# Patient Record
Sex: Female | Born: 1935 | Smoking: Never smoker
Health system: Southern US, Community
[De-identification: ages and names within clinical notes are randomized; demographics above are authoritative.]

## PROBLEM LIST (undated history)

## (undated) DIAGNOSIS — R519 Headache, unspecified: Secondary | ICD-10-CM

## (undated) DIAGNOSIS — R32 Unspecified urinary incontinence: Secondary | ICD-10-CM

## (undated) DIAGNOSIS — B019 Varicella without complication: Secondary | ICD-10-CM

## (undated) DIAGNOSIS — E785 Hyperlipidemia, unspecified: Secondary | ICD-10-CM

## (undated) DIAGNOSIS — I2510 Atherosclerotic heart disease of native coronary artery without angina pectoris: Secondary | ICD-10-CM

## (undated) DIAGNOSIS — E039 Hypothyroidism, unspecified: Secondary | ICD-10-CM

## (undated) DIAGNOSIS — I779 Disorder of arteries and arterioles, unspecified: Secondary | ICD-10-CM

## (undated) DIAGNOSIS — I00 Rheumatic fever without heart involvement: Secondary | ICD-10-CM

## (undated) DIAGNOSIS — J45909 Unspecified asthma, uncomplicated: Secondary | ICD-10-CM

## (undated) DIAGNOSIS — I10 Essential (primary) hypertension: Secondary | ICD-10-CM

## (undated) DIAGNOSIS — R011 Cardiac murmur, unspecified: Secondary | ICD-10-CM

## (undated) DIAGNOSIS — K219 Gastro-esophageal reflux disease without esophagitis: Secondary | ICD-10-CM

## (undated) DIAGNOSIS — E079 Disorder of thyroid, unspecified: Secondary | ICD-10-CM

## (undated) DIAGNOSIS — R51 Headache: Secondary | ICD-10-CM

## (undated) DIAGNOSIS — I5189 Other ill-defined heart diseases: Secondary | ICD-10-CM

## (undated) HISTORY — DX: Essential (primary) hypertension: I10

## (undated) HISTORY — DX: Gastro-esophageal reflux disease without esophagitis: K21.9

## (undated) HISTORY — PX: BREAST BIOPSY: SHX20

## (undated) HISTORY — DX: Unspecified urinary incontinence: R32

## (undated) HISTORY — PX: ABDOMINAL HYSTERECTOMY: SHX81

## (undated) HISTORY — DX: Varicella without complication: B01.9

## (undated) HISTORY — DX: Disorder of arteries and arterioles, unspecified: I77.9

## (undated) HISTORY — DX: Hypothyroidism, unspecified: E03.9

## (undated) HISTORY — DX: Unspecified asthma, uncomplicated: J45.909

## (undated) HISTORY — DX: Atherosclerotic heart disease of native coronary artery without angina pectoris: I25.10

## (undated) HISTORY — DX: Cardiac murmur, unspecified: R01.1

## (undated) HISTORY — DX: Headache: R51

## (undated) HISTORY — DX: Disorder of thyroid, unspecified: E07.9

## (undated) HISTORY — DX: Other ill-defined heart diseases: I51.89

## (undated) HISTORY — DX: Hyperlipidemia, unspecified: E78.5

## (undated) HISTORY — DX: Rheumatic fever without heart involvement: I00

## (undated) HISTORY — DX: Headache, unspecified: R51.9

## (undated) HISTORY — PX: APPENDECTOMY: SHX54

---

## 1979-04-14 HISTORY — PX: MASTECTOMY: SHX3

## 2016-04-03 DIAGNOSIS — R7301 Impaired fasting glucose: Secondary | ICD-10-CM | POA: Diagnosis not present

## 2016-04-03 DIAGNOSIS — Z79899 Other long term (current) drug therapy: Secondary | ICD-10-CM | POA: Diagnosis not present

## 2016-04-03 DIAGNOSIS — E039 Hypothyroidism, unspecified: Secondary | ICD-10-CM | POA: Diagnosis not present

## 2016-04-03 DIAGNOSIS — Z789 Other specified health status: Secondary | ICD-10-CM | POA: Diagnosis not present

## 2016-04-03 DIAGNOSIS — J209 Acute bronchitis, unspecified: Secondary | ICD-10-CM | POA: Diagnosis not present

## 2016-04-03 DIAGNOSIS — N289 Disorder of kidney and ureter, unspecified: Secondary | ICD-10-CM | POA: Diagnosis not present

## 2016-04-03 DIAGNOSIS — E785 Hyperlipidemia, unspecified: Secondary | ICD-10-CM | POA: Diagnosis not present

## 2016-04-03 DIAGNOSIS — Z0000 Encounter for general adult medical examination without abnormal findings: Secondary | ICD-10-CM | POA: Diagnosis not present

## 2016-05-08 DIAGNOSIS — K219 Gastro-esophageal reflux disease without esophagitis: Secondary | ICD-10-CM | POA: Diagnosis not present

## 2016-05-08 DIAGNOSIS — I6523 Occlusion and stenosis of bilateral carotid arteries: Secondary | ICD-10-CM | POA: Diagnosis not present

## 2016-05-08 DIAGNOSIS — M542 Cervicalgia: Secondary | ICD-10-CM | POA: Diagnosis not present

## 2016-05-08 DIAGNOSIS — Z23 Encounter for immunization: Secondary | ICD-10-CM | POA: Diagnosis not present

## 2016-05-08 DIAGNOSIS — E039 Hypothyroidism, unspecified: Secondary | ICD-10-CM | POA: Diagnosis not present

## 2016-05-08 DIAGNOSIS — Z0001 Encounter for general adult medical examination with abnormal findings: Secondary | ICD-10-CM | POA: Diagnosis not present

## 2016-05-08 DIAGNOSIS — E785 Hyperlipidemia, unspecified: Secondary | ICD-10-CM | POA: Diagnosis not present

## 2016-05-08 DIAGNOSIS — Z6829 Body mass index (BMI) 29.0-29.9, adult: Secondary | ICD-10-CM | POA: Diagnosis not present

## 2016-05-08 DIAGNOSIS — E663 Overweight: Secondary | ICD-10-CM | POA: Diagnosis not present

## 2016-05-08 DIAGNOSIS — Z1389 Encounter for screening for other disorder: Secondary | ICD-10-CM | POA: Diagnosis not present

## 2016-05-10 DIAGNOSIS — Z85828 Personal history of other malignant neoplasm of skin: Secondary | ICD-10-CM | POA: Diagnosis not present

## 2016-05-10 DIAGNOSIS — L720 Epidermal cyst: Secondary | ICD-10-CM | POA: Diagnosis not present

## 2016-05-10 DIAGNOSIS — L578 Other skin changes due to chronic exposure to nonionizing radiation: Secondary | ICD-10-CM | POA: Diagnosis not present

## 2016-05-10 DIAGNOSIS — D225 Melanocytic nevi of trunk: Secondary | ICD-10-CM | POA: Diagnosis not present

## 2016-05-10 DIAGNOSIS — L820 Inflamed seborrheic keratosis: Secondary | ICD-10-CM | POA: Diagnosis not present

## 2016-05-10 DIAGNOSIS — L570 Actinic keratosis: Secondary | ICD-10-CM | POA: Diagnosis not present

## 2016-07-18 DIAGNOSIS — I6521 Occlusion and stenosis of right carotid artery: Secondary | ICD-10-CM | POA: Diagnosis not present

## 2016-07-19 DIAGNOSIS — I6523 Occlusion and stenosis of bilateral carotid arteries: Secondary | ICD-10-CM | POA: Diagnosis not present

## 2016-10-03 DIAGNOSIS — R05 Cough: Secondary | ICD-10-CM | POA: Diagnosis not present

## 2016-10-03 DIAGNOSIS — F329 Major depressive disorder, single episode, unspecified: Secondary | ICD-10-CM | POA: Diagnosis not present

## 2016-10-03 DIAGNOSIS — E039 Hypothyroidism, unspecified: Secondary | ICD-10-CM | POA: Diagnosis not present

## 2016-10-03 DIAGNOSIS — E785 Hyperlipidemia, unspecified: Secondary | ICD-10-CM | POA: Diagnosis not present

## 2016-10-03 DIAGNOSIS — K219 Gastro-esophageal reflux disease without esophagitis: Secondary | ICD-10-CM | POA: Diagnosis not present

## 2016-10-13 DIAGNOSIS — J449 Chronic obstructive pulmonary disease, unspecified: Secondary | ICD-10-CM | POA: Diagnosis not present

## 2016-11-22 DIAGNOSIS — R0600 Dyspnea, unspecified: Secondary | ICD-10-CM | POA: Diagnosis not present

## 2016-11-22 DIAGNOSIS — R05 Cough: Secondary | ICD-10-CM | POA: Diagnosis not present

## 2016-12-08 DIAGNOSIS — L578 Other skin changes due to chronic exposure to nonionizing radiation: Secondary | ICD-10-CM | POA: Diagnosis not present

## 2016-12-08 DIAGNOSIS — L814 Other melanin hyperpigmentation: Secondary | ICD-10-CM | POA: Diagnosis not present

## 2016-12-08 DIAGNOSIS — D229 Melanocytic nevi, unspecified: Secondary | ICD-10-CM | POA: Diagnosis not present

## 2016-12-08 DIAGNOSIS — L821 Other seborrheic keratosis: Secondary | ICD-10-CM | POA: Diagnosis not present

## 2017-02-15 DIAGNOSIS — R05 Cough: Secondary | ICD-10-CM | POA: Diagnosis not present

## 2017-02-16 DIAGNOSIS — R05 Cough: Secondary | ICD-10-CM | POA: Diagnosis not present

## 2017-02-22 DIAGNOSIS — R112 Nausea with vomiting, unspecified: Secondary | ICD-10-CM | POA: Diagnosis not present

## 2017-02-22 DIAGNOSIS — I10 Essential (primary) hypertension: Secondary | ICD-10-CM | POA: Diagnosis not present

## 2017-02-22 DIAGNOSIS — R05 Cough: Secondary | ICD-10-CM | POA: Diagnosis not present

## 2017-02-27 DIAGNOSIS — K219 Gastro-esophageal reflux disease without esophagitis: Secondary | ICD-10-CM | POA: Diagnosis not present

## 2017-02-27 DIAGNOSIS — Z6829 Body mass index (BMI) 29.0-29.9, adult: Secondary | ICD-10-CM | POA: Diagnosis not present

## 2017-02-27 DIAGNOSIS — E663 Overweight: Secondary | ICD-10-CM | POA: Diagnosis not present

## 2017-02-27 DIAGNOSIS — I10 Essential (primary) hypertension: Secondary | ICD-10-CM | POA: Diagnosis not present

## 2017-03-30 DIAGNOSIS — I10 Essential (primary) hypertension: Secondary | ICD-10-CM | POA: Diagnosis not present

## 2017-03-30 DIAGNOSIS — E669 Obesity, unspecified: Secondary | ICD-10-CM | POA: Diagnosis not present

## 2017-03-30 DIAGNOSIS — Z6830 Body mass index (BMI) 30.0-30.9, adult: Secondary | ICD-10-CM | POA: Diagnosis not present

## 2017-04-04 DIAGNOSIS — Z0000 Encounter for general adult medical examination without abnormal findings: Secondary | ICD-10-CM | POA: Diagnosis not present

## 2017-04-04 DIAGNOSIS — E785 Hyperlipidemia, unspecified: Secondary | ICD-10-CM | POA: Diagnosis not present

## 2017-04-04 DIAGNOSIS — I10 Essential (primary) hypertension: Secondary | ICD-10-CM | POA: Diagnosis not present

## 2017-04-04 DIAGNOSIS — E039 Hypothyroidism, unspecified: Secondary | ICD-10-CM | POA: Diagnosis not present

## 2017-04-04 DIAGNOSIS — N289 Disorder of kidney and ureter, unspecified: Secondary | ICD-10-CM | POA: Diagnosis not present

## 2017-05-10 DIAGNOSIS — E785 Hyperlipidemia, unspecified: Secondary | ICD-10-CM | POA: Diagnosis not present

## 2017-05-10 DIAGNOSIS — I10 Essential (primary) hypertension: Secondary | ICD-10-CM | POA: Diagnosis not present

## 2017-05-10 DIAGNOSIS — E039 Hypothyroidism, unspecified: Secondary | ICD-10-CM | POA: Diagnosis not present

## 2017-05-10 DIAGNOSIS — Z0000 Encounter for general adult medical examination without abnormal findings: Secondary | ICD-10-CM | POA: Diagnosis not present

## 2017-12-10 ENCOUNTER — Ambulatory Visit (INDEPENDENT_AMBULATORY_CARE_PROVIDER_SITE_OTHER): Payer: Medicare Other | Admitting: Family Medicine

## 2017-12-10 ENCOUNTER — Ambulatory Visit (INDEPENDENT_AMBULATORY_CARE_PROVIDER_SITE_OTHER): Payer: Medicare Other

## 2017-12-10 ENCOUNTER — Encounter: Payer: Self-pay | Admitting: Family Medicine

## 2017-12-10 VITALS — BP 154/90 | HR 64 | Temp 98.4°F | Ht 65.5 in | Wt 178.4 lb

## 2017-12-10 DIAGNOSIS — K219 Gastro-esophageal reflux disease without esophagitis: Secondary | ICD-10-CM | POA: Diagnosis not present

## 2017-12-10 DIAGNOSIS — I10 Essential (primary) hypertension: Secondary | ICD-10-CM | POA: Diagnosis not present

## 2017-12-10 DIAGNOSIS — M25562 Pain in left knee: Secondary | ICD-10-CM

## 2017-12-10 DIAGNOSIS — E785 Hyperlipidemia, unspecified: Secondary | ICD-10-CM | POA: Diagnosis not present

## 2017-12-10 DIAGNOSIS — Z23 Encounter for immunization: Secondary | ICD-10-CM

## 2017-12-10 DIAGNOSIS — G8929 Other chronic pain: Secondary | ICD-10-CM | POA: Diagnosis not present

## 2017-12-10 DIAGNOSIS — E039 Hypothyroidism, unspecified: Secondary | ICD-10-CM

## 2017-12-10 DIAGNOSIS — M1712 Unilateral primary osteoarthritis, left knee: Secondary | ICD-10-CM | POA: Diagnosis not present

## 2017-12-10 LAB — COMPREHENSIVE METABOLIC PANEL
ALK PHOS: 61 U/L (ref 39–117)
ALT: 27 U/L (ref 0–35)
AST: 37 U/L (ref 0–37)
Albumin: 4.4 g/dL (ref 3.5–5.2)
BUN: 18 mg/dL (ref 6–23)
CO2: 30 mEq/L (ref 19–32)
CREATININE: 0.86 mg/dL (ref 0.40–1.20)
Calcium: 10.1 mg/dL (ref 8.4–10.5)
Chloride: 102 mEq/L (ref 96–112)
GFR: 67.05 mL/min (ref >60.00)
GLUCOSE: 99 mg/dL (ref 70–99)
POTASSIUM: 4.4 meq/L (ref 3.5–5.1)
SODIUM: 140 meq/L (ref 135–145)
TOTAL PROTEIN: 7.3 g/dL (ref 6.0–8.3)
Total Bilirubin: 0.7 mg/dL (ref 0.2–1.2)

## 2017-12-10 LAB — CBC WITH DIFFERENTIAL/PLATELET
BASOS ABS: 0 10*3/uL (ref 0.0–0.1)
Basophils Relative: 0.9 % (ref 0.0–3.0)
Eosinophils Absolute: 0.4 10*3/uL (ref 0.0–0.7)
Eosinophils Relative: 6.8 % — ABNORMAL HIGH (ref 0.0–5.0)
HCT: 46.8 % — ABNORMAL HIGH (ref 36.0–46.0)
HEMOGLOBIN: 15.8 g/dL — AB (ref 12.0–15.0)
LYMPHS PCT: 30.9 % (ref 12.0–46.0)
Lymphs Abs: 1.7 10*3/uL (ref 0.7–4.0)
MCHC: 33.8 g/dL (ref 30.0–36.0)
MCV: 88 fl (ref 78.0–100.0)
MONO ABS: 0.4 10*3/uL (ref 0.1–1.0)
Monocytes Relative: 6.9 % (ref 3.0–12.0)
Neutro Abs: 2.9 10*3/uL (ref 1.4–7.7)
Neutrophils Relative %: 54.5 % (ref 43.0–77.0)
Platelets: 201 10*3/uL (ref 150.0–400.0)
RBC: 5.31 Mil/uL — AB (ref 3.87–5.11)
RDW: 13.3 % (ref 11.5–15.5)
WBC: 5.4 10*3/uL (ref 4.0–10.5)

## 2017-12-10 LAB — LIPID PANEL
CHOL/HDL RATIO: 3
Cholesterol: 189 mg/dL (ref 0–200)
HDL: 58.2 mg/dL (ref >39.00)
NonHDL: 130.59
Triglycerides: 204 mg/dL — ABNORMAL HIGH (ref 0.0–149.0)
VLDL: 40.8 mg/dL — AB (ref 0.0–40.0)

## 2017-12-10 LAB — TSH: TSH: 1.95 u[IU]/mL (ref 0.35–4.50)

## 2017-12-10 LAB — LDL CHOLESTEROL, DIRECT: Direct LDL: 103 mg/dL

## 2017-12-10 MED ORDER — DICLOFENAC SODIUM 1 % TD GEL: 2.0000 g | Freq: Four times a day (QID) | TRANSDERMAL | 2 refills | Status: DC

## 2017-12-10 MED ORDER — RABEPRAZOLE SODIUM 20 MG PO TBEC: 20.0000 mg | DELAYED_RELEASE_TABLET | Freq: Every day | ORAL | 3 refills | Status: DC

## 2017-12-10 MED ORDER — LOSARTAN POTASSIUM 50 MG PO TABS: 50.0000 mg | ORAL_TABLET | Freq: Every day | ORAL | 3 refills | Status: DC

## 2017-12-10 MED ORDER — TRAMADOL HCL 50 MG PO TABS: 50.0000 mg | ORAL_TABLET | Freq: Four times a day (QID) | ORAL | 0 refills | Status: DC | PRN

## 2017-12-10 MED ORDER — ASPIRIN EC 325 MG PO TBEC: 325.0000 mg | DELAYED_RELEASE_TABLET | Freq: Every day | ORAL | 3 refills | Status: DC

## 2017-12-10 MED ORDER — FENOFIBRATE 160 MG PO TABS: 160.0000 mg | ORAL_TABLET | Freq: Every day | ORAL | 3 refills | Status: DC

## 2017-12-10 MED ORDER — LEVOTHYROXINE SODIUM 75 MCG PO TABS: 75.0000 ug | ORAL_TABLET | Freq: Every day | ORAL | 3 refills | Status: DC

## 2017-12-10 NOTE — Progress Notes (Signed)
Subjective:    Patient ID: Christine [name], female    DOB: September 03, 1933, 84 y.o.   MRN: [id number]  HPI   Patient presents to clinic to establish primary care with new PCP.  She recently moved to area from [place] to be closer to her family.  She has a past medical history of hypothyroidism, hyperlipidemia, GERD, hypertension.  Needs all medications refilled.  Patient's medical history, social history, surgical history, family history reviewed and updated accordingly in our chart.  Patient's main concern today is aching left knee pain, states at times it is so severe she cannot get good sleep.  Also would like her flu vaccine today.  Patient Active Problem List   Diagnosis Date Noted  . Chronic pain of left knee 12/10/2017  . Essential hypertension 12/10/2017  . Acquired hypothyroidism 12/10/2017  . GERD without esophagitis 12/10/2017  . Hyperlipidemia 12/10/2017   Social History   Tobacco Use  . Smoking status: Never Smoker  . Smokeless tobacco: Never Used  Substance Use Topics  . Alcohol use: Not on file   Family History  Problem Relation Age of Onset  . Early death Mother   . Kidney disease Mother   . Cancer Father   . Heart disease Brother   . Cancer Daughter    Past Surgical History:  Procedure Laterality Date  . ABDOMINAL HYSTERECTOMY    . APPENDECTOMY    . BREAST BIOPSY      Review of Systems  Constitutional: Negative for chills, fatigue and fever.  HENT: Negative for congestion, ear pain, sinus pain and sore throat.   Eyes: Negative.   Respiratory: Negative for cough, shortness of breath and wheezing.   Cardiovascular: Negative for chest pain, palpitations and leg swelling.  Gastrointestinal: Negative for abdominal pain, diarrhea, nausea and vomiting.  Genitourinary: Negative for dysuria, frequency and urgency.  Musculoskeletal: left knee pain Skin: Negative for color change, pallor and rash.  Neurological: Negative for syncope,  light-headedness and headaches.  Psychiatric/Behavioral: The patient is not nervous/anxious.       Objective:   Physical Exam  Constitutional: She is oriented to person, place, and time. She appears well-nourished. No distress.  HENT:  Head: Normocephalic and atraumatic.  Eyes: Pupils are equal, round, and reactive to light. Conjunctivae and EOM are normal. No scleral icterus.  Neck: Normal range of motion. Neck supple. No JVD present. No tracheal deviation present.  Cardiovascular: Normal rate and regular rhythm. Exam reveals no gallop and no friction rub.  No murmur heard. Pulmonary/Chest: Effort normal and breath sounds normal.  Musculoskeletal: Normal range of motion. She exhibits tenderness. She exhibits no edema or deformity.  Left knee pain. ROM knee intact - can extend and bend without issues. Negative anterior/posterior drawer test. No visible swelling of knee.   Neurological: She is alert and oriented to person, place, and time.  Skin: Skin is warm and dry. No erythema. No pallor.  Psychiatric: She has a normal mood and affect. Her behavior is normal. Thought content normal.  Nursing note and vitals reviewed.   Vitals:   12/10/17 1016 12/10/17 1050  BP: (!) 170/102 (!) 154/90  Pulse: 64   Temp: 98.4 F (36.9 C)   SpO2: 96%       Assessment & Plan:   Left knee pain-overall get x-ray to further evaluate.  I suspect the pain is related to arthritis.  Patient will continue to use Tylenol as needed for pain.  Patient also given Voltaren gel to rub on  knee to see if this helps soothe pain.  Tramadol 50 mg given to use when needed for moderate to severe pain.  Essential hypertension- patient will continue losartan 50 mg.  We will get CBC and CMP labs drawn today.  Hypothyroidism-she will continue levothyroxine 75 mcg.  We will get new TSH level drawn today.  GERD- patient will continue AcipHex 20 mg.  Hyperlipidemia- patient will continue fenofibrate 160 mg once daily.  New  lipid panel drawn in clinic today.  Flu vaccine given in clinic.  Follow up in 3 months for management of chronic conditions. Once we have knee xray results, we can consider orthopedic referral if arthritis is severe.

## 2017-12-31 ENCOUNTER — Other Ambulatory Visit: Payer: Self-pay | Admitting: Family Medicine

## 2017-12-31 ENCOUNTER — Ambulatory Visit (INDEPENDENT_AMBULATORY_CARE_PROVIDER_SITE_OTHER): Payer: Medicare Other | Admitting: Family Medicine

## 2017-12-31 ENCOUNTER — Encounter: Payer: Self-pay | Admitting: Family Medicine

## 2017-12-31 VITALS — BP 138/66 | HR 73 | Temp 97.8°F | Ht 65.0 in | Wt 169.8 lb

## 2017-12-31 DIAGNOSIS — R519 Headache, unspecified: Secondary | ICD-10-CM

## 2017-12-31 DIAGNOSIS — R0982 Postnasal drip: Secondary | ICD-10-CM

## 2017-12-31 DIAGNOSIS — K219 Gastro-esophageal reflux disease without esophagitis: Secondary | ICD-10-CM | POA: Diagnosis not present

## 2017-12-31 DIAGNOSIS — R142 Eructation: Secondary | ICD-10-CM | POA: Diagnosis not present

## 2017-12-31 DIAGNOSIS — R5383 Other fatigue: Secondary | ICD-10-CM

## 2017-12-31 DIAGNOSIS — R51 Headache: Secondary | ICD-10-CM

## 2017-12-31 MED ORDER — LORATADINE 10 MG PO TABS: 10.0000 mg | ORAL_TABLET | Freq: Every day | ORAL | 1 refills | Status: DC

## 2017-12-31 MED ORDER — OMEPRAZOLE 40 MG PO CPDR: 40.0000 mg | DELAYED_RELEASE_CAPSULE | Freq: Every day | ORAL | 1 refills | Status: DC

## 2017-12-31 MED ORDER — FAMOTIDINE 20 MG PO TABS: 20.0000 mg | ORAL_TABLET | Freq: Every day | ORAL | 1 refills | Status: DC

## 2017-12-31 NOTE — Progress Notes (Signed)
Subjective:    Patient ID: Christine [name], female    DOB: 1933-07-22, 84 y.o.   MRN: [id number]  HPI   Patient presents to clinic c/o burping, headache (fullness, pressure in front of head) for past 2 weeks.   She has taken aciphex for years due to hiatal hernia and GERD. States her diet has not changed. Denies any feelings of burning in chest, just increased burping.   States she will use a tylenol and ice - pack for her headache, which does help some. She has also been sniffling more often.   Most recent lab work done 12/10/17 was unremarkable for abnormality -- labs done included cbc, cmp, lipid panel, TSH.  Patient Active Problem List   Diagnosis Date Noted  . Chronic pain of left knee 12/10/2017  . Essential hypertension 12/10/2017  . Acquired hypothyroidism 12/10/2017  . GERD without esophagitis 12/10/2017  . Hyperlipidemia 12/10/2017   Social History   Tobacco Use  . Smoking status: Never Smoker  . Smokeless tobacco: Never Used  Substance Use Topics  . Alcohol use: Yes    Comment: seldom   Review of Systems   Constitutional: +fatigue. Negative for chills, and fever.  HENT: Negative for congestion, ear pain, sinus pain and sore throat.   Eyes: Negative.   Respiratory: Negative for cough, shortness of breath and wheezing.   Cardiovascular: Negative for chest pain, palpitations and leg swelling.  Gastrointestinal: Negative for abdominal pain, diarrhea, nausea and vomiting. +increased burping.  Genitourinary: Negative for dysuria, frequency and urgency.  Musculoskeletal: Negative for arthralgias and myalgias.  Skin: Negative for color change, pallor and rash.  Neurological: Negative for syncope, light-headedness. +headache Psychiatric/Behavioral: The patient is not nervous/anxious.       Objective:   Physical Exam  Constitutional: She is oriented to person, place, and time. She appears well-nourished. No distress.  HENT:  Head: Normocephalic and atraumatic.    Nose: Mucosal edema and rhinorrhea present.  +fullness bilateral TMs. +post nasal drip. +clear nasal drainage.   Eyes: EOM are normal. No scleral icterus.  Neck: Neck supple. No JVD present. No tracheal deviation present.  Cardiovascular: Normal rate, regular rhythm and normal heart sounds.  Pulmonary/Chest: Effort normal and breath sounds normal. No respiratory distress. She has no wheezes. She has no rales.  Abdominal: Soft. Bowel sounds are normal. She exhibits no distension. There is no tenderness.  Musculoskeletal: She exhibits no edema.  Gait normal.   Lymphadenopathy:    She has no cervical adenopathy.  Neurological: She is alert and oriented to person, place, and time.  Skin: Skin is warm and dry. No pallor.  Psychiatric: She has a normal mood and affect. Her behavior is normal.  Nursing note and vitals reviewed.     Vitals:   12/31/17 1059  BP: 138/66  Pulse: 73  Temp: 97.8 F (36.6 C)  SpO2: 96%   Assessment & Plan:   Belching/GERD -- We will stop aciphex. We will change to omeprazole 40mg  in AM AND Pepcid 20mg  in the PM. If this does not help, we will proceed with GI referral.   Post nasal drip/sinus headache -- due to postnasal drip seen on exam and ear fullness bilaterally I suspect headache is related to a sinus headache.  Patient advised she can continue use Tylenol ice pack as needed.  Also she will start loratadine 10 mg once daily to help with congestion.  If headache continues to persist even with addition of loratadine, we discussed options of getting imaging  of brain.  Fatigue - most recent lab work done at the end of September was unremarkable.  CBC, CMP, thyroid panel were all good.  Fatigue could be related to postnasal drip/sinus headache.  If fatigue persists even after treatment of these things, we will do more lab work including vitamin levels to further evaluate.  Patient will follow-up here in 2 to 4 weeks for recheck after starting these new  medications.  Patient advised she can call clinic at any time if issues arise.

## 2017-12-31 NOTE — Patient Instructions (Signed)

## 2018-01-18 ENCOUNTER — Ambulatory Visit (INDEPENDENT_AMBULATORY_CARE_PROVIDER_SITE_OTHER): Payer: Medicare Other | Admitting: Family Medicine

## 2018-01-18 ENCOUNTER — Encounter: Payer: Self-pay | Admitting: Family Medicine

## 2018-01-18 ENCOUNTER — Other Ambulatory Visit: Payer: Self-pay | Admitting: Family Medicine

## 2018-01-18 VITALS — BP 158/82 | HR 67 | Temp 97.9°F | Ht 65.0 in | Wt 174.4 lb

## 2018-01-18 DIAGNOSIS — I10 Essential (primary) hypertension: Secondary | ICD-10-CM

## 2018-01-18 DIAGNOSIS — R0982 Postnasal drip: Secondary | ICD-10-CM | POA: Diagnosis not present

## 2018-01-18 DIAGNOSIS — K219 Gastro-esophageal reflux disease without esophagitis: Secondary | ICD-10-CM | POA: Diagnosis not present

## 2018-01-18 DIAGNOSIS — R110 Nausea: Secondary | ICD-10-CM | POA: Diagnosis not present

## 2018-01-18 DIAGNOSIS — R51 Headache: Secondary | ICD-10-CM

## 2018-01-18 DIAGNOSIS — R142 Eructation: Secondary | ICD-10-CM | POA: Diagnosis not present

## 2018-01-18 DIAGNOSIS — R519 Headache, unspecified: Secondary | ICD-10-CM

## 2018-01-18 MED ORDER — ONDANSETRON 4 MG PO TBDP: 4.0000 mg | ORAL_TABLET | Freq: Three times a day (TID) | ORAL | 1 refills | Status: DC | PRN

## 2018-01-18 MED ORDER — LOSARTAN POTASSIUM 100 MG PO TABS: 100.0000 mg | ORAL_TABLET | Freq: Every day | ORAL | 1 refills | Status: DC

## 2018-01-18 NOTE — Progress Notes (Signed)
   Subjective:    Patient ID: Christine [name], female    DOB: Oct 30, 1933, 84 y.o.   MRN: [id number]  HPI  Presents to clinic for 2 week follow up after changing medications for GERD to pepcid and omeprazole due to increased belching and adding loratadine 10 to treat runny nose/sinus headache.  Patient states the headache and runny nose has improved with addition of Claritin, states she is feeling much better in that regard.  Belching improved, However she continues to have episodes of nausea even with medication changes for her GERD treatment.   Patient also has been monitoring BP at home, and has been running on the higher end in the 150s over 90s.  Currently she takes losartan 50 mg once daily for blood pressure control.  Patient Active Problem List   Diagnosis Date Noted  . Chronic pain of left knee 12/10/2017  . Essential hypertension 12/10/2017  . Acquired hypothyroidism 12/10/2017  . GERD without esophagitis 12/10/2017  . Hyperlipidemia 12/10/2017   Social History   Tobacco Use  . Smoking status: Never Smoker  . Smokeless tobacco: Never Used  Substance Use Topics  . Alcohol use: Yes    Comment: seldom   Review of Systems  Constitutional: Negative for chills, fatigue and fever.  HENT: Negative for congestion, ear pain, sinus pain and sore throat.   Eyes: Negative.   Respiratory: Negative for cough, shortness of breath and wheezing.   Cardiovascular: Negative for chest pain, palpitations and leg swelling.  Gastrointestinal: +nausea. Negative for abdominal pain, diarrhea, and vomiting.  Genitourinary: Negative for dysuria, frequency and urgency.  Musculoskeletal: Negative for arthralgias and myalgias.  Skin: Negative for color change, pallor and rash.  Neurological: Negative for syncope, light-headedness and headaches.  Psychiatric/Behavioral: The patient is not nervous/anxious.       Objective:   Physical Exam  Constitutional: She appears well-developed and  well-nourished. No distress.  Head: Normocephalic and atraumatic.  Eyes: EOM are normal. No scleral icterus.  Neck: Normal range of motion. Neck supple. No tracheal deviation present.  Cardiovascular: Normal rate, regular rhythm and normal heart sounds. No LE swelling. Pulmonary/Chest: Effort normal and breath sounds normal. No respiratory distress. She has no wheezes. She has no rales.  Abdominal: Soft. Bowel sounds are normal. Mild epigastric tenderness.  Neurological: She is alert and oriented to person, place, and time.  Gait normal  Skin: Skin is warm and dry. No pallor.  Psychiatric: She has a normal mood and affect. Her behavior is normal. Thought content normal.   Nursing note and vitals reviewed.    Vitals:   01/18/18 1055  BP: (!) 158/82  Pulse: 67  Temp: 97.9 F (36.6 C)  SpO2: 94%   Assessment & Plan:   GERD/nausea/belching - patient will continue omeprazole 40 mg daily and Pepcid 20 mg daily.  We will also do referral to GI due to continued issues with GERD and nausea symptoms.  Patient given Zofran ODT to use as needed if she has nausea, patient aware this medication dissolves under the tongue.  Sinus headache/postnasal drip- patient will continue Claritin, this has been helpful to try postnasal drip symptoms.  Hypertension - blood pressures have been trending high, we will increase losartan 100 mg/day.  Patient will return to clinic in 2 weeks for nurse visit on blood pressure to see if increase in losartan was effective.  If increased dose is effective, we will send prescription to her mail away pharmacy.

## 2018-01-25 ENCOUNTER — Encounter: Payer: Self-pay | Admitting: Gastroenterology

## 2018-01-31 ENCOUNTER — Ambulatory Visit (INDEPENDENT_AMBULATORY_CARE_PROVIDER_SITE_OTHER): Payer: Medicare Other | Admitting: *Deleted

## 2018-01-31 VITALS — BP 146/84 | HR 66 | Resp 18

## 2018-01-31 DIAGNOSIS — I10 Essential (primary) hypertension: Secondary | ICD-10-CM | POA: Diagnosis not present

## 2018-01-31 MED ORDER — AMLODIPINE BESYLATE 5 MG PO TABS: 5.0000 mg | ORAL_TABLET | Freq: Every day | ORAL | 0 refills | Status: DC

## 2018-01-31 NOTE — Progress Notes (Addendum)
Reviewed. BP very minimally improved.   We will add amlodipine 5 mg to medication regime and please schedule her for follow up in 7-10 days as an office visit with me.  Thanks  Philis Nettle FNP

## 2018-01-31 NOTE — Progress Notes (Signed)
Patient here for nurse visit BP check per order from 01/18/18   Patient reports compliance with prescribed BP medications: yes losartan 100 mg   Last dose of BP medication: 0745  BP Readings from Last 3 Encounters:  01/31/18 (!) 150/74  01/18/18 (!) 158/82  12/31/17 138/66   Pulse Readings from Last 3 Encounters:  01/31/18 64  01/18/18 67  12/31/17 73    .   Kerin Salen, LPN

## 2018-01-31 NOTE — Addendum Note (Signed)
Addended by: Philis Nettle on: 01/31/2018 04:58 PM   Modules accepted: Orders

## 2018-02-05 ENCOUNTER — Telehealth: Payer: Self-pay

## 2018-02-05 NOTE — Progress Notes (Signed)
See phone note dated 02/05/18

## 2018-02-05 NOTE — Telephone Encounter (Signed)
Patient is leaving for [facility] Saturday and will not be back in town until 02/24/18, scheduled Follow up for 02/25/18 and advised patient to just periodically monitor pressure and advise office any problems.

## 2018-02-05 NOTE — Telephone Encounter (Signed)
I did not call her, maybe Juliann Pulse did? In regards to her new BP medication from BP visit check last week

## 2018-02-05 NOTE — Telephone Encounter (Signed)
Copied from [place] [phone]. Topic: General - Call Back - No Documentation >> Feb 05, 2018  2:28 PM Reyne Dumas L wrote: Reason for CRM:  Pt states that she missed a call from Philis Nettle, that just stated to call back.

## 2018-02-05 NOTE — Progress Notes (Signed)
Called patient left message for patient to please call office.

## 2018-02-05 NOTE — Telephone Encounter (Signed)
Did she pick up her new BP med amlodipine?

## 2018-02-06 NOTE — Telephone Encounter (Signed)
Yes she is going to .

## 2018-02-25 ENCOUNTER — Other Ambulatory Visit: Payer: Self-pay | Admitting: Family Medicine

## 2018-02-25 ENCOUNTER — Encounter: Payer: Self-pay | Admitting: Family Medicine

## 2018-02-25 ENCOUNTER — Ambulatory Visit (INDEPENDENT_AMBULATORY_CARE_PROVIDER_SITE_OTHER): Payer: Medicare Other | Admitting: Family Medicine

## 2018-02-25 VITALS — BP 156/84 | HR 64 | Temp 97.5°F | Ht 65.0 in | Wt 177.6 lb

## 2018-02-25 DIAGNOSIS — M1712 Unilateral primary osteoarthritis, left knee: Secondary | ICD-10-CM | POA: Diagnosis not present

## 2018-02-25 DIAGNOSIS — G8929 Other chronic pain: Secondary | ICD-10-CM

## 2018-02-25 DIAGNOSIS — I10 Essential (primary) hypertension: Secondary | ICD-10-CM | POA: Diagnosis not present

## 2018-02-25 DIAGNOSIS — M25562 Pain in left knee: Secondary | ICD-10-CM

## 2018-02-25 LAB — BASIC METABOLIC PANEL
BUN: 26 mg/dL — ABNORMAL HIGH (ref 6–23)
CHLORIDE: 104 meq/L (ref 96–112)
CO2: 26 mEq/L (ref 19–32)
CREATININE: 0.89 mg/dL (ref 0.40–1.20)
Calcium: 9.8 mg/dL (ref 8.4–10.5)
GFR: 64.42 mL/min (ref >60.00)
Glucose, Bld: 97 mg/dL (ref 70–99)
Potassium: 4.5 mEq/L (ref 3.5–5.1)
Sodium: 138 mEq/L (ref 135–145)

## 2018-02-25 MED ORDER — TRAMADOL HCL 50 MG PO TABS: 50.0000 mg | ORAL_TABLET | Freq: Four times a day (QID) | ORAL | 0 refills | Status: DC | PRN

## 2018-02-25 MED ORDER — AMLODIPINE BESYLATE 10 MG PO TABS: 10.0000 mg | ORAL_TABLET | Freq: Every day | ORAL | 1 refills | Status: DC

## 2018-02-25 NOTE — Progress Notes (Signed)
Subjective:    Patient ID: Christine [name], female    DOB: 1933-04-10, 84 y.o.   MRN: [id number]  HPI  Presents to clinic for follow up on BP after adding amlodipine.   Patient does have daily members who are nurses, and they have been monitoring her blood pressure for her.  Since adding the amlodipine onto the losartan, her pressures have been running in the 140s over 60s to 150s over 80s fairly consistently.  Patient denies any issues with amlodipine, denies chest pain/shortness of breath/lower extremity swelling.  Patient does report some flareup of her left knee pain.  She recently flew on an airplane to and from [place], and states after getting off a flight her knee was aching terribly.  We have done a knee x-ray in the past, and it did show moderate compartmental changes consistent with arthritis.  Patient Active Problem List   Diagnosis Date Noted  . Chronic pain of left knee 12/10/2017  . Essential hypertension 12/10/2017  . Acquired hypothyroidism 12/10/2017  . GERD without esophagitis 12/10/2017  . Hyperlipidemia 12/10/2017   Social History   Tobacco Use  . Smoking status: Never Smoker  . Smokeless tobacco: Never Used  Substance Use Topics  . Alcohol use: Yes    Comment: seldom    Review of Systems  Constitutional: Negative for chills, fatigue and fever.  HENT: Negative for congestion, ear pain, sinus pain and sore throat.   Eyes: Negative.   Respiratory: Negative for cough, shortness of breath and wheezing.   Cardiovascular: Negative for chest pain, palpitations and leg swelling.  Gastrointestinal: Negative for abdominal pain, diarrhea, nausea and vomiting.  Genitourinary: Negative for dysuria, frequency and urgency.  Musculoskeletal: +left knee pain Skin: Negative for color change, pallor and rash.  Neurological: Negative for syncope, light-headedness and headaches.  Psychiatric/Behavioral: The patient is not nervous/anxious.       Objective:   Physical  Exam  Constitutional:  She appears well-developed and well-nourished. No distress.  HENT:  Head: Normocephalic and atraumatic.  Eyes: Pupils are equal, round, and reactive to light. EOM are normal. No scleral icterus.  Neck: Normal range of motion. Neck supple. No tracheal deviation present.  Cardiovascular: Normal rate, regular rhythm and normal heart sounds. No LE edema. No carotid bruit. Pulmonary/Chest: Effort normal and breath sounds normal. No respiratory distress. She has no wheezes. She has no rales.  Neurological: She is alert and oriented to person, place, and time.  Musculoskeletal: Gait normal. ROM both knees intact, pain in left knee at extreme limits of range.  Skin: Skin is warm and dry. No pallor.  Psychiatric: She has a normal mood and affect. Her behavior is normal. Thought content normal.   Nursing note and vitals reviewed.   BP Readings from Last 3 Encounters:  02/25/18 (!) 162/84  01/31/18 (!) 146/84  01/18/18 (!) 158/82   Vitals:   02/25/18 1058 02/25/18 1115  BP: (!) 162/84 (!) 156/84  Pulse: 64   Temp: (!) 97.5 F (36.4 C)   SpO2: 96%       Assessment & Plan:   Essential hypertension - blood pressure remains elevated even with addition of amlodipine 5 mg, will try increasing amlodipine in addition to 10 mg/day.  We will also get new metabolic panel in clinic today.  Left knee pain/osteoarthritis - patient advised to use Tylenol as needed for knee pain also given tramadol 50 mg to use if Tylenol not effective.  Patient is agreeable to seeing orthopedics due to pain  and moderate arthritis seen on past x-ray.  Patient will follow-up here in 2 weeks for recheck of blood pressure with nurse and we will determine next step in plan of care after that nurse visit BP check.

## 2018-02-25 NOTE — Patient Instructions (Addendum)
Increase amlodipine to 10mg  per day  Come back in 2 weeks for BP check  You can take up to a maximum of 3000 mg of tylenol in 24 hour period. Try tylenol dose first for knee pain, if not helping can take a dose of tramadol

## 2018-02-26 ENCOUNTER — Ambulatory Visit (INDEPENDENT_AMBULATORY_CARE_PROVIDER_SITE_OTHER): Payer: Medicare Other | Admitting: Gastroenterology

## 2018-02-26 ENCOUNTER — Encounter: Payer: Self-pay | Admitting: Gastroenterology

## 2018-02-26 VITALS — BP 150/82 | HR 72 | Ht 65.0 in | Wt 176.2 lb

## 2018-02-26 DIAGNOSIS — R142 Eructation: Secondary | ICD-10-CM | POA: Diagnosis not present

## 2018-02-26 DIAGNOSIS — K219 Gastro-esophageal reflux disease without esophagitis: Secondary | ICD-10-CM | POA: Diagnosis not present

## 2018-02-26 NOTE — Progress Notes (Signed)
HPI :  84 year old female with a history of hypertension, hyperlipidemia, GERD, referred here by Jodelle Green, FNP for evaluation of reflux and belching.  She states she moved here from [place] over the summer after her husband passed away. She has been on chronic PPI use for years for symptoms of reflux. She is historically taken AcipHex for several years. When she moved to [facility] she reports her reflux symptoms bothering her significantly. She had spontaneous belching, worsening pyrosis, epigastric discomfort and nausea for several weeks. She denied any vomiting or regurgitation. She was switched from AcipHex to omeprazole 40 mg once a day and Pepcid 29 g once a day. Since being on this regimen she has had significant improvement of symptoms and she is not having much of any breakthrough. She denies any dysphagia. She is not having any vomiting. She endorsed an episode of hematemesis in 2015 in the setting of another illness, she is not had anything like that since that time. She denies any blood in her stools.   She denies any family history of colon cancer, esophageal cancer, or gastric cancer.  While living in [place], she states she had an EGD in 2009 which showed a small hiatal hernia and no evidence of Barrett's esophagus. She also had a colonoscopy at that time, done for rectal bleeding, which for which she was told it was due to hemorrhoids, denies any polyps that were removed. She endorsed years previously of having small-volume bleeding related to hemorrhoids. She has not had any problems from her hemorrhoids or any bleeding for the past year. Her hemoglobin is normal.   Otherwise she endorses gaining 15 pounds or so when she moved to [facility] which correlated with the time of her worsening reflux symptoms.   Past Medical History:  Diagnosis Date  . Asthma   . Chicken pox   . Frequent headaches   . GERD (gastroesophageal reflux disease)   . Heart murmur   . Hyperlipidemia    . Hypertension   . Rheumatic fever   . Thyroid disorder   . Urine incontinence      Past Surgical History:  Procedure Laterality Date  . ABDOMINAL HYSTERECTOMY    . APPENDECTOMY    . BREAST BIOPSY     Family History  Problem Relation Age of Onset  . Early death Mother   . Kidney disease Mother   . Cancer Father   . Heart disease Brother   . Cancer Daughter    Social History   Tobacco Use  . Smoking status: Never Smoker  . Smokeless tobacco: Never Used  Substance Use Topics  . Alcohol use: Yes    Comment: seldom  . Drug use: Never   Current Outpatient Medications  Medication Sig Dispense Refill  . amLODipine (NORVASC) 10 MG tablet TAKE 1 TABLET(10 MG) BY MOUTH DAILY 90 tablet 1  . aspirin EC 325 MG tablet Take 1 tablet (325 mg total) by mouth daily. 90 tablet 3  . famotidine (PEPCID) 20 MG tablet TAKE 1 TABLET(20 MG) BY MOUTH AT BEDTIME 90 tablet 1  . fenofibrate 160 MG tablet Take 1 tablet (160 mg total) by mouth daily. 90 tablet 3  . Fluticasone Propionate, Inhal, (FLOVENT DISKUS) 250 MCG/BLIST AEPB Inhale into the lungs.    Marland Kitchen levothyroxine (SYNTHROID, LEVOTHROID) 75 MCG tablet Take 1 tablet (75 mcg total) by mouth daily. 90 tablet 3  . loratadine (CLARITIN) 10 MG tablet Take 1 tablet (10 mg total) by mouth daily. 90 tablet  1  . losartan (COZAAR) 100 MG tablet TAKE 1 TABLET(100 MG) BY MOUTH DAILY 90 tablet 1  . omeprazole (PRILOSEC) 40 MG capsule TAKE 1 CAPSULE(40 MG) BY MOUTH DAILY 90 capsule 1  . traMADol (ULTRAM) 50 MG tablet Take 1 tablet (50 mg total) by mouth every 6 (six) hours as needed. 30 tablet 0   No current facility-administered medications for this visit.    Allergies  Allergen Reactions  . Pollen Extract      Review of Systems: All systems reviewed and negative except where noted in HPI.   Lab Results  Component Value Date   WBC 5.4 12/10/2017   HGB 15.8 (H) 12/10/2017   HCT 46.8 (H) 12/10/2017   MCV 88.0 12/10/2017   PLT 201.0  12/10/2017    Lab Results  Component Value Date   CREATININE 0.89 02/25/2018   BUN 26 (H) 02/25/2018   NA 138 02/25/2018   K 4.5 02/25/2018   CL 104 02/25/2018   CO2 26 02/25/2018    Lab Results  Component Value Date   ALT 27 12/10/2017   AST 37 12/10/2017   ALKPHOS 61 12/10/2017   BILITOT 0.7 12/10/2017     Physical Exam: BP (!) 150/82   Pulse 72   Ht 5\' 5"  (1.651 m)   Wt 176 lb 3.2 oz (79.9 kg)   SpO2 98%   BMI 29.32 kg/m  Constitutional: Pleasant,well-developed,female in no acute distress. HEENT: Normocephalic and atraumatic. Conjunctivae are normal. No scleral icterus. Neck supple.  Cardiovascular: Normal rate, regular rhythm.  Pulmonary/chest: Effort normal and breath sounds normal. No wheezing, rales or rhonchi. Abdominal: Soft, nondistended, nontender. . There are no masses palpable. No hepatomegaly. Extremities: no edema Lymphadenopathy: No cervical adenopathy noted. Neurological: Alert and oriented to person place and time. Skin: Skin is warm and dry. No rashes noted. Psychiatric: Normal mood and affect. Behavior is normal.   ASSESSMENT AND PLAN: 84 year old female here for new patient assessment the following issues:  GERD / belching - symptoms for years requiring PPI therapy, appears to have had worsening symptoms when she moved to [facility] in the setting of weight gain. Now her symptoms are well-controlled on omeprazole 40 mg daily and Pepcid 20 g a day. I discussed options with her. Weight gain may have led to worsening symptoms, and she will work on that. Otherwise she endorses having an endoscopy 10 years ago without any Barrett's esophagus. Given her symptoms are well controlled at this time, I don't feel strongly we need to pursue endoscopy unless she is having significant breakthrough on this regimen. We discussed long-term risks and benefits of chronic PPI use at length. Recommend lowest-dose needed to control symptoms. Now that she is doing well on  this regimen, recommend she stop the Pepcid as it is probably not providing too much benefit on top of the omeprazole. If she does well with that, over time she can try decreasing result from 40 mg to 20 mg once a day. If she finds that by weaning down his medications her symptoms worsen, she can increase back the dose. Otherwise we'll reach out and get records of her prior upper endoscopy and colonoscopy for her records. She agreed with the plan she can follow-up as needed for this issue.  Herron Cellar, MD [facility]  CC: Jodelle Green, FNP

## 2018-02-26 NOTE — Patient Instructions (Signed)
If you are age 82 or older, your body mass index should be between 23-30. Your Body mass index is 29.32 kg/m. If this is out of the aforementioned range listed, please consider follow up with your Primary Care Provider.  If you are age 59 or younger, your body mass index should be between 19-25. Your Body mass index is 29.32 kg/m. If this is out of the aformentioned range listed, please consider follow up with your Primary Care Provider.   Discontinue Pepcid.  Continue omeprazole 40 mg every day for 1 month.  Then decrease to 20 mg each day.  Follow up as needed.  We will request your records for previous endoscopies from your doctor in [place].   Thank you for entrusting me with your care and for choosing [facility], Dr. Raymond Cellar

## 2018-02-27 ENCOUNTER — Telehealth: Payer: Self-pay | Admitting: Gastroenterology

## 2018-02-27 NOTE — Telephone Encounter (Signed)
Records arrived:   Colonoscopy 11/13/2007 - normal other than internal hemorrhoids, good prep. Done in [facility] [place], Dr. Remo Lipps [place]   No EGD report on file from the office that the patient was seen

## 2018-03-18 DIAGNOSIS — M1712 Unilateral primary osteoarthritis, left knee: Secondary | ICD-10-CM | POA: Diagnosis not present

## 2018-03-18 DIAGNOSIS — G8929 Other chronic pain: Secondary | ICD-10-CM | POA: Diagnosis not present

## 2018-03-18 DIAGNOSIS — M25562 Pain in left knee: Secondary | ICD-10-CM | POA: Diagnosis not present

## 2018-03-20 ENCOUNTER — Ambulatory Visit (INDEPENDENT_AMBULATORY_CARE_PROVIDER_SITE_OTHER): Payer: Medicare Other | Admitting: *Deleted

## 2018-03-20 VITALS — BP 142/68 | HR 63 | Resp 16

## 2018-03-20 DIAGNOSIS — I10 Essential (primary) hypertension: Secondary | ICD-10-CM | POA: Diagnosis not present

## 2018-03-20 NOTE — Progress Notes (Signed)
Patient here for nurse visit BP check per order from  02/25/18  Patient reports compliance with prescribed BP medications: yes  Last dose of BP medication: 0730  BP Readings from Last 3 Encounters:  03/20/18 (!) 148/68  02/26/18 (!) 150/82  02/25/18 (!) 156/84   Pulse Readings from Last 3 Encounters:  03/20/18 63  02/26/18 72  02/25/18 64     Patient verbalized understanding of instructions.   Kerin Salen, LPN

## 2018-03-21 NOTE — Progress Notes (Signed)
BP is remaining stable. Stay on current medications  Follow up in office with NP in 2 months - please call to make appt

## 2018-04-02 ENCOUNTER — Other Ambulatory Visit: Payer: Self-pay | Admitting: Lab

## 2018-04-02 DIAGNOSIS — K219 Gastro-esophageal reflux disease without esophagitis: Secondary | ICD-10-CM

## 2018-04-02 NOTE — Telephone Encounter (Signed)
She is currently on omeprazole and aciphex is the same drug class  Please call to confirm which she is taking

## 2018-04-02 NOTE — Telephone Encounter (Signed)
[facility] New refill request for Rabeorazole sod dr tabs 20mg . Sent to Philis Nettle FNP because its a New prescription.

## 2018-04-05 NOTE — Telephone Encounter (Signed)
Called Pt to find out what medication she was taking. Pt stated that the [facility] doctor took her off the Pepcid and the Omeprazole. He wants  her to take Aciphex

## 2018-04-10 ENCOUNTER — Telehealth: Payer: Self-pay | Admitting: Lab

## 2018-04-10 NOTE — Telephone Encounter (Signed)
Decline. She is on different GERD med.

## 2018-04-10 NOTE — Telephone Encounter (Signed)
[facility] Rx of Rabeprazole SOD DR Tabs 20 mg,  90 days supply

## 2018-04-12 MED ORDER — RABEPRAZOLE SODIUM 20 MG PO TBEC: 20.0000 mg | DELAYED_RELEASE_TABLET | Freq: Every day | ORAL | 3 refills | Status: DC

## 2018-04-12 NOTE — Addendum Note (Signed)
Addended by: Philis Nettle on: 04/12/2018 11:18 AM   Modules accepted: Orders

## 2018-05-14 ENCOUNTER — Ambulatory Visit (INDEPENDENT_AMBULATORY_CARE_PROVIDER_SITE_OTHER): Payer: Medicare Other | Admitting: Family Medicine

## 2018-05-14 ENCOUNTER — Encounter: Payer: Self-pay | Admitting: Family Medicine

## 2018-05-14 VITALS — BP 158/84 | HR 76 | Temp 98.0°F | Resp 20 | Ht 65.0 in | Wt 173.2 lb

## 2018-05-14 DIAGNOSIS — J019 Acute sinusitis, unspecified: Secondary | ICD-10-CM | POA: Diagnosis not present

## 2018-05-14 DIAGNOSIS — R059 Cough, unspecified: Secondary | ICD-10-CM

## 2018-05-14 DIAGNOSIS — R05 Cough: Secondary | ICD-10-CM

## 2018-05-14 MED ORDER — METHYLPREDNISOLONE 4 MG PO TBPK: ORAL_TABLET | ORAL | 0 refills | Status: DC

## 2018-05-14 MED ORDER — DOXYCYCLINE HYCLATE 100 MG PO TABS: 100.0000 mg | ORAL_TABLET | Freq: Two times a day (BID) | ORAL | 0 refills | Status: DC

## 2018-05-14 MED ORDER — BENZONATATE 100 MG PO CAPS: 100.0000 mg | ORAL_CAPSULE | Freq: Three times a day (TID) | ORAL | 1 refills | Status: DC | PRN

## 2018-05-14 NOTE — Progress Notes (Signed)
Subjective:    Patient ID: Christine [name], female    DOB: 1933/03/22, 85 y.o.   MRN: [id number]  HPI   Patient presents to clinic due to sinus congestion, sinus pain or pressure, thick yellow nasal discharge that has been present for almost 3 weeks.  Patient also has a dry hacking cough, seems to be worse at night when trying to lay down.  Patient has taken DayQuil and NyQuil without much effect and fully resolving symptoms.  Reports she did have a little bit of fever at the beginning of the 3 weeks, but no more fevers since.  Denies chills.  Denies body aches.  Denies chest pain, shortness of breath or wheezing.  Denies vomiting or diarrhea.  Patient Active Problem List   Diagnosis Date Noted  . Chronic pain of left knee 12/10/2017  . Essential hypertension 12/10/2017  . Acquired hypothyroidism 12/10/2017  . GERD without esophagitis 12/10/2017  . Hyperlipidemia 12/10/2017   Social History   Tobacco Use  . Smoking status: Never Smoker  . Smokeless tobacco: Never Used  Substance Use Topics  . Alcohol use: Yes    Comment: seldom   Review of Systems   Constitutional: Negative for chills, fatigue and fever.  HENT: +congestion, ear pain, sinus pain. Eyes: Negative.   Respiratory: +dry cough. Negative for shortness of breath and wheezing.   Cardiovascular: Negative for chest pain, palpitations and leg swelling.  Gastrointestinal: Negative for abdominal pain, diarrhea, nausea and vomiting.  Genitourinary: Negative for dysuria, frequency and urgency.  Musculoskeletal: Negative for arthralgias and myalgias.  Skin: Negative for color change, pallor and rash.  Neurological: Negative for syncope, light-headedness and headaches.  Psychiatric/Behavioral: The patient is not nervous/anxious.       Objective:   Physical Exam Vitals signs and nursing note reviewed.  Constitutional:      General: She is not in acute distress.    Appearance: She is not toxic-appearing or diaphoretic.    HENT:     Head: Normocephalic and atraumatic.     Right Ear: There is no impacted cerumen.     Left Ear: There is no impacted cerumen.     Ears:     Comments: +fullness bilat TMs    Nose: Nasal tenderness, congestion and rhinorrhea present.     Right Sinus: Maxillary sinus tenderness and frontal sinus tenderness present.     Left Sinus: Maxillary sinus tenderness and frontal sinus tenderness present.     Comments: +post nasal drip    Mouth/Throat:     Mouth: Mucous membranes are moist.     Pharynx: No oropharyngeal exudate or posterior oropharyngeal erythema.  Eyes:     General: No scleral icterus.    Extraocular Movements: Extraocular movements intact.     Conjunctiva/sclera: Conjunctivae normal.     Pupils: Pupils are equal, round, and reactive to light.  Neck:     Musculoskeletal: Neck supple. No neck rigidity.  Cardiovascular:     Rate and Rhythm: Normal rate and regular rhythm.  Pulmonary:     Effort: Pulmonary effort is normal. No respiratory distress.     Breath sounds: No wheezing, rhonchi or rales.     Comments: +dry cough Lymphadenopathy:     Cervical: No cervical adenopathy.  Skin:    General: Skin is warm and dry.     Coloration: Skin is not pale.  Neurological:     Mental Status: She is alert and oriented to person, place, and time.  Psychiatric:  Mood and Affect: Mood normal.        Behavior: Behavior normal.    Vitals:   05/14/18 1058  BP: (!) 158/84  Pulse: 76  Resp: 20  Temp: 98 F (36.7 C)  SpO2: 97%      Assessment & Plan:    Acute sinusitis/cough-patient's symptoms and physical exam are suspicious for sinus infection.  She will take doxycycline twice daily for 10 days, do saline nasal rinses, get plenty of rest and do good handwashing.  She will also take Medrol Dosepak to reduce inflammation in sinuses.  She will use Tessalon Perles as needed to reduce cough symptoms, suspect cough is related to postnasal drainage due to lungs being  clear on exam.  Patient will follow-up here in 3 months for recheck on chronic medical conditions.  She is aware she can return to clinic sooner if any issues arise.

## 2018-06-03 ENCOUNTER — Ambulatory Visit: Payer: Medicare Other | Admitting: Family Medicine

## 2018-06-03 ENCOUNTER — Telehealth (INDEPENDENT_AMBULATORY_CARE_PROVIDER_SITE_OTHER): Payer: Medicare Other | Admitting: Family Medicine

## 2018-06-03 ENCOUNTER — Other Ambulatory Visit: Payer: Self-pay

## 2018-06-03 DIAGNOSIS — R05 Cough: Secondary | ICD-10-CM

## 2018-06-03 DIAGNOSIS — R059 Cough, unspecified: Secondary | ICD-10-CM

## 2018-06-03 DIAGNOSIS — K219 Gastro-esophageal reflux disease without esophagitis: Secondary | ICD-10-CM

## 2018-06-03 MED ORDER — HYDROCOD POLST-CPM POLST ER 10-8 MG/5ML PO SUER: 5.0000 mL | Freq: Two times a day (BID) | ORAL | 0 refills | Status: DC | PRN

## 2018-06-03 NOTE — Telephone Encounter (Signed)
Yes, documentation is good, however, since this pt. Has Medicare bill [phone]-  I will be there tomorrow at 12 Thanks,  Dawn

## 2018-06-03 NOTE — Telephone Encounter (Signed)
Virtual Visit via Telephone Note  I connected with Christine [name] on 06/03/2018 at 1:45 PM telephone and verified that I am speaking with the correct person using two identifiers.   I discussed the limitations, risks, security and privacy concerns of performing an evaluation and management service by telephone and the availability of in person appointments. I also discussed with the patient that there may be a patient responsible charge related to this service. The patient expressed understanding and agreed to proceed.   History of Present Illness:   Patient originally requested appointment to be seen in clinic today due to a dry hacking cough that will not go away.  Patient has been trying over-the-counter Tessalon Perles, rest and increase fluids without much help in getting cough to fully resolve.  Patient was seen on 05/14/2018 acute sinusitis and was treated with doxycycline twice daily for 10 days, Medrol Dosepak and also advised to take Claritin and do saline nasal rinses.  Patient did finish her doxycycline course and Dosepak, but states she has not been taking her Claritin or doing any saline nasal rinses.  Patient does have a documented pollen allergy in epic.    Observations/Objective: Patient appears to be in no acute distress while talking on phone.  Is able to talk in full sentences and is not sounding breathless when speaking.  Patient is able to clearly explain her symptoms and answer all my questions.  She denies any fevers or feeling feverish.  She denies any travel outside of the [place] area.   Assessment and Plan: I suspect patient could either have a dry cough related to seasonal pollen allergies, and/or exacerbation of GERD.  Advised to begin taking Claritin as she had been instructed previously, do saline nasal rinses and continue to take GERD medication every day.  Patient advised to continue using Tessalon Perles as needed for cough reduction during the  day, and stronger cough syrup Tussionex sent into pharmacy to reduce cough symptoms and allow patient to get some sleep.   Follow Up Instructions:    I discussed the assessment and treatment plan with the patient. The patient was provided an opportunity to ask questions and all were answered. The patient agreed with the plan and demonstrated an understanding of the instructions.   The patient was advised to call back or seek an in-person evaluation if the symptoms worsen or if the condition fails to improve as anticipated.  I provided 8 minutes of non-face-to-face time during this encounter.   Jodelle Green, FNP

## 2018-06-03 NOTE — Progress Notes (Signed)
See telephone encounter 06/03/2018 -- telephone call visit done by [facility] FNP  Please don't double bill  Telephone call done one time  [facility] FNP

## 2018-06-11 ENCOUNTER — Other Ambulatory Visit: Payer: Self-pay | Admitting: Family Medicine

## 2018-06-11 DIAGNOSIS — R0982 Postnasal drip: Secondary | ICD-10-CM

## 2018-08-14 ENCOUNTER — Ambulatory Visit: Payer: Medicare Other | Admitting: Family Medicine

## 2018-10-02 ENCOUNTER — Other Ambulatory Visit: Payer: Self-pay

## 2018-10-03 ENCOUNTER — Ambulatory Visit (INDEPENDENT_AMBULATORY_CARE_PROVIDER_SITE_OTHER): Payer: Medicare Other

## 2018-10-03 ENCOUNTER — Encounter: Payer: Self-pay | Admitting: Family Medicine

## 2018-10-03 ENCOUNTER — Other Ambulatory Visit: Payer: Self-pay

## 2018-10-03 ENCOUNTER — Ambulatory Visit (INDEPENDENT_AMBULATORY_CARE_PROVIDER_SITE_OTHER): Payer: Medicare Other | Admitting: Family Medicine

## 2018-10-03 VITALS — BP 148/82 | HR 64 | Temp 98.2°F | Resp 18 | Ht 65.0 in | Wt 177.8 lb

## 2018-10-03 DIAGNOSIS — K219 Gastro-esophageal reflux disease without esophagitis: Secondary | ICD-10-CM

## 2018-10-03 DIAGNOSIS — E039 Hypothyroidism, unspecified: Secondary | ICD-10-CM | POA: Diagnosis not present

## 2018-10-03 DIAGNOSIS — G8929 Other chronic pain: Secondary | ICD-10-CM | POA: Diagnosis not present

## 2018-10-03 DIAGNOSIS — I10 Essential (primary) hypertension: Secondary | ICD-10-CM

## 2018-10-03 DIAGNOSIS — M25561 Pain in right knee: Secondary | ICD-10-CM

## 2018-10-03 DIAGNOSIS — M549 Dorsalgia, unspecified: Secondary | ICD-10-CM | POA: Diagnosis not present

## 2018-10-03 DIAGNOSIS — E785 Hyperlipidemia, unspecified: Secondary | ICD-10-CM

## 2018-10-03 LAB — COMPREHENSIVE METABOLIC PANEL
ALT: 23 U/L (ref 0–35)
AST: 29 U/L (ref 0–37)
Albumin: 4.4 g/dL (ref 3.5–5.2)
Alkaline Phosphatase: 60 U/L (ref 39–117)
BUN: 18 mg/dL (ref 6–23)
CO2: 27 mEq/L (ref 19–32)
Calcium: 10.3 mg/dL (ref 8.4–10.5)
Chloride: 103 mEq/L (ref 96–112)
Creatinine, Ser: 0.9 mg/dL (ref 0.40–1.20)
GFR: 59.74 mL/min — ABNORMAL LOW (ref >60.00)
Glucose, Bld: 95 mg/dL (ref 70–99)
Potassium: 4.3 mEq/L (ref 3.5–5.1)
Sodium: 138 mEq/L (ref 135–145)
Total Bilirubin: 0.7 mg/dL (ref 0.2–1.2)
Total Protein: 6.7 g/dL (ref 6.0–8.3)

## 2018-10-03 LAB — POCT URINALYSIS DIPSTICK
Bilirubin, UA: NEGATIVE
Blood, UA: NEGATIVE
Glucose, UA: NEGATIVE
Ketones, UA: NEGATIVE
Leukocytes, UA: NEGATIVE
Nitrite, UA: NEGATIVE
Protein, UA: NEGATIVE
Spec Grav, UA: <=1.005 — AB (ref 1.010–1.025)
Urobilinogen, UA: 0.2 E.U./dL
pH, UA: 6 (ref 5.0–8.0)

## 2018-10-03 LAB — CBC WITH DIFFERENTIAL/PLATELET
Basophils Absolute: 0 10*3/uL (ref 0.0–0.1)
Basophils Relative: 1 % (ref 0.0–3.0)
Eosinophils Absolute: 0.3 10*3/uL (ref 0.0–0.7)
Eosinophils Relative: 5.3 % — ABNORMAL HIGH (ref 0.0–5.0)
HCT: 45.2 % (ref 36.0–46.0)
Hemoglobin: 15.1 g/dL — ABNORMAL HIGH (ref 12.0–15.0)
Lymphocytes Relative: 33.9 % (ref 12.0–46.0)
Lymphs Abs: 1.7 10*3/uL (ref 0.7–4.0)
MCHC: 33.4 g/dL (ref 30.0–36.0)
MCV: 89.9 fl (ref 78.0–100.0)
Monocytes Absolute: 0.5 10*3/uL (ref 0.1–1.0)
Monocytes Relative: 9.1 % (ref 3.0–12.0)
Neutro Abs: 2.5 10*3/uL (ref 1.4–7.7)
Neutrophils Relative %: 50.7 % (ref 43.0–77.0)
Platelets: 232 10*3/uL (ref 150.0–400.0)
RBC: 5.03 Mil/uL (ref 3.87–5.11)
RDW: 13.2 % (ref 11.5–15.5)
WBC: 5 10*3/uL (ref 4.0–10.5)

## 2018-10-03 LAB — TSH: TSH: 1.53 u[IU]/mL (ref 0.35–4.50)

## 2018-10-03 LAB — LIPID PANEL
Cholesterol: 181 mg/dL (ref 0–200)
HDL: 52.7 mg/dL (ref >39.00)
LDL Cholesterol: 91 mg/dL (ref 0–99)
NonHDL: 128.26
Total CHOL/HDL Ratio: 3
Triglycerides: 186 mg/dL — ABNORMAL HIGH (ref 0.0–149.0)
VLDL: 37.2 mg/dL (ref 0.0–40.0)

## 2018-10-03 LAB — MAGNESIUM: Magnesium: 2 mg/dL (ref 1.5–2.5)

## 2018-10-03 NOTE — Progress Notes (Signed)
Subjective:    Patient ID: Christine [name], female    DOB: Apr 03, 1933, 85 y.o.   MRN: [id number]  HPI   Patient presents to clinic due to right knee pain and also low back ache & chronic illness follow up.  Knee pain is chronic -- right or left knee seems to flare on occasion. Right knee has been aching for past week. No injury or fall.   Low back has been aching for past few days. States her family is concerned she has a kidney infection. No dysuria, increased urinary urgency or frequency.   Bps have been in 140s-150s/80s. Tolerating BP meds with out side effects. No CP or LE swelling.  Energy level feels normal. Taking levothyroxine daily.  GERD seems under good control. Has days where she feels bloated, burping more but overall feels well.   Tolerating fenobibrate for lipids without issues.   Patient Active Problem List   Diagnosis Date Noted  . Chronic pain of left knee 12/10/2017  . Essential hypertension 12/10/2017  . Acquired hypothyroidism 12/10/2017  . GERD without esophagitis 12/10/2017  . Hyperlipidemia 12/10/2017   Social History   Tobacco Use  . Smoking status: Never Smoker  . Smokeless tobacco: Never Used  Substance Use Topics  . Alcohol use: Yes    Comment: seldom    Review of Systems  Constitutional: Negative for chills, fatigue and fever.  HENT: Negative for congestion, ear pain, sinus pain and sore throat.   Eyes: Negative.   Respiratory: Negative for cough, shortness of breath and wheezing.   Cardiovascular: Negative for chest pain, palpitations and leg swelling.  Gastrointestinal: Negative for abdominal pain, diarrhea, nausea and vomiting.  Genitourinary: Negative for dysuria, frequency and urgency.  Musculoskeletal:+right knee pain, low back aches Skin: Negative for color change, pallor and rash.  Neurological: Negative for syncope, light-headedness and headaches.  Psychiatric/Behavioral: The patient is not nervous/anxious.        Objective:   Physical Exam Vitals signs and nursing note reviewed.  Constitutional:      General: She is not in acute distress.    Appearance: She is not ill-appearing, toxic-appearing or diaphoretic.  HENT:     Head: Normocephalic and atraumatic.  Eyes:     General: No scleral icterus.    Extraocular Movements: Extraocular movements intact.     Pupils: Pupils are equal, round, and reactive to light.  Neck:     Musculoskeletal: Normal range of motion and neck supple.     Vascular: No carotid bruit.  Cardiovascular:     Rate and Rhythm: Normal rate and regular rhythm.     Heart sounds: Normal heart sounds.  Pulmonary:     Effort: Pulmonary effort is normal. No respiratory distress.     Breath sounds: Normal breath sounds.  Abdominal:     General: Bowel sounds are normal. There is no distension.     Palpations: Abdomen is soft. There is no mass.     Tenderness: There is no abdominal tenderness. There is no right CVA tenderness, left CVA tenderness, guarding or rebound.  Musculoskeletal:     Right lower leg: No edema.     Left lower leg: No edema.     Comments: +pain right and left knee.  Knee pain is chronic. Right knee seems to be flaring up now. Pain at extreme limits of range. No crepitus. No obvious deformity or swelling.   Skin:    General: Skin is warm and dry.     Coloration: Skin  is not jaundiced or pale.  Neurological:     General: No focal deficit present.     Mental Status: She is alert and oriented to person, place, and time.     Gait: Gait (no limping with walking) normal.  Psychiatric:        Mood and Affect: Mood normal.        Behavior: Behavior normal.        Thought Content: Thought content normal.        Judgment: Judgment normal.      Vitals:   10/03/18 1145  BP: (!) 148/82  Pulse: 64  Resp: 18  Temp: 98.2 F (36.8 C)  SpO2: 97%       Assessment & Plan:    1. Low back pain, chronic UA is clear. Will send for culture. Suspect pain in  back musculoskeletal in nature. Discussed ROM exercises, tylenol PRN, topical rub like bengay or biofreeze  - Urine Culture - POCT urinalysis dipstick  2. GERD without esophagitis Continue PPI. Check labs  - CBC w/Diff - Comp Met (CMET) - Magnesium  3. Essential hypertension Continue Cozaar & amlodipine. Check labs.  - CBC w/Diff - Comp Met (CMET) - TSH  4. Acquired hypothyroidism Continue levothyroxine. Check labs.  - TSH - Lipid panel  5. Hyperlipidemia, unspecified hyperlipidemia type Continue fenofibrate, check labs  6. Chronic pain of right knee Suspect knee pain is osteoarthritis. Will get knee xray. Discussed ROM exercises, tylenol PRN, topical rub like bengay or Biofreeze. May need to see orthopedics if arthritis severe.  - DG Knee Complete 4 Views Right; Future   Follow up in 3 months for recheck. Aware she can return to clinic sooner if issues arise.

## 2018-10-04 LAB — URINE CULTURE
MICRO NUMBER:: 697126
Result:: NO GROWTH
SPECIMEN QUALITY:: ADEQUATE

## 2018-11-19 ENCOUNTER — Telehealth: Payer: Self-pay

## 2018-11-19 ENCOUNTER — Ambulatory Visit: Payer: Medicare Other

## 2018-11-19 NOTE — Telephone Encounter (Signed)
Called patient at scheduled time for annual wellness telephone appointment. No answer. Attempted to leave a message on voice mail, unsure if message was accepted, there was no beep to end. Please reschedule as appropriate.

## 2018-12-06 ENCOUNTER — Other Ambulatory Visit: Payer: Self-pay | Admitting: Family Medicine

## 2018-12-06 DIAGNOSIS — E785 Hyperlipidemia, unspecified: Secondary | ICD-10-CM

## 2018-12-12 ENCOUNTER — Encounter: Payer: Self-pay | Admitting: Family Medicine

## 2018-12-12 ENCOUNTER — Ambulatory Visit (INDEPENDENT_AMBULATORY_CARE_PROVIDER_SITE_OTHER): Payer: Medicare Other | Admitting: Family Medicine

## 2018-12-12 ENCOUNTER — Other Ambulatory Visit: Payer: Self-pay

## 2018-12-12 VITALS — BP 145/70 | HR 69 | Temp 97.4°F | Ht 65.5 in | Wt 174.8 lb

## 2018-12-12 DIAGNOSIS — G2581 Restless legs syndrome: Secondary | ICD-10-CM

## 2018-12-12 DIAGNOSIS — Z23 Encounter for immunization: Secondary | ICD-10-CM

## 2018-12-12 LAB — CBC
HCT: 47.6 % — ABNORMAL HIGH (ref 36.0–46.0)
Hemoglobin: 15.9 g/dL — ABNORMAL HIGH (ref 12.0–15.0)
MCHC: 33.3 g/dL (ref 30.0–36.0)
MCV: 91 fl (ref 78.0–100.0)
Platelets: 220 10*3/uL (ref 150.0–400.0)
RBC: 5.23 Mil/uL — ABNORMAL HIGH (ref 3.87–5.11)
RDW: 13.8 % (ref 11.5–15.5)
WBC: 6.5 10*3/uL (ref 4.0–10.5)

## 2018-12-12 LAB — BASIC METABOLIC PANEL
BUN: 16 mg/dL (ref 6–23)
CO2: 26 mEq/L (ref 19–32)
Calcium: 10.3 mg/dL (ref 8.4–10.5)
Chloride: 102 mEq/L (ref 96–112)
Creatinine, Ser: 0.93 mg/dL (ref 0.40–1.20)
GFR: 57.5 mL/min — ABNORMAL LOW (ref >60.00)
Glucose, Bld: 102 mg/dL — ABNORMAL HIGH (ref 70–99)
Potassium: 4.8 mEq/L (ref 3.5–5.1)
Sodium: 138 mEq/L (ref 135–145)

## 2018-12-12 LAB — PHOSPHORUS: Phosphorus: 3 mg/dL (ref 2.3–4.6)

## 2018-12-12 LAB — MAGNESIUM: Magnesium: 2 mg/dL (ref 1.5–2.5)

## 2018-12-12 MED ORDER — CLONAZEPAM 0.5 MG PO TABS: 0.2500 mg | ORAL_TABLET | Freq: Every day | ORAL | 1 refills | Status: DC

## 2018-12-12 NOTE — Patient Instructions (Signed)

## 2018-12-12 NOTE — Progress Notes (Signed)
Subjective:    Patient ID: Christine [name], female    DOB: [birth year], 85 y.o.   MRN: [id number]  HPI  Patient presents to clinic due to some jerking in legs at night and some cramping in her legs.  Patient states it happens on occasion, some nights worse than others.  States a family member gave her clonazepam and this worked well to calm down the leg jerking and she was able to get good rest.  States the jerking and loss of sense of legs does not happen every night, so would prefer something that she can use only as needed rather than take every day.  Does have chronic knee pain related to osteoarthritis, this is unchanged.  Patient Active Problem List   Diagnosis Date Noted  . Chronic pain of left knee 12/10/2017  . Essential hypertension 12/10/2017  . Acquired hypothyroidism 12/10/2017  . GERD without esophagitis 12/10/2017  . Hyperlipidemia 12/10/2017   Social History   Tobacco Use  . Smoking status: Never Smoker  . Smokeless tobacco: Never Used  Substance Use Topics  . Alcohol use: Yes    Comment: seldom   Review of Systems  Constitutional: Negative for chills, fatigue and fever.  HENT: Negative for congestion, ear pain, sinus pain and sore throat.   Eyes: Negative.   Respiratory: Negative for cough, shortness of breath and wheezing.   Cardiovascular: Negative for chest pain, palpitations and leg swelling.  Gastrointestinal: Negative for abdominal pain, diarrhea, nausea and vomiting.  Genitourinary: Negative for dysuria, frequency and urgency.  Musculoskeletal: +restless legs Skin: Negative for color change, pallor and rash.  Neurological: Negative for syncope, light-headedness and headaches.  Psychiatric/Behavioral: The patient is not nervous/anxious.       Objective:   Physical Exam Vitals signs and nursing note reviewed.  Constitutional:      General: She is not in acute distress.    Appearance: She is not ill-appearing, toxic-appearing or diaphoretic.   HENT:     Head: Normocephalic and atraumatic.  Eyes:     General: No scleral icterus.    Extraocular Movements: Extraocular movements intact.     Conjunctiva/sclera: Conjunctivae normal.     Pupils: Pupils are equal, round, and reactive to light.  Neck:     Musculoskeletal: Neck supple. No neck rigidity.  Cardiovascular:     Rate and Rhythm: Normal rate and regular rhythm.     Heart sounds: Normal heart sounds.  Pulmonary:     Effort: Pulmonary effort is normal. No respiratory distress.     Breath sounds: Normal breath sounds.  Musculoskeletal: Normal range of motion.     Right lower leg: No edema.     Left lower leg: No edema.     Comments: Quadricep strength equal and strong.  Dorsi plantarflexion equal and strong.  Light touch sensation intact in lower extremities and feet.  Skin:    General: Skin is warm and dry.  Neurological:     General: No focal deficit present.     Mental Status: She is alert and oriented to person, place, and time.     Gait: Gait normal.  Psychiatric:        Mood and Affect: Mood normal.        Behavior: Behavior normal.    Today's Vitals   12/12/18 1120  BP: (!) 145/70  Pulse: 69  Temp: (!) 97.4 F (36.3 C)  TempSrc: Temporal  SpO2: 98%  Weight: 174 lb 12.8 oz (79.3 kg)  Height: 5' 5.5" (  1.664 m)   Body mass index is 28.65 kg/m.    Assessment & Plan:   Restless leg - patient symptoms do so.  Also foot syndrome.  We will get blood work to rule out any anemia or electrolyte abnormalities.  Discussed different medication options, does not want to take something every day, will try very low-dose clonazepam as needed.  Patient aware to only take this at bedtime if this medication does not cause drowsiness.  Patient no longer drives, family members drive her where she is to go and she does but the family so she has good support.  Discussed eating healthy and balanced diet and keeping up good water intake as well.  Patient keep all regular  follow-ups as planned and return to clinic sooner if any issues arise.  Flu vaccine given today.

## 2018-12-21 ENCOUNTER — Other Ambulatory Visit: Payer: Self-pay | Admitting: Family Medicine

## 2019-01-27 ENCOUNTER — Telehealth: Payer: Self-pay | Admitting: Family Medicine

## 2019-01-27 DIAGNOSIS — K219 Gastro-esophageal reflux disease without esophagitis: Secondary | ICD-10-CM

## 2019-01-27 DIAGNOSIS — E039 Hypothyroidism, unspecified: Secondary | ICD-10-CM

## 2019-01-27 MED ORDER — RABEPRAZOLE SODIUM 20 MG PO TBEC: 20.0000 mg | DELAYED_RELEASE_TABLET | Freq: Every day | ORAL | 3 refills | Status: DC

## 2019-01-27 MED ORDER — LEVOTHYROXINE SODIUM 75 MCG PO TABS: 75.0000 ug | ORAL_TABLET | Freq: Every day | ORAL | 3 refills | Status: DC

## 2019-01-27 NOTE — Addendum Note (Signed)
Addended by: Philis Nettle on: 01/27/2019 04:17 PM   Modules accepted: Orders

## 2019-01-27 NOTE — Telephone Encounter (Signed)
Medication Refill - Medication: RABEprazole (ACIPHEX) 20 MG tablet  And  levothyroxine (SYNTHROID, LEVOTHROID) 75 MCG tablet    Has the patient contacted their pharmacy? Yes.   (Agent: If no, request that the patient contact the pharmacy for the refill.) (Agent: If yes, when and what did the pharmacy advise?)  Preferred Pharmacy (with phone number or street name):  [facility] [address] [phone] (Phone) [phone] (Fax    Agent: Please be advised that RX refills may take up to 3 business days. We ask that you follow-up with your pharmacy.

## 2019-02-28 ENCOUNTER — Other Ambulatory Visit: Payer: Self-pay | Admitting: *Deleted

## 2019-02-28 DIAGNOSIS — G2581 Restless legs syndrome: Secondary | ICD-10-CM

## 2019-02-28 NOTE — Telephone Encounter (Signed)
Okay to refill? Former [facility] patient

## 2019-03-02 NOTE — Telephone Encounter (Signed)
Please contact the patient and see if she has gotten benefit from this medication for her restless legs. This medication is not ideal for treatment of restless legs given potential for side effects and dependence. It would be preferable for her to complete a visit to discuss alternative treatment options. Thanks.

## 2019-04-09 NOTE — Telephone Encounter (Signed)
I called to speak with the patient about her medication and to see if she is still a patient here and if the medication she is on is helping her restless leg syndrome.  Danyale Ridinger,cma

## 2019-04-10 ENCOUNTER — Other Ambulatory Visit: Payer: Self-pay | Admitting: Internal Medicine

## 2019-04-10 DIAGNOSIS — G2581 Restless legs syndrome: Secondary | ICD-10-CM

## 2019-04-10 MED ORDER — CLONAZEPAM 0.5 MG PO TABS: 0.2500 mg | ORAL_TABLET | Freq: Every day | ORAL | 0 refills | Status: DC

## 2019-04-10 NOTE — Telephone Encounter (Signed)
I called and left a voicemail informing the patient that the refill for clonazepam was sent to the pharmacy it was enough until her appointment at [facility].  Laray Rivkin,cma

## 2019-04-10 NOTE — Telephone Encounter (Signed)
Sent enough refills until 04/23/19 appt

## 2019-04-10 NOTE — Telephone Encounter (Signed)
This patient has been calling and she needs a refill on Clonazepam, she takes this medication for restless leg syndrome Dr. Caryl Bis informed to ask if this medication was helping her and she called back today and stated it helps a lot and she has been out since November, she states she can't sleep at night and the  medication helps her a lot, she just moved back here from [facility] with her daughter and she lives a block away from [place], so since Philis Nettle has left she is scheduled as a new patient at [facility] her appt is on February 10, can you fill and send to her pharmacy until her appt date.  Thanks, Jasani Lengel,cma

## 2019-04-23 ENCOUNTER — Ambulatory Visit: Payer: Medicare Other | Admitting: Family Medicine

## 2019-06-03 ENCOUNTER — Ambulatory Visit (INDEPENDENT_AMBULATORY_CARE_PROVIDER_SITE_OTHER): Payer: Medicare Other | Admitting: Family Medicine

## 2019-06-03 ENCOUNTER — Other Ambulatory Visit: Payer: Self-pay

## 2019-06-03 ENCOUNTER — Encounter: Payer: Self-pay | Admitting: Family Medicine

## 2019-06-03 VITALS — BP 142/78 | HR 64 | Temp 97.9°F | Ht 65.75 in | Wt 182.2 lb

## 2019-06-03 DIAGNOSIS — I10 Essential (primary) hypertension: Secondary | ICD-10-CM | POA: Diagnosis not present

## 2019-06-03 DIAGNOSIS — E039 Hypothyroidism, unspecified: Secondary | ICD-10-CM | POA: Diagnosis not present

## 2019-06-03 DIAGNOSIS — M170 Bilateral primary osteoarthritis of knee: Secondary | ICD-10-CM | POA: Insufficient documentation

## 2019-06-03 NOTE — Progress Notes (Signed)
Subjective:     Christine [name] is a 86 y.o. female presenting for Transfer of Care (from Philis Nettle, NP) and Knee Pain (left, at least 6 months. has seen ortho before but for the right knee.)     HPI   #Knee pain - left side - symptoms x 1-2 years - prior right knee pain - saw ortho - left knee pain - XR in 2019 w/ arthritic changes - doing the exercises that she was given w/o improvement - limits walking  - has a gym   #Carotid artery abnormalities - not sure what she is diagnosed with - is due for repeat imaging - notified by previous   #abnormal BP and HA - is getting some occasional lightheadedness - 2 weeks ago got nausea, HA and then felt dizzy > checked BP and was 200/104 - she took 2 losartan and went to lay down and her BP 180/94 - stayed in bed the next day - highest since then 160/98 - continues to have HA but also has allergies symptoms  Review of Systems   Social History   Tobacco Use  Smoking Status Never Smoker  Smokeless Tobacco Never Used        Objective:    BP Readings from Last 3 Encounters:  06/03/19 (!) 142/78  12/12/18 (!) 145/70  10/03/18 (!) 148/82   Wt Readings from Last 3 Encounters:  06/03/19 182 lb 4 oz (82.7 kg)  12/12/18 174 lb 12.8 oz (79.3 kg)  10/03/18 177 lb 12.8 oz (80.6 kg)    BP (!) 142/78   Pulse 64   Temp 97.9 F (36.6 C)   Ht 5' 5.75" (1.67 m)   Wt 182 lb 4 oz (82.7 kg)   SpO2 96%   BMI 29.64 kg/m    Physical Exam Constitutional:      General: She is not in acute distress.    Appearance: She is well-developed. She is not diaphoretic.  HENT:     Right Ear: External ear normal.     Left Ear: External ear normal.     Nose: Nose normal.  Eyes:     Conjunctiva/sclera: Conjunctivae normal.  Cardiovascular:     Rate and Rhythm: Normal rate and regular rhythm.     Heart sounds: No murmur.  Pulmonary:     Effort: Pulmonary effort is normal. No respiratory distress.     Breath sounds: Normal breath  sounds. No wheezing.  Musculoskeletal:     Cervical back: Neck supple.     Comments: Left knee:  Inspection: no swelling or erythema Palpation: Medial joint line TTP ROM: normal w/ crepitus Strength: normal Ligaments: normal  Skin:    General: Skin is warm and dry.     Capillary Refill: Capillary refill takes less than 2 seconds.  Neurological:     Mental Status: She is alert. Mental status is at baseline.  Psychiatric:        Mood and Affect: Mood normal.        Behavior: Behavior normal.     Reviewed XR 11/2017 Left knee - mild to moderate tricompartmental degenerative changes      Assessment & Plan:   Problem List Items Addressed This Visit      Cardiovascular and Mediastinum   Essential hypertension - Primary    BP mildly elevated today and she notes an episode of significant elevation at home. Cont losartan. Will continue to monitor and if remaining elevated may consider dose increase or additional agent  Endocrine   Acquired hypothyroidism    Last TSH in July and normal. Cont levothyroxine.         Musculoskeletal and Integument   Osteoarthritis of knees, bilateral    Pt with arthritis bilaterally. Discussed options of PT, injection, or surgery if significant. Not interested in surgery or injection at this time. Encouraged regular exercise including stationary bike. She will do trial of Tumeric and if no improvement will reach out for physical therapy referral. She will continue her home exercises        She also mentioned a need for repeat vascular imaging. She was not sure what the diagnosis was - though suspect carotid plaques though no chart documentation.   Will obtain vascular records and get imaging as appropriate and referral if needed  Return in about 3 months (around 09/03/2019).  [name], MD

## 2019-06-03 NOTE — Assessment & Plan Note (Signed)
BP mildly elevated today and she notes an episode of significant elevation at home. Cont losartan. Will continue to monitor and if remaining elevated may consider dose increase or additional agent

## 2019-06-03 NOTE — Assessment & Plan Note (Signed)
Pt with arthritis bilaterally. Discussed options of PT, injection, or surgery if significant. Not interested in surgery or injection at this time. Encouraged regular exercise including stationary bike. She will do trial of Tumeric and if no improvement will reach out for physical therapy referral. She will continue her home exercises

## 2019-06-03 NOTE — Assessment & Plan Note (Signed)
Last TSH in July and normal. Cont levothyroxine.

## 2019-06-03 NOTE — Patient Instructions (Addendum)
Curcumin or Tumeric  Research has shown that Curcumin (Tumeric) supplements are just as good as high dose anti-inflammatory medications (like diclofenac and ibuprofen) at treating pain from osteoarthritis without the side effects.   Take Curcumin 500 mg Three times daily   -- You can find a bottle at [employer] for $8 which should last a month -- Lemay is around $9 and is available at [facility]  Return for wellness visit  We will get records for the vascular history to determine the right test  Call back if you want to see physical therapy

## 2019-06-12 ENCOUNTER — Other Ambulatory Visit: Payer: Self-pay | Admitting: Family Medicine

## 2019-06-12 DIAGNOSIS — I6521 Occlusion and stenosis of right carotid artery: Secondary | ICD-10-CM

## 2019-06-12 DIAGNOSIS — I6529 Occlusion and stenosis of unspecified carotid artery: Secondary | ICD-10-CM | POA: Insufficient documentation

## 2019-06-12 NOTE — Progress Notes (Signed)
Received outside records of stenosis and need for annual [place]. Ordering [place] as patient is overdue

## 2019-06-23 ENCOUNTER — Other Ambulatory Visit: Payer: Self-pay

## 2019-06-23 ENCOUNTER — Ambulatory Visit
Admission: RE | Admit: 2019-06-23 | Discharge: 2019-06-23 | Disposition: A | Discharge status: Home / Self Care | Payer: Medicare Other | Source: Ambulatory Visit | Attending: Family Medicine | Admitting: Family Medicine

## 2019-06-23 DIAGNOSIS — I6521 Occlusion and stenosis of right carotid artery: Secondary | ICD-10-CM | POA: Diagnosis not present

## 2019-06-23 DIAGNOSIS — I6523 Occlusion and stenosis of bilateral carotid arteries: Secondary | ICD-10-CM | POA: Diagnosis not present

## 2019-07-30 ENCOUNTER — Other Ambulatory Visit: Payer: Self-pay

## 2019-07-30 DIAGNOSIS — K219 Gastro-esophageal reflux disease without esophagitis: Secondary | ICD-10-CM

## 2019-07-30 DIAGNOSIS — E785 Hyperlipidemia, unspecified: Secondary | ICD-10-CM

## 2019-07-30 DIAGNOSIS — E039 Hypothyroidism, unspecified: Secondary | ICD-10-CM

## 2019-07-30 MED ORDER — FENOFIBRATE 160 MG PO TABS: 160.0000 mg | ORAL_TABLET | Freq: Every day | ORAL | 3 refills | Status: DC

## 2019-07-30 MED ORDER — RABEPRAZOLE SODIUM 20 MG PO TBEC: 20.0000 mg | DELAYED_RELEASE_TABLET | Freq: Every day | ORAL | 3 refills | Status: DC

## 2019-07-30 MED ORDER — LEVOTHYROXINE SODIUM 75 MCG PO TABS: 75.0000 ug | ORAL_TABLET | Freq: Every day | ORAL | 3 refills | Status: DC

## 2019-07-30 MED ORDER — LOSARTAN POTASSIUM 50 MG PO TABS: 50.0000 mg | ORAL_TABLET | Freq: Every day | ORAL | 3 refills | Status: DC

## 2019-07-30 NOTE — Telephone Encounter (Signed)
Refill request from [facility]. Dr Einar Pheasant ok to refill medications for 1 year?  LOV was on 06/03/19 for [facility]  Next appointment on 08/13/2019

## 2019-08-13 ENCOUNTER — Ambulatory Visit (INDEPENDENT_AMBULATORY_CARE_PROVIDER_SITE_OTHER): Payer: Medicare Other | Admitting: Family Medicine

## 2019-08-13 ENCOUNTER — Encounter: Payer: Self-pay | Admitting: Family Medicine

## 2019-08-13 ENCOUNTER — Other Ambulatory Visit: Payer: Self-pay

## 2019-08-13 VITALS — BP 118/62 | HR 72 | Temp 97.5°F | Ht 65.5 in | Wt 182.0 lb

## 2019-08-13 DIAGNOSIS — I10 Essential (primary) hypertension: Secondary | ICD-10-CM

## 2019-08-13 DIAGNOSIS — E785 Hyperlipidemia, unspecified: Secondary | ICD-10-CM | POA: Diagnosis not present

## 2019-08-13 DIAGNOSIS — M170 Bilateral primary osteoarthritis of knee: Secondary | ICD-10-CM | POA: Diagnosis not present

## 2019-08-13 DIAGNOSIS — E039 Hypothyroidism, unspecified: Secondary | ICD-10-CM

## 2019-08-13 LAB — COMPREHENSIVE METABOLIC PANEL
ALT: 29 U/L (ref 0–35)
AST: 33 U/L (ref 0–37)
Albumin: 4.5 g/dL (ref 3.5–5.2)
Alkaline Phosphatase: 66 U/L (ref 39–117)
BUN: 23 mg/dL (ref 6–23)
CO2: 28 mEq/L (ref 19–32)
Calcium: 10.1 mg/dL (ref 8.4–10.5)
Chloride: 101 mEq/L (ref 96–112)
Creatinine, Ser: 1.14 mg/dL (ref 0.40–1.20)
GFR: 45.38 mL/min — ABNORMAL LOW (ref >60.00)
Glucose, Bld: 104 mg/dL — ABNORMAL HIGH (ref 70–99)
Potassium: 4.9 mEq/L (ref 3.5–5.1)
Sodium: 137 mEq/L (ref 135–145)
Total Bilirubin: 0.6 mg/dL (ref 0.2–1.2)
Total Protein: 6.9 g/dL (ref 6.0–8.3)

## 2019-08-13 LAB — LIPID PANEL
Cholesterol: 206 mg/dL — ABNORMAL HIGH (ref 0–200)
HDL: 54.3 mg/dL (ref >39.00)
LDL Cholesterol: 124 mg/dL — ABNORMAL HIGH (ref 0–99)
NonHDL: 151.97
Total CHOL/HDL Ratio: 4
Triglycerides: 140 mg/dL (ref 0.0–149.0)
VLDL: 28 mg/dL (ref 0.0–40.0)

## 2019-08-13 LAB — TSH: TSH: 0.78 u[IU]/mL (ref 0.35–4.50)

## 2019-08-13 NOTE — Assessment & Plan Note (Signed)
Briefly discussed switching to statin, but given that she has been stable on fenofibrate and is 86 will hold off for now. Repeat lipids today

## 2019-08-13 NOTE — Assessment & Plan Note (Signed)
Doing well, will check blood work. Cont levo

## 2019-08-13 NOTE — Assessment & Plan Note (Signed)
BP with good control. Does have intermittent orthostatic symptoms - discussed monitoring and that we could consider decreasing losartan if persisting

## 2019-08-13 NOTE — Assessment & Plan Note (Signed)
Improvement with Tumeric. Cont exercise. Cont tylenol prn and can try voltaren gel otc. Also discussed if worsening would recommend steroid injection.

## 2019-08-13 NOTE — Patient Instructions (Addendum)
#  Arthritis - continue using Tumeric - Can also try Voltaren Gel (topical anti-inflammatory)   #Hypertension - if the dizziness with position gets worse then we should decrease the Losartan - continue checking at home - Goal Blood Pressure -- 120-130/70-80

## 2019-08-13 NOTE — Progress Notes (Signed)
Subjective:     Christine [name] is a 86 y.o. female presenting for Hypertension     HPI   #HTN - thinks her machine needs to be calibrated - typically runs in normal range at home but occasionally high - staying hydrated - uses a 32 oz drinking  - does get dizzy with position changes - endorses some SOB  - walks every day - 20 minute walk and using the fitness center bicycle  #Knee arthritis - started tumeric and thinks this has helped - also using tylenol - tried a different brand   Review of Systems  06/03/2019: Clinic - HTN - cont medication and monitoring. hypothyroid - stable  Social History   Tobacco Use  Smoking Status Never Smoker  Smokeless Tobacco Never Used        Objective:    BP Readings from Last 3 Encounters:  08/13/19 118/62  06/03/19 (!) 142/78  12/12/18 (!) 145/70   Wt Readings from Last 3 Encounters:  08/13/19 182 lb (82.6 kg)  06/03/19 182 lb 4 oz (82.7 kg)  12/12/18 174 lb 12.8 oz (79.3 kg)    BP 118/62   Pulse 72   Temp (!) 97.5 F (36.4 C) (Temporal)   Ht 5' 5.5" (1.664 m)   Wt 182 lb (82.6 kg)   SpO2 95%   BMI 29.83 kg/m    Physical Exam Constitutional:      General: She is not in acute distress.    Appearance: She is well-developed. She is not diaphoretic.  HENT:     Right Ear: External ear normal.     Left Ear: External ear normal.  Eyes:     Conjunctiva/sclera: Conjunctivae normal.  Cardiovascular:     Rate and Rhythm: Normal rate and regular rhythm.     Heart sounds: No murmur.  Pulmonary:     Effort: Pulmonary effort is normal. No respiratory distress.     Breath sounds: Normal breath sounds. No wheezing.  Musculoskeletal:     Cervical back: Neck supple.  Skin:    General: Skin is warm and dry.     Capillary Refill: Capillary refill takes less than 2 seconds.  Neurological:     Mental Status: She is alert. Mental status is at baseline.  Psychiatric:        Mood and Affect: Mood normal.      Behavior: Behavior normal.           Assessment & Plan:   Problem List Items Addressed This Visit      Cardiovascular and Mediastinum   Essential hypertension    BP with good control. Does have intermittent orthostatic symptoms - discussed monitoring and that we could consider decreasing losartan if persisting      Relevant Orders   Comprehensive metabolic panel     Endocrine   Acquired hypothyroidism - Primary    Doing well, will check blood work. Cont levo      Relevant Orders   TSH     Musculoskeletal and Integument   Osteoarthritis of knees, bilateral    Improvement with Tumeric. Cont exercise. Cont tylenol prn and can try voltaren gel otc. Also discussed if worsening would recommend steroid injection.         Other   Hyperlipidemia    Briefly discussed switching to statin, but given that she has been stable on fenofibrate and is 86 will hold off for now. Repeat lipids today      Relevant Orders   Lipid panel  Return in about 6 months (around 02/12/2020).  [name], MD

## 2019-08-14 ENCOUNTER — Telehealth: Payer: Self-pay

## 2019-08-14 NOTE — Telephone Encounter (Signed)
Patient aware of results and recommendations. °

## 2019-08-14 NOTE — Telephone Encounter (Signed)
-----   Message from [name], MD sent at 08/14/2019  2:00 PM EDT ----- Please let patient know that her cholesterol is a little worse than last year - focus on healthy eating. Her thyroid is normal, continue medication.   Her kidney function is also a little worse than last year. I would like to repeat this in 6 months when I see her and if it is still low, I may have her see the kidney specialist. Continue active lifestyle and hydration.

## 2019-09-03 ENCOUNTER — Ambulatory Visit: Payer: Medicare Other | Admitting: Family Medicine

## 2019-11-21 DIAGNOSIS — M1711 Unilateral primary osteoarthritis, right knee: Secondary | ICD-10-CM | POA: Diagnosis not present

## 2019-11-21 DIAGNOSIS — G8929 Other chronic pain: Secondary | ICD-10-CM | POA: Diagnosis not present

## 2019-11-21 DIAGNOSIS — M25562 Pain in left knee: Secondary | ICD-10-CM | POA: Diagnosis not present

## 2019-11-21 DIAGNOSIS — M65332 Trigger finger, left middle finger: Secondary | ICD-10-CM | POA: Diagnosis not present

## 2019-11-21 DIAGNOSIS — M25561 Pain in right knee: Secondary | ICD-10-CM | POA: Diagnosis not present

## 2019-11-21 DIAGNOSIS — M1712 Unilateral primary osteoarthritis, left knee: Secondary | ICD-10-CM | POA: Diagnosis not present

## 2019-12-15 DIAGNOSIS — M1712 Unilateral primary osteoarthritis, left knee: Secondary | ICD-10-CM | POA: Diagnosis not present

## 2019-12-15 DIAGNOSIS — M25561 Pain in right knee: Secondary | ICD-10-CM | POA: Diagnosis not present

## 2019-12-15 DIAGNOSIS — G8929 Other chronic pain: Secondary | ICD-10-CM | POA: Diagnosis not present

## 2019-12-15 DIAGNOSIS — M25562 Pain in left knee: Secondary | ICD-10-CM | POA: Diagnosis not present

## 2019-12-15 DIAGNOSIS — M1711 Unilateral primary osteoarthritis, right knee: Secondary | ICD-10-CM | POA: Diagnosis not present

## 2019-12-22 DIAGNOSIS — M25561 Pain in right knee: Secondary | ICD-10-CM | POA: Diagnosis not present

## 2019-12-22 DIAGNOSIS — M1711 Unilateral primary osteoarthritis, right knee: Secondary | ICD-10-CM | POA: Diagnosis not present

## 2019-12-22 DIAGNOSIS — G8929 Other chronic pain: Secondary | ICD-10-CM | POA: Diagnosis not present

## 2019-12-22 DIAGNOSIS — M1712 Unilateral primary osteoarthritis, left knee: Secondary | ICD-10-CM | POA: Diagnosis not present

## 2019-12-22 DIAGNOSIS — M25562 Pain in left knee: Secondary | ICD-10-CM | POA: Diagnosis not present

## 2019-12-29 DIAGNOSIS — G8929 Other chronic pain: Secondary | ICD-10-CM | POA: Diagnosis not present

## 2019-12-29 DIAGNOSIS — M1711 Unilateral primary osteoarthritis, right knee: Secondary | ICD-10-CM | POA: Diagnosis not present

## 2019-12-29 DIAGNOSIS — M25562 Pain in left knee: Secondary | ICD-10-CM | POA: Diagnosis not present

## 2019-12-29 DIAGNOSIS — M25561 Pain in right knee: Secondary | ICD-10-CM | POA: Diagnosis not present

## 2019-12-29 DIAGNOSIS — M1712 Unilateral primary osteoarthritis, left knee: Secondary | ICD-10-CM | POA: Diagnosis not present

## 2020-02-12 ENCOUNTER — Other Ambulatory Visit: Payer: Self-pay

## 2020-02-12 ENCOUNTER — Encounter: Payer: Self-pay | Admitting: Family Medicine

## 2020-02-12 ENCOUNTER — Ambulatory Visit (INDEPENDENT_AMBULATORY_CARE_PROVIDER_SITE_OTHER)
Admission: RE | Admit: 2020-02-12 | Discharge: 2020-02-12 | Disposition: A | Discharge status: Home / Self Care | Payer: Medicare Other | Source: Ambulatory Visit | Attending: Family Medicine | Admitting: Family Medicine

## 2020-02-12 ENCOUNTER — Ambulatory Visit (INDEPENDENT_AMBULATORY_CARE_PROVIDER_SITE_OTHER): Payer: Medicare Other | Admitting: Family Medicine

## 2020-02-12 VITALS — BP 110/60 | HR 66 | Temp 97.2°F | Ht 66.0 in | Wt 181.8 lb

## 2020-02-12 DIAGNOSIS — R0600 Dyspnea, unspecified: Secondary | ICD-10-CM

## 2020-02-12 DIAGNOSIS — I6521 Occlusion and stenosis of right carotid artery: Secondary | ICD-10-CM

## 2020-02-12 DIAGNOSIS — J4520 Mild intermittent asthma, uncomplicated: Secondary | ICD-10-CM

## 2020-02-12 DIAGNOSIS — I10 Essential (primary) hypertension: Secondary | ICD-10-CM

## 2020-02-12 DIAGNOSIS — R0609 Other forms of dyspnea: Secondary | ICD-10-CM | POA: Insufficient documentation

## 2020-02-12 DIAGNOSIS — E785 Hyperlipidemia, unspecified: Secondary | ICD-10-CM

## 2020-02-12 DIAGNOSIS — I951 Orthostatic hypotension: Secondary | ICD-10-CM | POA: Diagnosis not present

## 2020-02-12 DIAGNOSIS — R0602 Shortness of breath: Secondary | ICD-10-CM | POA: Insufficient documentation

## 2020-02-12 LAB — COMPREHENSIVE METABOLIC PANEL
ALT: 27 U/L (ref 0–35)
AST: 32 U/L (ref 0–37)
Albumin: 4.6 g/dL (ref 3.5–5.2)
Alkaline Phosphatase: 52 U/L (ref 39–117)
BUN: 23 mg/dL (ref 6–23)
CO2: 29 mEq/L (ref 19–32)
Calcium: 10.3 mg/dL (ref 8.4–10.5)
Chloride: 100 mEq/L (ref 96–112)
Creatinine, Ser: 1.23 mg/dL — ABNORMAL HIGH (ref 0.40–1.20)
GFR: 40.29 mL/min — ABNORMAL LOW (ref >60.00)
Glucose, Bld: 106 mg/dL — ABNORMAL HIGH (ref 70–99)
Potassium: 4.8 mEq/L (ref 3.5–5.1)
Sodium: 138 mEq/L (ref 135–145)
Total Bilirubin: 0.7 mg/dL (ref 0.2–1.2)
Total Protein: 7.2 g/dL (ref 6.0–8.3)

## 2020-02-12 LAB — CBC WITH DIFFERENTIAL/PLATELET
Basophils Absolute: 0.1 10*3/uL (ref 0.0–0.1)
Basophils Relative: 1.2 % (ref 0.0–3.0)
Eosinophils Absolute: 0.3 10*3/uL (ref 0.0–0.7)
Eosinophils Relative: 5.2 % — ABNORMAL HIGH (ref 0.0–5.0)
HCT: 46.2 % — ABNORMAL HIGH (ref 36.0–46.0)
Hemoglobin: 15.5 g/dL — ABNORMAL HIGH (ref 12.0–15.0)
Lymphocytes Relative: 34.4 % (ref 12.0–46.0)
Lymphs Abs: 2 10*3/uL (ref 0.7–4.0)
MCHC: 33.5 g/dL (ref 30.0–36.0)
MCV: 88 fl (ref 78.0–100.0)
Monocytes Absolute: 0.5 10*3/uL (ref 0.1–1.0)
Monocytes Relative: 8.1 % (ref 3.0–12.0)
Neutro Abs: 3 10*3/uL (ref 1.4–7.7)
Neutrophils Relative %: 51.1 % (ref 43.0–77.0)
Platelets: 227 10*3/uL (ref 150.0–400.0)
RBC: 5.25 Mil/uL — ABNORMAL HIGH (ref 3.87–5.11)
RDW: 13.4 % (ref 11.5–15.5)
WBC: 5.9 10*3/uL (ref 4.0–10.5)

## 2020-02-12 LAB — LDL CHOLESTEROL, DIRECT: Direct LDL: 116 mg/dL

## 2020-02-12 LAB — BRAIN NATRIURETIC PEPTIDE: Pro B Natriuretic peptide (BNP): 43 pg/mL (ref 0.0–100.0)

## 2020-02-12 LAB — TSH: TSH: 1.17 u[IU]/mL (ref 0.35–4.50)

## 2020-02-12 LAB — LIPID PANEL
Cholesterol: 208 mg/dL — ABNORMAL HIGH (ref 0–200)
HDL: 54.6 mg/dL (ref >39.00)
NonHDL: 153.46
Total CHOL/HDL Ratio: 4
Triglycerides: 215 mg/dL — ABNORMAL HIGH (ref 0.0–149.0)
VLDL: 43 mg/dL — ABNORMAL HIGH (ref 0.0–40.0)

## 2020-02-12 MED ORDER — FLUTICASONE PROPIONATE (INHAL) 250 MCG/BLIST IN AEPB: 1.0000 | INHALATION_SPRAY | Freq: Two times a day (BID) | RESPIRATORY_TRACT | 3 refills | Status: DC

## 2020-02-12 MED ORDER — ALBUTEROL SULFATE HFA 108 (90 BASE) MCG/ACT IN AERS: 2.0000 | INHALATION_SPRAY | Freq: Four times a day (QID) | RESPIRATORY_TRACT | 0 refills | Status: DC | PRN

## 2020-02-12 NOTE — Assessment & Plan Note (Signed)
Symptoms concerning for possible CHF with difficulty laying flat - will get ECHO, BNP. CXR with some possible edema. Will follow-up final read. Discussed trial of lasix if edema or elevated BNP. Cardiology referral as well to consider additional work. EKG with ectopic beat but otherwise normal.

## 2020-02-12 NOTE — Assessment & Plan Note (Signed)
Had stable [place] in 06/2019, however, new dizziness is worrisome. Referral to cardiology to consider additional work-up.

## 2020-02-12 NOTE — Patient Instructions (Addendum)
Dizziness - this may be related to your blood pressure - Start taking 1/2 pill of losartan - monitor blood pressure - make sure you are staying hydrated and do slow transitions  Shortness of breath - labs today - echocardiogram - try albuterol inhaler

## 2020-02-12 NOTE — Assessment & Plan Note (Signed)
Orthostatic VS are positive and initial BP is low. Decrease losartan to 25 mg daily. Home monitoring. Slow transitions.

## 2020-02-12 NOTE — Progress Notes (Signed)
Subjective:     Christine [name] is a 86 y.o. female presenting for Follow-up (6 month ), Shortness of Breath (with exertion ), and Dizziness (on and off with movement )     HPI   #Shortness of breath - w/ exertion - has an inhaler that she has been using for 5-6 years - has been using flovent prn and has needed this twice daily over the last few weeks - worse over the last 2 weeks  - worse if she does any walking - going to the grocery store having worsening symptoms  Endorses leg swelling since getting knee injections - ankles and legs  - has never had albuterol  Endorses needing extra pillow at night and sitting up SOB   #dizziness - symptoms x 1-2 months - this morning was in the shower and washing her hair - felt fine and walked into the kitchen and got dizzy - presyncopal symptoms - thought she was going to faint - denies room spinning - endorses occasional symptoms with getting out of chair or bed - will rest her head down and it get - denies palpitations or triggers - not active  Some memory issues with remembering details of a book or if she is paying attention   #depression - reads books and will forget the plot or storyline - kids are always      Review of Systems  Constitutional: Negative for chills and fever.  HENT: Positive for sinus pressure. Negative for congestion, postnasal drip, rhinorrhea and sinus pain.   Respiratory: Positive for cough and shortness of breath. Negative for wheezing.   Cardiovascular: Positive for leg swelling. Negative for chest pain and palpitations.     Social History   Tobacco Use  Smoking Status Never Smoker  Smokeless Tobacco Never Used        Objective:    BP Readings from Last 3 Encounters:  02/12/20 110/60  08/13/19 118/62  06/03/19 (!) 142/78   Wt Readings from Last 3 Encounters:  02/12/20 181 lb 12 oz (82.4 kg)  08/13/19 182 lb (82.6 kg)  06/03/19 182 lb 4 oz (82.7 kg)    BP 110/60   Pulse  66   Temp (!) 97.2 F (36.2 C) (Temporal)   Ht 5\' 6"  (1.676 m)   Wt 181 lb 12 oz (82.4 kg)   SpO2 97%   BMI 29.34 kg/m    Physical Exam Constitutional:      General: She is not in acute distress.    Appearance: She is well-developed. She is not diaphoretic.  HENT:     Head: Normocephalic and atraumatic.     Right Ear: External ear normal.     Left Ear: External ear normal.     Nose: Nose normal.  Eyes:     Conjunctiva/sclera: Conjunctivae normal.  Cardiovascular:     Rate and Rhythm: Normal rate and regular rhythm.     Heart sounds: No murmur heard.   Pulmonary:     Effort: Pulmonary effort is normal.     Breath sounds: Examination of the right-lower field reveals rales. Examination of the left-lower field reveals rales. Rales present.  Musculoskeletal:     Cervical back: Neck supple.  Skin:    General: Skin is warm and dry.     Capillary Refill: Capillary refill takes less than 2 seconds.  Neurological:     Mental Status: She is alert. Mental status is at baseline.  Psychiatric:        Mood  and Affect: Mood normal.        Behavior: Behavior normal.      EKG: NSR, ectopic beat. No ST changes, no Twave abnormalities     Assessment & Plan:   Problem List Items Addressed This Visit      Cardiovascular and Mediastinum   Essential hypertension    Orthostatic VS are positive and initial BP is low. Decrease losartan to 25 mg daily. Home monitoring. Slow transitions.       Relevant Orders   ECHOCARDIOGRAM COMPLETE   Ambulatory referral to Cardiology   Internal carotid artery stenosis, right    Had stable [place] in 06/2019, however, new dizziness is worrisome. Referral to cardiology to consider additional work-up.       Relevant Orders   Ambulatory referral to Cardiology     Respiratory   Mild intermittent asthma without complication    Cont flovent BID scheduled. Albuterol prn trial to see if that helps with DOE.       Relevant Medications   Fluticasone  Propionate, Inhal, 250 MCG/BLIST AEPB   albuterol (VENTOLIN HFA) 108 (90 Base) MCG/ACT inhaler   Other Relevant Orders   DG Chest 2 View     Other   Hyperlipidemia   Relevant Orders   Lipid panel   Dyspnea on exertion - Primary    Symptoms concerning for possible CHF with difficulty laying flat - will get ECHO, BNP. CXR with some possible edema. Will follow-up final read. Discussed trial of lasix if edema or elevated BNP. Cardiology referral as well to consider additional work. EKG with ectopic beat but otherwise normal.       Relevant Medications   albuterol (VENTOLIN HFA) 108 (90 Base) MCG/ACT inhaler   Other Relevant Orders   EKG 12-Lead (Completed)   Comprehensive metabolic panel   Brain natriuretic peptide   TSH   CBC with Differential   ECHOCARDIOGRAM COMPLETE   DG Chest 2 View   Ambulatory referral to Cardiology    Other Visit Diagnoses    Orthostatic hypotension       Relevant Orders   Comprehensive metabolic panel   Brain natriuretic peptide   TSH   CBC with Differential   ECHOCARDIOGRAM COMPLETE   Ambulatory referral to Cardiology       Return in about 2 weeks (around 02/26/2020) for for depression and check in.  [name], MD  This visit occurred during the SARS-CoV-2 public health emergency.  Safety protocols were in place, including screening questions prior to the visit, additional usage of staff PPE, and extensive cleaning of exam room while observing appropriate contact time as indicated for disinfecting solutions.

## 2020-02-12 NOTE — Assessment & Plan Note (Signed)
Cont flovent BID scheduled. Albuterol prn trial to see if that helps with DOE.

## 2020-02-13 ENCOUNTER — Encounter: Payer: Self-pay | Admitting: Internal Medicine

## 2020-02-13 ENCOUNTER — Ambulatory Visit (INDEPENDENT_AMBULATORY_CARE_PROVIDER_SITE_OTHER): Payer: Medicare Other | Admitting: Internal Medicine

## 2020-02-13 VITALS — BP 156/90 | HR 75 | Ht 66.0 in | Wt 184.0 lb

## 2020-02-13 DIAGNOSIS — R5383 Other fatigue: Secondary | ICD-10-CM | POA: Diagnosis not present

## 2020-02-13 DIAGNOSIS — R0602 Shortness of breath: Secondary | ICD-10-CM

## 2020-02-13 DIAGNOSIS — I10 Essential (primary) hypertension: Secondary | ICD-10-CM

## 2020-02-13 DIAGNOSIS — I6529 Occlusion and stenosis of unspecified carotid artery: Secondary | ICD-10-CM

## 2020-02-13 NOTE — Patient Instructions (Signed)
Medication Instructions:  Your physician recommends that you continue on your current medications as directed. Please refer to the Current Medication list given to you today.  *If you need a refill on your cardiac medications before your next appointment, please call your pharmacy*  Lab Work: none If you have labs (blood work) drawn today and your tests are completely normal, you will receive your results only by: Marland Kitchen MyChart Message (if you have MyChart) OR . A paper copy in the mail If you have any lab test that is abnormal or we need to change your treatment, we will call you to review the results.  Testing/Procedures: Your physician has requested that you have an echocardiogram. Echocardiography is a painless test that uses sound waves to create images of your heart. It provides your doctor with information about the size and shape of your heart and how well your heart's chambers and valves are working. This procedure takes approximately one hour. There are no restrictions for this procedure. There is a possibility that an IV may need to be started during your test to inject an image enhancing agent. This is done to obtain more optimal pictures of your heart. Therefore we ask that you do at least drink some water prior to coming in to hydrate your veins.   Follow-Up: At [facility], you and your health needs are our priority.  As part of our continuing mission to provide you with exceptional heart care, we have created designated Provider Care Teams.  These Care Teams include your primary Cardiologist (physician) and Advanced Practice Providers (APPs -  Physician Assistants and Nurse Practitioners) who all work together to provide you with the care you need, when you need it.  We recommend signing up for the patient portal called "MyChart".  Sign up information is provided on this After Visit Summary.  MyChart is used to connect with patients for Virtual Visits (Telemedicine).  Patients are  able to view lab/test results, encounter notes, upcoming appointments, etc.  Non-urgent messages can be sent to your provider as well.   To learn more about what you can do with MyChart, go to [email or web address].    Your next appointment:   1 month(s)  The format for your next appointment:   In Person  Provider:   You may see DR Harrell Gave END or one of the following Advanced Practice Providers on your designated Care Team:    Murray Hodgkins, NP  Christell Faith, PA-C  Marrianne Mood, PA-C  Cadence Toledo, [place]  Laurann Montana, NP    Echocardiogram An echocardiogram is a procedure that uses painless sound waves (ultrasound) to produce an image of the heart. Images from an echocardiogram can provide important information about:  Signs of coronary artery disease (CAD).  Aneurysm detection. An aneurysm is a weak or damaged part of an artery wall that bulges out from the normal force of blood pumping through the body.  Heart size and shape. Changes in the size or shape of the heart can be associated with certain conditions, including heart failure, aneurysm, and CAD.  Heart muscle function.  Heart valve function.  Signs of a past heart attack.  Fluid buildup around the heart.  Thickening of the heart muscle.  A tumor or infectious growth around the heart valves. Tell a health care provider about:  Any allergies you have.  All medicines you are taking, including vitamins, herbs, eye drops, creams, and over-the-counter medicines.  Any blood disorders you have.  Any surgeries you have  had.  Any medical conditions you have.  Whether you are pregnant or may be pregnant. What are the risks? Generally, this is a safe procedure. However, problems may occur, including:  Allergic reaction to dye (contrast) that may be used during the procedure. What happens before the procedure? No specific preparation is needed. You may eat and drink normally. What happens during  the procedure?   An IV tube may be inserted into one of your veins.  You may receive contrast through this tube. A contrast is an injection that improves the quality of the pictures from your heart.  A gel will be applied to your chest.  A wand-like tool (transducer) will be moved over your chest. The gel will help to transmit the sound waves from the transducer.  The sound waves will harmlessly bounce off of your heart to allow the heart images to be captured in real-time motion. The images will be recorded on a computer. The procedure may vary among health care providers and hospitals. What happens after the procedure?  You may return to your normal, everyday life, including diet, activities, and medicines, unless your health care provider tells you not to do that. Summary  An echocardiogram is a procedure that uses painless sound waves (ultrasound) to produce an image of the heart.  Images from an echocardiogram can provide important information about the size and shape of your heart, heart muscle function, heart valve function, and fluid buildup around your heart.  You do not need to do anything to prepare before this procedure. You may eat and drink normally.  After the echocardiogram is completed, you may return to your normal, everyday life, unless your health care provider tells you not to do that. This information is not intended to replace advice given to you by your health care provider. Make sure you discuss any questions you have with your health care provider. Document Revised: 06/20/2018 Document Reviewed: 04/01/2016 Elsevier Patient Education  [employer].

## 2020-02-13 NOTE — Progress Notes (Signed)
New Outpatient Visit Date: 02/13/2020  Referring Provider: [name], [place],  [place] [address]  Chief Complaint: Shortness of breath and dizziness  HPI:  Ms. Guisinger is a 86 y.o. female who is being seen today for the evaluation of shortness of breath and dizziness at the request of Dr. Einar Pheasant. She has a history of hypertension, hyperlipidemia, rheumatic fever, asthma, GERD, and thyroid dysfunction.  She was seen by Dr. Einar Pheasant yesterday and complained of orthopnea, leg swelling, and dizziness.  She was noted to have orthostatic hypotension, prompting de-escalation of losartan.  Ms. Roane reports that she began to feel very tired and short of breath in September.  There was no clear precipitant for this.  She has been using an inhaler nightly without much improvement.  Shortness of breath is present with minimal exertion.  She denies chest pain other than occasional cramps in the chest wall when turning certain ways.  Ms. Wassmer notes frequent orthostatic lightheadedness.  She has also experienced intermittent brief palpitations that are not associated with her lightheadedness.  She fell once a few months ago for unknown reasons, tough she does not believe it was caused by lightheadedness or syncope.  Ms. Sedgwick reports having undergone a heart catheterization in [place] over 20 years ago.  She believes some plaque was identified but no intervention was undertaken.  She also had rheumatic fever as a child.  She does not know if any valvular heart disease was ever identified.  She is now taking losartan 25 mg daily, as instructed by Dr. Einar Pheasant at her visit yesterday.  --------------------------------------------------------------------------------------------------  Cardiovascular History & Procedures: Cardiovascular Problems:  Shortness of breath  Orthostatic hypotension  Risk Factors:  Hypertension, hyperlipidemia, and age greater than  50  Cath/PCI:  Nove available.  Patient reports LHC at outside hospital >20 years ago with possible non-obstructive CAD.  CV Surgery:  None  EP Procedures and Devices:  None  Non-Invasive Evaluation(s):  Carotid Doppler (06/23/2019): Moderate right ICA plaque with 50-69% stenosis.  Left carotid atherosclerosis without significant stenosis.  Recent CV Pertinent Labs: Lab Results  Component Value Date   CHOL 208 (H) 02/12/2020   HDL 54.60 02/12/2020   LDLCALC 124 (H) 08/13/2019   LDLDIRECT 116.0 02/12/2020   TRIG 215.0 (H) 02/12/2020   CHOLHDL 4 02/12/2020   K 4.8 02/12/2020   MG 2.0 12/12/2018   BUN 23 02/12/2020   CREATININE 1.23 (H) 02/12/2020    --------------------------------------------------------------------------------------------------  Past Medical History:  Diagnosis Date  . Asthma   . Chicken pox   . Frequent headaches   . GERD (gastroesophageal reflux disease)   . Heart murmur   . Hyperlipidemia   . Hypertension   . Rheumatic fever   . Thyroid disorder   . Urine incontinence     Past Surgical History:  Procedure Laterality Date  . ABDOMINAL HYSTERECTOMY    . APPENDECTOMY    . BREAST BIOPSY      Current Meds  Medication Sig  . albuterol (VENTOLIN HFA) 108 (90 Base) MCG/ACT inhaler Inhale 2 puffs into the lungs every 6 (six) hours as needed for wheezing or shortness of breath.  Marland Kitchen aspirin EC 325 MG tablet Take 1 tablet (325 mg total) by mouth daily.  . fenofibrate 160 MG tablet Take 1 tablet (160 mg total) by mouth daily.  . Fluticasone Propionate, Inhal, 250 MCG/BLIST AEPB Inhale 1 puff into the lungs in the morning and at bedtime.  Marland Kitchen levothyroxine (SYNTHROID) 75  MCG tablet Take 1 tablet (75 mcg total) by mouth daily.  Marland Kitchen loratadine (CLARITIN) 10 MG tablet TAKE 1 TABLET DAILY  . losartan (COZAAR) 50 MG tablet Take 25 mg by mouth daily.  . RABEprazole (ACIPHEX) 20 MG tablet Take 1 tablet (20 mg total) by mouth daily.  . [DISCONTINUED] losartan  (COZAAR) 50 MG tablet Take 1 tablet (50 mg total) by mouth daily.    Allergies: Pollen extract  Social History   Tobacco Use  . Smoking status: Never Smoker  . Smokeless tobacco: Never Used  Vaping Use  . Vaping Use: Never used  Substance Use Topics  . Alcohol use: Yes    Comment: maybe once a year  . Drug use: Never    Family History  Problem Relation Age of Onset  . Early death Mother   . Kidney disease Mother        bright disease  . Other Father        tumor on the side of his leg  . Heart disease Brother   . Heart attack Brother 66  . Leukemia Daughter   . Heart disease Brother 67  . Valvular heart disease Daughter        s/p mitral valve replacement    Review of Systems: A 12-system review of systems was performed and was negative except as noted in the HPI.  --------------------------------------------------------------------------------------------------  Physical Exam: BP (!) 156/90   Pulse 75   Ht 5\' 6"  (1.676 m)   Wt 184 lb (83.5 kg)   BMI 29.70 kg/m   General:  NAD. HEENT: No conjunctival pallor or scleral icterus. Facemask in place. Neck: Supple without lymphadenopathy, thyromegaly, JVD, or HJR. Left carotid bruit present. Lungs: Normal work of breathing. Clear to auscultation bilaterally without wheezes or crackles. Heart: Regular rate and rhythm without murmurs, rubs, or gallops. Non-displaced PMI. Abd: Bowel sounds present. Soft, NT/ND without hepatosplenomegaly Ext: No lower extremity edema. Radial, PT, and DP pulses are 2+ bilaterally Skin: Warm and dry without rash. Neuro: CNIII-XII intact. Strength and fine-touch sensation intact in upper and lower extremities bilaterally. Psych: Normal mood and affect.  EKG:  Normal sinus rhythm without abnormality.  Lab Results  Component Value Date   WBC 5.9 02/12/2020   HGB 15.5 (H) 02/12/2020   HCT 46.2 (H) 02/12/2020   MCV 88.0 02/12/2020   PLT 227.0 02/12/2020    Lab Results  Component  Value Date   NA 138 02/12/2020   K 4.8 02/12/2020   CL 100 02/12/2020   CO2 29 02/12/2020   BUN 23 02/12/2020   CREATININE 1.23 (H) 02/12/2020   GLUCOSE 106 (H) 02/12/2020   ALT 27 02/12/2020    Lab Results  Component Value Date   CHOL 208 (H) 02/12/2020   HDL 54.60 02/12/2020   LDLCALC 124 (H) 08/13/2019   LDLDIRECT 116.0 02/12/2020   TRIG 215.0 (H) 02/12/2020   CHOLHDL 4 02/12/2020     --------------------------------------------------------------------------------------------------  ASSESSMENT AND PLAN: Shortness of breath and fatigue: Symptoms are non-specific but began fairly abruptly 2-3 months ago.  Examination today is unrevealing.  Concerns include heart failure (potentially due to underlying valvular heart disease with history of rheumatic fever as a child) and coronary artery disease.  Underlying pulmonary process is also a concern.  Labs yesterday did not show any significant abnormalities to explain her symptoms.  I agree with obtaining a transthoracic echocardiogram as soon as possible.  If there is no evidence of valvular heart disease, I would recommend  moving forward with non-invasive ischemia testing.  If cardiomyopathy or severe valvular heart disease in uncovered, it would be prudent to consider [facility] for further evaluation.  In the meantime, Ms. Diep should continue her current medication.  Carotid artery stenosis and hyperlipidemia: Moderate disease noted on recent Doppler exam.  Left carotid bruit also noted on exam today.  Aspirin 81 mg daily should be continued.  Addition of statin therapy for target LDL < 70 is also recommended; I will defer this to Dr. Einar Pheasant.  Fenofibrate can be continued for now in the setting of hypertriglyceridemia.  Hypertension: Blood pressure mildly elevated today.  Orthostatic hypotension noted by Dr. Einar Pheasant yesterday, prompting deescalation of losartan.  We will defer medication changes today.  I have encouraged Ms. Bluett to  remain well-hydrated and to minimize her sodium intake.  Follow-up: Return to clinic in 1 month.  Nelva Bush, MD 02/13/2020 10:19 AM

## 2020-02-15 ENCOUNTER — Encounter: Payer: Self-pay | Admitting: Internal Medicine

## 2020-02-15 DIAGNOSIS — R5383 Other fatigue: Secondary | ICD-10-CM | POA: Insufficient documentation

## 2020-02-26 ENCOUNTER — Telehealth: Payer: Self-pay | Admitting: Family Medicine

## 2020-02-26 NOTE — Telephone Encounter (Signed)
Patient states that she received a call from [place] no notes in system EM

## 2020-03-02 ENCOUNTER — Other Ambulatory Visit: Payer: Self-pay

## 2020-03-02 ENCOUNTER — Ambulatory Visit (INDEPENDENT_AMBULATORY_CARE_PROVIDER_SITE_OTHER): Payer: Medicare Other | Admitting: Family Medicine

## 2020-03-02 VITALS — BP 130/62 | HR 58 | Temp 96.5°F | Ht 66.0 in | Wt 178.0 lb

## 2020-03-02 DIAGNOSIS — F321 Major depressive disorder, single episode, moderate: Secondary | ICD-10-CM | POA: Diagnosis not present

## 2020-03-02 DIAGNOSIS — R0602 Shortness of breath: Secondary | ICD-10-CM

## 2020-03-02 MED ORDER — ESCITALOPRAM OXALATE 5 MG PO TABS: 5.0000 mg | ORAL_TABLET | Freq: Every day | ORAL | 1 refills | Status: DC

## 2020-03-02 NOTE — Assessment & Plan Note (Signed)
Persistent symptoms. Saw cardiology and reviewed need for work-up. Has echo in 1 week. Some improvement with albuterol but not resolving symptoms

## 2020-03-02 NOTE — Progress Notes (Signed)
Subjective:     Christine [name] is a 86 y.o. female presenting for Follow-up     HPI   #DOE - using inhaler a little more often - a few months ago could walk her apartment complex trail several times - now she is cannot even walk to the office - inhaler seems to help  #depression - just doesn't feel herself - tearful - has noticed some memory issues - doing good with numbers still  - forgot the last name of a longtime friend - will suddenly start crying for no reason - 3-6 months - before it would be every once in a while and now it is happening more often - now last 2-3 months crying a lot -      Review of Systems  02/12/2020: Clinic - HTN/DOE - orthostatic hypotension - decrease losartan. Asthma - flovent BID and prn albuterol. ECHO and cardiology referral. BNP normal 02/13/2020: Cardiology - carotid artery stenosis - consider statin therapy LDL<70. Echo and if normal consider ischemia testing  Social History   Tobacco Use  Smoking Status Never Smoker  Smokeless Tobacco Never Used        Objective:    BP Readings from Last 3 Encounters:  03/02/20 130/62  02/13/20 (!) 156/90  02/12/20 110/60   Wt Readings from Last 3 Encounters:  03/02/20 178 lb (80.7 kg)  02/13/20 184 lb (83.5 kg)  02/12/20 181 lb 12 oz (82.4 kg)    BP 130/62   Pulse (!) 58   Temp (!) 96.5 F (35.8 C) (Temporal)   Ht 5\' 6"  (1.676 m)   Wt 178 lb (80.7 kg)   SpO2 94%   BMI 28.73 kg/m    Physical Exam Constitutional:      General: She is not in acute distress.    Appearance: She is well-developed. She is not diaphoretic.  HENT:     Right Ear: External ear normal.     Left Ear: External ear normal.  Eyes:     Conjunctiva/sclera: Conjunctivae normal.  Cardiovascular:     Rate and Rhythm: Normal rate.  Pulmonary:     Effort: Pulmonary effort is normal.  Musculoskeletal:     Cervical back: Neck supple.  Skin:    General: Skin is warm and dry.     Capillary Refill:  Capillary refill takes less than 2 seconds.  Neurological:     Mental Status: She is alert. Mental status is at baseline.  Psychiatric:        Attention and Perception: Attention normal.        Mood and Affect: Mood normal. Affect is tearful.        Behavior: Behavior normal.    MOCA 28/30  Depression screen [facility] 2/9 02/12/2020 12/12/2018  Decreased Interest 3 0  Down, Depressed, Hopeless 3 0  PHQ - 2 Score 6 0  Altered sleeping 3 -  Tired, decreased energy 2 -  Change in appetite 3 -  Feeling bad or failure about yourself  1 -  Trouble concentrating 2 -  Moving slowly or fidgety/restless 3 -  Suicidal thoughts 0 -  PHQ-9 Score 20 -  Difficult doing work/chores Somewhat difficult -        Assessment & Plan:   Problem List Items Addressed This Visit      Other   Shortness of breath    Persistent symptoms. Saw cardiology and reviewed need for work-up. Has echo in 1 week. Some improvement with albuterol but not resolving  symptoms      Current moderate episode of major depressive disorder without prior episode (Christine [name]) - Primary    New onset depression. MOCA 28/30 is reassuring though she is noticing some difficulty. Long discussion regarding therapy and medication. Will start lexapro 5 mg and increase as needed. Return in 6 weeks if limited or no improvement will readdress therapy.       Relevant Medications   escitalopram (LEXAPRO) 5 MG tablet       Return in about 6 weeks (around 04/13/2020).  [name], MD  This visit occurred during the SARS-CoV-2 public health emergency.  Safety protocols were in place, including screening questions prior to the visit, additional usage of staff PPE, and extensive cleaning of exam room while observing appropriate contact time as indicated for disinfecting solutions.

## 2020-03-02 NOTE — Patient Instructions (Addendum)
Call in 3 weeks if doing ok with medication and wanting to consider a dose increase   You are going to start a new antidepressant medication.   One of the risks of this medication is increase in suicidal thoughts.   Your suicide Action plan is as follows:  1) Call daughters 2) Call the Suicide Hotline [phone] which is available 24 hours 3) Call the Clinic    The most common side effect is stomach upset. If this happens it means the medication is working. It should get better in 1-3 weeks.   Medication for depression and anxiety often takes 6-8 weeks to have a noticeable difference so stick with it. Also the best way for recovery is taking medication and seeing a therapist -- this is so important.    How to help anxiety and depression  1) Regular Exercise - walking, jogging, cycling, dancing, strength training - aiming for 150 minutes of exercise a week --> Yoga has been shown in research to reduce depression and anxiety -- with even just one hour long session per week  2)  Begin a Mindfulness/Meditation practice -- this can take a little as 3 minutes and is helpful for all kinds of mood issues -- You can find resources in books -- Or you can download apps like  ---- Headspace App  ---- Calm  ---- Insignt Timer ---- Stop, Breathe & Think  # With each of these Apps - you should decline the "start free trial" offer and as you search through the App should be able to access some of their free content. You can also chose to pay for the content if you find one that works well for you.   # Many of them also offer sleep specific content which may help with insomnia  3) Healthy Diet -- Avoid or decrease Caffeine -- Avoid or decrease Alcohol -- Drink plenty of water, have a balanced diet -- Avoid cigarettes and marijuana (as well as other recreational drugs)

## 2020-03-02 NOTE — Assessment & Plan Note (Signed)
New onset depression. MOCA 28/30 is reassuring though she is noticing some difficulty. Long discussion regarding therapy and medication. Will start lexapro 5 mg and increase as needed. Return in 6 weeks if limited or no improvement will readdress therapy.

## 2020-03-04 ENCOUNTER — Other Ambulatory Visit: Payer: Self-pay | Admitting: Family Medicine

## 2020-03-04 DIAGNOSIS — R0609 Other forms of dyspnea: Secondary | ICD-10-CM

## 2020-03-09 ENCOUNTER — Ambulatory Visit (INDEPENDENT_AMBULATORY_CARE_PROVIDER_SITE_OTHER): Payer: Medicare Other

## 2020-03-09 ENCOUNTER — Other Ambulatory Visit: Payer: Self-pay

## 2020-03-09 DIAGNOSIS — R0600 Dyspnea, unspecified: Secondary | ICD-10-CM | POA: Diagnosis not present

## 2020-03-09 DIAGNOSIS — R0609 Other forms of dyspnea: Secondary | ICD-10-CM

## 2020-03-09 LAB — ECHOCARDIOGRAM COMPLETE
AR max vel: 2.17 cm2
AV Area VTI: 2.33 cm2
AV Area mean vel: 2.34 cm2
AV Mean grad: 4 mmHg
AV Peak grad: 8.4 mmHg
Ao pk vel: 1.45 m/s
Area-P 1/2: 3.17 cm2
Calc EF: 57.3 %
S' Lateral: 2.8 cm
Single Plane A2C EF: 53 %
Single Plane A4C EF: 60.9 %

## 2020-03-16 ENCOUNTER — Telehealth: Payer: Self-pay | Admitting: Internal Medicine

## 2020-03-16 NOTE — Telephone Encounter (Signed)
Heart is contracting well.  There is mild thickening of her heart valves but no significant abnormality to explain her sx.  She should f/u as planned later this month to reassess her sx and discuss need for additional testing.  Yvonne Kendall, MD [facility]

## 2020-03-16 NOTE — Telephone Encounter (Signed)
Patient calling  Would like to hear results from ECHO  Please call to discuss

## 2020-03-17 NOTE — Telephone Encounter (Signed)
The patient has been notified of the result and verbalized understanding.  All questions (if any) were answered. Sherlynn Stalls Chelbie Jarnagin, RN 03/17/2020 8:55 AM

## 2020-03-31 NOTE — Progress Notes (Signed)
Follow-up Outpatient Visit Date: 04/01/2020  Primary Care Provider: [name], [place] [id number]  Chief Complaint: Follow-up shortness of breath and dizziness  HPI:  Christine [name] is a 87 y.o. female with history of HTN, HLD, rheumatic fever, asthma, GERD, and thyroid dysfunction, who presents for follow-up of shortness of breath and dizziness.  I met her last month, which time she reported worsening fatigue and dyspnea beginning in 11/2019.  She also complained of frequent orthostatic lightheadedness and intermittent palpitations.  Subsequent echo showed preserved LVEF with grade 1 diastolic dysfunction degenerative mitral valve disease with trivial regurgitation and no stenosis, and aortic sclerosis without stenosis.  Today, Christine [name] reports that she feels about the same as at our last visit with persistent exertional dyspnea with mild activity like walking across a parking lot.  She also notes left-sided chest pain when she moves/turns in certain ways.  She describes it like a taught elastic band.  She also developed abdominal and upper back pain (between shoulder blades) ~6 days ago.  It was a nagging pain that improved with a heating pad.  Pain has been absent for the last 2 days.  Occasional orthostatic lightheadedness remains.  Home BP is usually ~110/70.  --------------------------------------------------------------------------------------------------  Cardiovascular History & Procedures: Cardiovascular Problems:  Shortness of breath  Orthostatic hypotension  Risk Factors:  Hypertension, hyperlipidemia, and age greater than 36  Cath/PCI:  Nove available.  Patient reports LHC at outside hospital >20 years ago with possible non-obstructive CAD.  CV Surgery:  None  EP Procedures and Devices:  None  Non-Invasive Evaluation(s):  TTE (03/09/2020): Normal LV size with borderline LVH.  LVEF 60-65% with grade 1 diastolic dysfunction.   Normal RV size and function.  Normal biatrial size.  Degenerative mitral valve disease with trivial regurgitation and no stenosis.  Aortic sclerosis without stenosis.  Carotid Doppler (06/23/2019): Moderate right ICA plaque with 50-69% stenosis.  Left carotid atherosclerosis without significant stenosis.  Recent CV Pertinent Labs: Lab Results  Component Value Date   CHOL 208 (H) 02/12/2020   HDL 54.60 02/12/2020   LDLCALC 124 (H) 08/13/2019   LDLDIRECT 116.0 02/12/2020   TRIG 215.0 (H) 02/12/2020   CHOLHDL 4 02/12/2020   K 4.3 04/01/2020   MG 2.0 12/12/2018   BUN 15 04/01/2020   CREATININE 1.07 (H) 04/01/2020    Past medical and surgical history were reviewed and updated in EPIC.  Current Meds  Medication Sig   aspirin EC 325 MG tablet Take 1 tablet (325 mg total) by mouth daily.   escitalopram (LEXAPRO) 5 MG tablet Take 1 tablet (5 mg total) by mouth daily.   fenofibrate 160 MG tablet Take 1 tablet (160 mg total) by mouth daily.   Fluticasone Propionate, Inhal, 250 MCG/BLIST AEPB Inhale 1 puff into the lungs in the morning and at bedtime.   levothyroxine (SYNTHROID) 75 MCG tablet Take 1 tablet (75 mcg total) by mouth daily.   loratadine (CLARITIN) 10 MG tablet TAKE 1 TABLET DAILY (Patient taking differently: Take 10 mg by mouth daily as needed.)   losartan (COZAAR) 50 MG tablet Take 25 mg by mouth daily.   RABEprazole (ACIPHEX) 20 MG tablet Take 1 tablet (20 mg total) by mouth daily.   [DISCONTINUED] metoprolol tartrate (LOPRESSOR) 25 MG tablet Take 1 tablet (25 mg total) by mouth once for 1 dose. Take 2 hours prior to Cardiac CTA.    Allergies: Pollen extract  Social History   Tobacco Use  Smoking status: Never Smoker   Smokeless tobacco: Never Used  Vaping Use   Vaping Use: Never used  Substance Use Topics   Alcohol use: Yes    Comment: maybe once a year   Drug use: Never    Family History  Problem Relation Age of Onset   Early death Mother     Kidney disease Mother        bright disease   Other Father        tumor on the side of his leg   Heart disease Brother    Heart attack Brother 100   Leukemia Daughter    Heart disease Brother 41   Valvular heart disease Daughter        s/p mitral valve replacement    Review of Systems: A 12-system review of systems was performed and was negative except as noted in the HPI.  --------------------------------------------------------------------------------------------------  Physical Exam: BP (!) 148/76 (BP Location: Left Arm, Patient Position: Sitting, Cuff Size: Normal)    Pulse 62    Ht '5\' 6"'  (1.676 m)    Wt 174 lb (78.9 kg)    SpO2 98%    BMI 28.08 kg/m   General:  NAD. Neck: No JVD or HJR. Lungs: Clear to auscultation bilaterally without wheezes or crackles. Heart: Regular rate and rhythm with 1/6 systolic murmur.  No rubs or gallops. Abdomen: Soft, nontender, nondistended. Extremities: No lower extremity edema.  Lab Results  Component Value Date   WBC 5.9 02/12/2020   HGB 15.5 (H) 02/12/2020   HCT 46.2 (H) 02/12/2020   MCV 88.0 02/12/2020   PLT 227.0 02/12/2020    Lab Results  Component Value Date   NA 142 04/01/2020   K 4.3 04/01/2020   CL 103 04/01/2020   CO2 21 04/01/2020   BUN 15 04/01/2020   CREATININE 1.07 (H) 04/01/2020   GLUCOSE 105 (H) 04/01/2020   ALT 27 02/12/2020    Lab Results  Component Value Date   CHOL 208 (H) 02/12/2020   HDL 54.60 02/12/2020   LDLCALC 124 (H) 08/13/2019   LDLDIRECT 116.0 02/12/2020   TRIG 215.0 (H) 02/12/2020   CHOLHDL 4 02/12/2020    --------------------------------------------------------------------------------------------------  ASSESSMENT AND PLAN: Dyspnea on exertion and atypical chest pain: Dyspnea on exertion has been present for the last 4-5 months and seems stable.  Recent echo did not reveal any obvious cause for her dyspnea.  Christine [name] also reports some atypical chest and upper back pain that  sounds most consistent with musculoskeletal and/or GI pathology.  However, in light of her risk factors and relatively new exertional dyspnea, I have recommended ischemia evaluation to exclude atypical angina.  We have discussed noninvasive and invasive testing strategies and have agreed to proceed with coronary CTA.  Hypertension: Blood pressure is mildly elevated today but typically better at home.  We will continue losartan 25 mg daily.  Hyperlipidemia: Christine [name] is currently on fenofibrate, though in the setting of carotid artery stenosis and LDL > 100, statin therapy should be considered.  We will readdress this after completion of her cardiac CTA.  Follow-up: Return to clinic after completion of coronary CTA.  Nelva Bush, MD 04/02/2020 11:40 AM

## 2020-04-01 ENCOUNTER — Other Ambulatory Visit: Payer: Self-pay

## 2020-04-01 ENCOUNTER — Encounter: Payer: Self-pay | Admitting: Internal Medicine

## 2020-04-01 ENCOUNTER — Ambulatory Visit (INDEPENDENT_AMBULATORY_CARE_PROVIDER_SITE_OTHER): Payer: Medicare Other | Admitting: Internal Medicine

## 2020-04-01 VITALS — BP 148/76 | HR 62 | Ht 66.0 in | Wt 174.0 lb

## 2020-04-01 DIAGNOSIS — R931 Abnormal findings on diagnostic imaging of heart and coronary circulation: Secondary | ICD-10-CM

## 2020-04-01 DIAGNOSIS — R0600 Dyspnea, unspecified: Secondary | ICD-10-CM | POA: Diagnosis not present

## 2020-04-01 DIAGNOSIS — R079 Chest pain, unspecified: Secondary | ICD-10-CM | POA: Diagnosis not present

## 2020-04-01 DIAGNOSIS — R0609 Other forms of dyspnea: Secondary | ICD-10-CM

## 2020-04-01 DIAGNOSIS — I10 Essential (primary) hypertension: Secondary | ICD-10-CM

## 2020-04-01 DIAGNOSIS — E782 Mixed hyperlipidemia: Secondary | ICD-10-CM | POA: Diagnosis not present

## 2020-04-01 DIAGNOSIS — R0602 Shortness of breath: Secondary | ICD-10-CM | POA: Diagnosis not present

## 2020-04-01 DIAGNOSIS — I208 Other forms of angina pectoris: Secondary | ICD-10-CM

## 2020-04-01 MED ORDER — METOPROLOL TARTRATE 25 MG PO TABS: 25.0000 mg | ORAL_TABLET | Freq: Once | ORAL | 0 refills | Status: DC

## 2020-04-01 MED ORDER — METOPROLOL TARTRATE 25 MG PO TABS: 25.0000 mg | ORAL_TABLET | Freq: Once | ORAL | 3 refills | Status: DC

## 2020-04-01 NOTE — Patient Instructions (Signed)
Medication Instructions:  Your physician recommends that you continue on your current medications as directed. Please refer to the Current Medication list given to you today.  *If you need a refill on your cardiac medications before your next appointment, please call your pharmacy*   Lab Work: Your physician recommends that you return for lab work in: [place]. Needed for Cardiac CTA.   If you have labs (blood work) drawn today and your tests are completely normal, you will receive your results only by: Marland Kitchen MyChart Message (if you have MyChart) OR . A paper copy in the mail If you have any lab test that is abnormal or we need to change your treatment, we will call you to review the results.   Testing/Procedures:  Your cardiac CT will be scheduled at one of the below locations:   [facility] [address] [place], [place] [address] [phone]  [facility] [address] [place], [place] [address] [phone]  If scheduled at [facility], please arrive at the [facility] main entrance of [facility] 30 minutes prior to test start time. Proceed to the [facility] Radiology Department (first floor) to check-in and test prep.  If scheduled at [facility], please arrive 15 mins early for check-in and test prep.  Please follow these instructions carefully (unless otherwise directed):   On the Night Before the Test: . Be sure to Drink plenty of water. . Do not consume any caffeinated/decaffeinated beverages or chocolate 12 hours prior to your test. . Do not take any antihistamines 12 hours prior to your test.  On the Day of the Test: . Drink plenty of water. Do not drink any water within one hour of the test. . Do not eat any food 4 hours prior to the test. . You may take your regular medications prior to the test.  . Take metoprolol (Lopressor) two hours prior to  test. - Lopressor 25 mg once 2 hours prior to CT.  Marland Kitchen FEMALES- please wear underwire-free bra if available       After the Test: . Drink plenty of water. . After receiving IV contrast, you may experience a mild flushed feeling. This is normal. . On occasion, you may experience a mild rash up to 24 hours after the test. This is not dangerous. If this occurs, you can take Benadryl 25 mg and increase your fluid intake. . If you experience trouble breathing, this can be serious. If it is severe call 911 IMMEDIATELY. If it is mild, please call our office. . If you take any of these medications: Glipizide/Metformin, Avandament, Glucavance, please do not take 48 hours after completing test unless otherwise instructed.   Once we have confirmed authorization from your insurance company, we will call you to set up a date and time for your test. Based on how quickly your insurance processes prior authorizations requests, please allow up to 4 weeks to be contacted for scheduling your Cardiac CT appointment. Be advised that routine Cardiac CT appointments could be scheduled as many as 8 weeks after your provider has ordered it.  For non-scheduling related questions, please contact the cardiac imaging nurse navigator should you have any questions/concerns: Marchia Bond, Cardiac Imaging Nurse Navigator Burley Saver, Interim Cardiac Imaging Nurse [facility] Direct Office Dial: [phone]   For scheduling needs, including cancellations and rescheduling, please call [place], [phone].   Follow-Up: At  [facility], you and your health needs are our priority.  As part of our continuing mission to provide you with exceptional heart care, we have created designated Provider Care Teams.  These Care Teams include your primary Cardiologist (physician) and Advanced Practice Providers (APPs -  Physician Assistants and Nurse Practitioners) who all work together to provide you  with the care you need, when you need it.  We recommend signing up for the patient portal called "MyChart".  Sign up information is provided on this After Visit Summary.  MyChart is used to connect with patients for Virtual Visits (Telemedicine).  Patients are able to view lab/test results, encounter notes, upcoming appointments, etc.  Non-urgent messages can be sent to your provider as well.   To learn more about what you can do with MyChart, go to [email or web address].    Your next appointment:   4-6 week(s) after Cardiac CTA is completed.  The format for your next appointment:   In Person  Provider:   You may see DR Harrell Gave END or one of the following Advanced Practice Providers on your designated Care Team:    Murray Hodgkins, NP  Christell Faith, PA-C  Marrianne Mood, PA-C  Cadence [place], [place]  Laurann Montana, NP

## 2020-04-02 ENCOUNTER — Encounter: Payer: Self-pay | Admitting: Internal Medicine

## 2020-04-02 DIAGNOSIS — R0789 Other chest pain: Secondary | ICD-10-CM | POA: Insufficient documentation

## 2020-04-02 DIAGNOSIS — R079 Chest pain, unspecified: Secondary | ICD-10-CM | POA: Insufficient documentation

## 2020-04-02 LAB — BASIC METABOLIC PANEL
BUN/Creatinine Ratio: 14 (ref 12–28)
BUN: 15 mg/dL (ref 8–27)
CO2: 21 mmol/L (ref 20–29)
Calcium: 10.6 mg/dL — ABNORMAL HIGH (ref 8.7–10.3)
Chloride: 103 mmol/L (ref 96–106)
Creatinine, Ser: 1.07 mg/dL — ABNORMAL HIGH (ref 0.57–1.00)
GFR calc Af Amer: 55 mL/min/{1.73_m2} — ABNORMAL LOW (ref >59)
GFR calc non Af Amer: 48 mL/min/{1.73_m2} — ABNORMAL LOW (ref >59)
Glucose: 105 mg/dL — ABNORMAL HIGH (ref 65–99)
Potassium: 4.3 mmol/L (ref 3.5–5.2)
Sodium: 142 mmol/L (ref 134–144)

## 2020-04-07 ENCOUNTER — Telehealth (HOSPITAL_COMMUNITY): Payer: Self-pay | Admitting: Emergency Medicine

## 2020-04-07 NOTE — Telephone Encounter (Signed)
Reaching out to patient to offer assistance regarding upcoming cardiac imaging study; pt verbalizes understanding of appt date/time, parking situation and where to check in, pre-test NPO status and medications ordered, and verified current allergies; name and call back number provided for further questions should they arise Slayton Lubitz RN Navigator Cardiac Imaging [facility] [phone] office [phone] cell 

## 2020-04-08 ENCOUNTER — Other Ambulatory Visit: Payer: Self-pay

## 2020-04-08 ENCOUNTER — Ambulatory Visit
Admission: RE | Admit: 2020-04-08 | Discharge: 2020-04-08 | Disposition: A | Discharge status: Home / Self Care | Payer: Medicare Other | Source: Ambulatory Visit | Attending: Internal Medicine | Admitting: Internal Medicine

## 2020-04-08 DIAGNOSIS — I2510 Atherosclerotic heart disease of native coronary artery without angina pectoris: Secondary | ICD-10-CM | POA: Insufficient documentation

## 2020-04-08 DIAGNOSIS — R0600 Dyspnea, unspecified: Secondary | ICD-10-CM | POA: Diagnosis not present

## 2020-04-08 DIAGNOSIS — R079 Chest pain, unspecified: Secondary | ICD-10-CM | POA: Diagnosis not present

## 2020-04-08 DIAGNOSIS — I700 Atherosclerosis of aorta: Secondary | ICD-10-CM | POA: Insufficient documentation

## 2020-04-08 DIAGNOSIS — R0609 Other forms of dyspnea: Secondary | ICD-10-CM

## 2020-04-08 DIAGNOSIS — R931 Abnormal findings on diagnostic imaging of heart and coronary circulation: Secondary | ICD-10-CM | POA: Diagnosis not present

## 2020-04-08 MED ORDER — IOHEXOL 350 MG/ML SOLN
75.0000 mL | Freq: Once | INTRAVENOUS | Status: AC | PRN
  Administered 2020-04-08: 75 mL via INTRAVENOUS

## 2020-04-08 MED ORDER — NITROGLYCERIN 0.4 MG SL SUBL
0.8000 mg | SUBLINGUAL_TABLET | Freq: Once | SUBLINGUAL | Status: AC
  Administered 2020-04-08: 0.8 mg via SUBLINGUAL

## 2020-04-08 NOTE — Progress Notes (Signed)
Patient tolerated procedure well. Ambulate w/o difficulty. Sitting in chair drinking water provided. Encouraged to drink extra water today and reasoning explained. Verbalized understanding. All questions answered. ABC intact. No further needs. Patient aware to monitor BP at home. States she sees her Dr. On the 2nd and will keep a log. Discharge from procedure area w/o issues.

## 2020-04-08 NOTE — Progress Notes (Signed)
Patient tolerated procedure well. Ambulate w/o difficulty. Sitting in chair drinking water provided. Encouraged to drink extra water today and reasoning explained. Verbalized understanding. All questions answered. ABC intact. No further needs. Discharge from procedure area w/o issues.  

## 2020-04-09 DIAGNOSIS — I2510 Atherosclerotic heart disease of native coronary artery without angina pectoris: Secondary | ICD-10-CM | POA: Diagnosis not present

## 2020-04-13 ENCOUNTER — Encounter: Payer: Self-pay | Admitting: Family Medicine

## 2020-04-13 ENCOUNTER — Other Ambulatory Visit: Payer: Self-pay

## 2020-04-13 ENCOUNTER — Ambulatory Visit (INDEPENDENT_AMBULATORY_CARE_PROVIDER_SITE_OTHER): Payer: Medicare Other | Admitting: Family Medicine

## 2020-04-13 VITALS — BP 170/80 | HR 60 | Temp 96.9°F | Ht 66.0 in | Wt 174.5 lb

## 2020-04-13 DIAGNOSIS — E782 Mixed hyperlipidemia: Secondary | ICD-10-CM

## 2020-04-13 DIAGNOSIS — F321 Major depressive disorder, single episode, moderate: Secondary | ICD-10-CM | POA: Diagnosis not present

## 2020-04-13 DIAGNOSIS — I10 Essential (primary) hypertension: Secondary | ICD-10-CM | POA: Diagnosis not present

## 2020-04-13 MED ORDER — ESCITALOPRAM OXALATE 10 MG PO TABS: 10.0000 mg | ORAL_TABLET | Freq: Every day | ORAL | 0 refills | Status: DC

## 2020-04-13 NOTE — Patient Instructions (Addendum)
#  Blood pressure - bring your cuff to the next visit - Increase losartan back to 50 mg daily - continue home monitoring - if consistently great than 150/90 or still having some headaches -- make a nurse visit with home cuff in 2 weeks  #Depression   -increase the dose of Lexapro to 10 mg daily - return in 2 months for check - in

## 2020-04-13 NOTE — Progress Notes (Signed)
Subjective:     Christine [name] is a 87 y.o. female presenting for Follow-up (6 week )     HPI   #HTN - has been getting high bp at home - no cp - has DOE - endorses some upper back pain - no vision changes, dizziness - endorses HA - no change with HA - home BP 180-200/90-100 for AM prior to medication - sitting for 5 minutes   #Depression - feeling OK - still having some crying spells  Review of Systems   03/02/2020: Clinic - Depression, normal MOCA - start lexapro 5 mg. SOB - following with cardiology  04/01/2020: Cards - DOE - coronary CTA, Goal LDL<100 Coronary CTA - Calcium score 1844  Social History   Tobacco Use  Smoking Status Never Smoker  Smokeless Tobacco Never Used        Objective:    BP Readings from Last 3 Encounters:  04/13/20 (!) 170/80  04/08/20 105/76  04/01/20 (!) 148/76   Wt Readings from Last 3 Encounters:  04/13/20 174 lb 8 oz (79.2 kg)  04/01/20 174 lb (78.9 kg)  03/02/20 178 lb (80.7 kg)    BP (!) 170/80   Pulse 60   Temp (!) 96.9 F (36.1 C) (Temporal)   Ht 5\' 6"  (1.676 m)   Wt 174 lb 8 oz (79.2 kg)   SpO2 96%   BMI 28.17 kg/m    Physical Exam Constitutional:      General: She is not in acute distress.    Appearance: She is well-developed. She is not diaphoretic.  HENT:     Right Ear: External ear normal.     Left Ear: External ear normal.  Eyes:     Conjunctiva/sclera: Conjunctivae normal.  Cardiovascular:     Rate and Rhythm: Normal rate and regular rhythm.     Heart sounds: No murmur heard.   Pulmonary:     Effort: Pulmonary effort is normal. No respiratory distress.     Breath sounds: Normal breath sounds. No wheezing.  Musculoskeletal:     Cervical back: Neck supple.  Skin:    General: Skin is warm and dry.     Capillary Refill: Capillary refill takes less than 2 seconds.  Neurological:     Mental Status: She is alert. Mental status is at baseline.  Psychiatric:        Mood and Affect: Mood  normal.        Behavior: Behavior normal.     Depression screen [facility] 2/9 04/13/2020 02/12/2020 12/12/2018  Decreased Interest 0 3 0  Down, Depressed, Hopeless 2 3 0  PHQ - 2 Score 2 6 0  Altered sleeping 2 3 -  Tired, decreased energy 2 2 -  Change in appetite 1 3 -  Feeling bad or failure about yourself  1 1 -  Trouble concentrating 0 2 -  Moving slowly or fidgety/restless 3 3 -  Suicidal thoughts 0 0 -  PHQ-9 Score 11 20 -  Difficult doing work/chores Not difficult at all Somewhat difficult -         Assessment & Plan:   Problem List Items Addressed This Visit      Cardiovascular and Mediastinum   Essential hypertension    Blood pressure elevated in the office and at home. Increase Losartan to 50 mg (was having low bp in the past). Cont home monitoring. If elevated in 2 weeks bring cuff for nurse visit. Has Cardiology f/u in 1 month. Appreciate cardiology support.  Return here in 2 months        Other   Mixed hyperlipidemia    Has never been on statin. Reviewed coronary CT Calcium score of >1800. Discussed that she may need to switch to statin or add statin. Has cardiology f/u visit in 1 month. Discussed given age will defer decision to cardiology. Will focus on BP control today      Current moderate episode of major depressive disorder without prior episode ([place]) - Primary    Improvement on lexpro but not remission. Increase lexapro to 10 mg daily. Return 2 months.       Relevant Medications   escitalopram (LEXAPRO) 10 MG tablet       Return in about 2 months (around 06/11/2020).  [name], MD  This visit occurred during the SARS-CoV-2 public health emergency.  Safety protocols were in place, including screening questions prior to the visit, additional usage of staff PPE, and extensive cleaning of exam room while observing appropriate contact time as indicated for disinfecting solutions.

## 2020-04-13 NOTE — Assessment & Plan Note (Signed)
Has never been on statin. Reviewed coronary CT Calcium score of >1800. Discussed that she may need to switch to statin or add statin. Has cardiology f/u visit in 1 month. Discussed given age will defer decision to cardiology. Will focus on BP control today

## 2020-04-13 NOTE — Assessment & Plan Note (Addendum)
Blood pressure elevated in the office and at home. Increase Losartan to 50 mg (was having low bp in the past). Cont home monitoring. If elevated in 2 weeks bring cuff for nurse visit. Has Cardiology f/u in 1 month. Appreciate cardiology support. Return here in 2 months

## 2020-04-13 NOTE — Assessment & Plan Note (Signed)
Improvement on lexpro but not remission. Increase lexapro to 10 mg daily. Return 2 months.

## 2020-04-16 ENCOUNTER — Other Ambulatory Visit: Payer: Self-pay

## 2020-04-16 ENCOUNTER — Encounter: Payer: Self-pay | Admitting: Internal Medicine

## 2020-04-16 ENCOUNTER — Ambulatory Visit (INDEPENDENT_AMBULATORY_CARE_PROVIDER_SITE_OTHER): Payer: Medicare Other | Admitting: Internal Medicine

## 2020-04-16 VITALS — BP 160/70 | HR 64 | Ht 65.5 in | Wt 175.2 lb

## 2020-04-16 DIAGNOSIS — E782 Mixed hyperlipidemia: Secondary | ICD-10-CM | POA: Diagnosis not present

## 2020-04-16 DIAGNOSIS — I10 Essential (primary) hypertension: Secondary | ICD-10-CM

## 2020-04-16 DIAGNOSIS — I25118 Atherosclerotic heart disease of native coronary artery with other forms of angina pectoris: Secondary | ICD-10-CM | POA: Diagnosis not present

## 2020-04-16 MED ORDER — AMLODIPINE BESYLATE 2.5 MG PO TABS: 2.5000 mg | ORAL_TABLET | Freq: Every day | ORAL | 2 refills | Status: DC

## 2020-04-16 MED ORDER — ASPIRIN EC 81 MG PO TBEC: 81.0000 mg | DELAYED_RELEASE_TABLET | Freq: Every day | ORAL | 3 refills | Status: AC

## 2020-04-16 MED ORDER — AMLODIPINE BESYLATE 2.5 MG PO TABS: 2.5000 mg | ORAL_TABLET | Freq: Every day | ORAL | 0 refills | Status: DC

## 2020-04-16 MED ORDER — ATORVASTATIN CALCIUM 20 MG PO TABS: 20.0000 mg | ORAL_TABLET | Freq: Every day | ORAL | 2 refills | Status: DC

## 2020-04-16 MED ORDER — ATORVASTATIN CALCIUM 20 MG PO TABS: 20.0000 mg | ORAL_TABLET | Freq: Every day | ORAL | 0 refills | Status: DC

## 2020-04-16 NOTE — Patient Instructions (Signed)
Medication Instructions:  Your physician has recommended you make the following change in your medication:  1- DECREASE Aspirin to 81 mg by mouth once a day. 2- START Amlodipine 2.5 mg by mouth once a day. 3- START Atorvastatin 20 mg by mouth once a day.  *If you need a refill on your cardiac medications before your next appointment, please call your pharmacy*  Follow-Up: At [facility], you and your health needs are our priority.  As part of our continuing mission to provide you with exceptional heart care, we have created designated Provider Care Teams.  These Care Teams include your primary Cardiologist (physician) and Advanced Practice Providers (APPs -  Physician Assistants and Nurse Practitioners) who all work together to provide you with the care you need, when you need it.  We recommend signing up for the patient portal called "MyChart".  Sign up information is provided on this After Visit Summary.  MyChart is used to connect with patients for Virtual Visits (Telemedicine).  Patients are able to view lab/test results, encounter notes, upcoming appointments, etc.  Non-urgent messages can be sent to your provider as well.   To learn more about what you can do with MyChart, go to [email or web address].    Your next appointment:   Keep follow up appointment as scheduled.   The format for your next appointment:   In Person  Provider:   You may see DR Harrell Gave END or one of the following Advanced Practice Providers on your designated Care Team:    Murray Hodgkins, NP  Christell Faith, PA-C  Marrianne Mood, PA-C  Cadence [place], [place]  Laurann Montana, NP

## 2020-04-16 NOTE — Progress Notes (Signed)
Follow-up Outpatient Visit Date: 04/16/2020  Primary Care Provider: [name], [place] [address]  Chief Complaint: Follow-up cardiac CTA  HPI:  [name] is a 87 y.o. female with history of HTN, HLD, rheumatic fever, asthma, GERD, and thyroid dysfunction, who presents for follow-up of shortness of breath, dizziness, and abnormal cardiac CTA.  I last saw her in mid January, at which time [name] reported persistent exertional dyspnea with mild activities as well as positional left-sided chest pain.  Subsequent cardiac CTA showed significant coronary artery calcification as well as moderate RCA and LAD disease.  CT FFR significant disease in the RCA though without focal stenosis.  Today, [name] reports feeling about the same with exertional dyspnea and intermittent LE edema.  She has not had any chest pain.  Her blood pressure has continued to be elevated, prompting her PCP to increase losartan to 50 mg daily 2 days ago.  She has not had any significant lightheadedness nor syncope.  --------------------------------------------------------------------------------------------------  Cardiovascular History & Procedures: Cardiovascular Problems:  Shortness of breath  Orthostatic hypotension  Risk Factors:  Hypertension, hyperlipidemia, and age greater than 57  Cath/PCI:  Nove available. Patient reports LHC at outside hospital >20 years ago with possible non-obstructive CAD.  CV Surgery:  None  EP Procedures and Devices:  None  Non-Invasive Evaluation(s):  Cardiac CTA (04/08/2020): LMCA with mild calcification and less than 25% stenosis.  LAD heavily calcified with up to 50% stenosis in the proximal and mid segments.  Nondominant LCx without significant disease.  Dominant RCA with less than 50% stenosis throughout the vessel.  CT FFR notable for slow tapering throughout the RCA (0.6 distally).  TTE (03/09/2020): Normal LV  size with borderline LVH.  LVEF 60-65% with grade 1 diastolic dysfunction.  Normal RV size and function.  Normal biatrial size.  Degenerative mitral valve disease with trivial regurgitation and no stenosis.  Aortic sclerosis without stenosis.  Carotid Doppler (06/23/2019): Moderate right ICA plaque with 50-69% stenosis. Left carotid atherosclerosis without significant stenosis.   Recent CV Pertinent Labs: Lab Results  Component Value Date   CHOL 208 (H) 02/12/2020   HDL 54.60 02/12/2020   LDLCALC 124 (H) 08/13/2019   LDLDIRECT 116.0 02/12/2020   TRIG 215.0 (H) 02/12/2020   CHOLHDL 4 02/12/2020   K 4.3 04/01/2020   MG 2.0 12/12/2018   BUN 15 04/01/2020   CREATININE 1.07 (H) 04/01/2020    Past medical and surgical history were reviewed and updated in EPIC.  Current Meds  Medication Sig  . aspirin EC 325 MG tablet Take 1 tablet (325 mg total) by mouth daily.  Marland Kitchen escitalopram (LEXAPRO) 10 MG tablet Take 1 tablet (10 mg total) by mouth daily.  . fenofibrate 160 MG tablet Take 1 tablet (160 mg total) by mouth daily.  . Fluticasone Propionate, Inhal, 250 MCG/BLIST AEPB Inhale 1 puff into the lungs in the morning and at bedtime.  Marland Kitchen levothyroxine (SYNTHROID) 75 MCG tablet Take 1 tablet (75 mcg total) by mouth daily.  Marland Kitchen loratadine (CLARITIN) 10 MG tablet TAKE 1 TABLET DAILY (Patient taking differently: Take 10 mg by mouth daily as needed.)  . losartan (COZAAR) 50 MG tablet Take 50 mg by mouth daily.  . RABEprazole (ACIPHEX) 20 MG tablet Take 1 tablet (20 mg total) by mouth daily.    Allergies: Pollen extract  Social History   Tobacco Use  . Smoking status: Never Smoker  . Smokeless tobacco: Never Used  Vaping  Use  . Vaping Use: Never used  Substance Use Topics  . Alcohol use: Yes    Comment: maybe once a year  . Drug use: Never    Family History  Problem Relation Age of Onset  . Early death Mother   . Kidney disease Mother        bright disease  . Other Father        tumor  on the side of his leg  . Heart disease Brother   . Heart attack Brother 93  . Leukemia Daughter   . Heart disease Brother 25  . Valvular heart disease Daughter        s/p mitral valve replacement    Review of Systems: A 12-system review of systems was performed and was negative except as noted in the HPI.  --------------------------------------------------------------------------------------------------  Physical Exam: BP (!) 160/70 (BP Location: Left Arm, Patient Position: Sitting, Cuff Size: Normal)   Pulse 64   Ht 5' 5.5" (1.664 m)   Wt 175 lb 4 oz (79.5 kg)   SpO2 97%   BMI 28.72 kg/m   General:  NAD. Neck: No JVD or HJR. Lungs: Clear to auscultation bilaterally without wheezes or crackles. Heart: Regular rate and rhythm without murmurs, rubs, or gallops. Abdomen: Soft, nontender, nondistended. Extremities: Trace pretibial edema.  Lab Results  Component Value Date   WBC 5.9 02/12/2020   HGB 15.5 (H) 02/12/2020   HCT 46.2 (H) 02/12/2020   MCV 88.0 02/12/2020   PLT 227.0 02/12/2020    Lab Results  Component Value Date   NA 142 04/01/2020   K 4.3 04/01/2020   CL 103 04/01/2020   CO2 21 04/01/2020   BUN 15 04/01/2020   CREATININE 1.07 (H) 04/01/2020   GLUCOSE 105 (H) 04/01/2020   ALT 27 02/12/2020    Lab Results  Component Value Date   CHOL 208 (H) 02/12/2020   HDL 54.60 02/12/2020   LDLCALC 124 (H) 08/13/2019   LDLDIRECT 116.0 02/12/2020   TRIG 215.0 (H) 02/12/2020   CHOLHDL 4 02/12/2020    --------------------------------------------------------------------------------------------------  ASSESSMENT AND PLAN: Coronary artery disease with stable angina: No chest pain reported, though chronic DOE persists.  Recent cardiac CTA showed diffuse RCA disease that was hemodynamically significant though not focal stenosis was seen.  We have agreed to continue with medical therapy.  I will add amlodipine 2.5 mg daily for antianginal therapy and BP control.   Borderline low resting HR precludes addition of a standing beta blocker at this time.  We have agreed to decrease aspirin from 325 mg daily to 81 mg daily.  Atorvastatin 20 mg daily will be added to prevent progression of disease.  Hypertension: BP remains suboptimally controlled.  I agreed with recent escalation of losartan by Dr. Einar Pheasant.  We will also add low-dose amlodipine, as outlined above.  Hyperlipidemia: Triglycerides and LDL mildly elevated on last check in 02/2020.  Given abnormal cardiac CTA with at least mild disease, we have agreed to add atorvastatin 20 mg daily for target LDL < 70.  We will plan to recheck a lipid panel and LFT's in ~3 months.  Follow-up: RTC as scheduled in early March.  Nelva Bush, MD 04/16/2020 11:51 AM

## 2020-04-18 ENCOUNTER — Encounter: Payer: Self-pay | Admitting: Internal Medicine

## 2020-05-12 ENCOUNTER — Encounter: Payer: Self-pay | Admitting: Internal Medicine

## 2020-05-12 ENCOUNTER — Ambulatory Visit (INDEPENDENT_AMBULATORY_CARE_PROVIDER_SITE_OTHER): Payer: Medicare Other | Admitting: Internal Medicine

## 2020-05-12 ENCOUNTER — Other Ambulatory Visit: Payer: Self-pay

## 2020-05-12 VITALS — BP 138/68 | HR 59 | Ht 65.0 in | Wt 178.0 lb

## 2020-05-12 DIAGNOSIS — I10 Essential (primary) hypertension: Secondary | ICD-10-CM

## 2020-05-12 DIAGNOSIS — E785 Hyperlipidemia, unspecified: Secondary | ICD-10-CM

## 2020-05-12 DIAGNOSIS — I25118 Atherosclerotic heart disease of native coronary artery with other forms of angina pectoris: Secondary | ICD-10-CM

## 2020-05-12 MED ORDER — AMLODIPINE BESYLATE 5 MG PO TABS: 5.0000 mg | ORAL_TABLET | Freq: Every day | ORAL | 1 refills | Status: DC

## 2020-05-12 NOTE — Patient Instructions (Signed)
Medication Instructions:  Your physician has recommended you make the following change in your medication:  1- INCREASE Amlodipine 5 mg by mouth once a day.  *If you need a refill on your cardiac medications before your next appointment, please call your pharmacy*  Follow-Up: At [facility], you and your health needs are our priority.  As part of our continuing mission to provide you with exceptional heart care, we have created designated Provider Care Teams.  These Care Teams include your primary Cardiologist (physician) and Advanced Practice Providers (APPs -  Physician Assistants and Nurse Practitioners) who all work together to provide you with the care you need, when you need it.  We recommend signing up for the patient portal called "MyChart".  Sign up information is provided on this After Visit Summary.  MyChart is used to connect with patients for Virtual Visits (Telemedicine).  Patients are able to view lab/test results, encounter notes, upcoming appointments, etc.  Non-urgent messages can be sent to your provider as well.   To learn more about what you can do with MyChart, go to [email or web address].    Your next appointment:   3 month(s)  The format for your next appointment:   In Person  Provider:   You may see Nelva Bush, MD or one of the following Advanced Practice Providers on your designated Care Team:    Murray Hodgkins, NP  Christell Faith, PA-C  Marrianne Mood, PA-C  Cadence [place], [place]  Laurann Montana, NP

## 2020-05-12 NOTE — Progress Notes (Signed)
Follow-up Outpatient Visit Date: 05/12/2020  Primary Care Provider: [name], [place] [address]  Chief Complaint: Follow-up hypertension and shortness of breath  HPI:  [name] is a 87 y.o. female with history of hypertension, hyperlipidemia, rheumatic fever, asthma, GERD, and thyroid dysfunction, who presents for follow-up of dyspnea on exertion and leg swelling. I last saw her a month ago, at which time Christine [name] reported feeling about the same as at prior visits. Amlodipine 2.5 mg daily was added for improved blood pressure control and antianginal therapy. Atorvastatin 20 mg daily was also started in the setting of moderate to severe CAD by cardiac CTA.  Today, Christine [name] reports that she is feeling notably better with more energy and fewer headaches.  She has stable exertional dyspnea.  She denies chest pain and palpitations as well as edema.  She notes occasional mild orthopnea.  Her home blood pressures have been somewhat labile but overall seem to be improving.  She has not had any low blood pressure readings.  She has not had any side effects from recently added amlodipine and atorvastatin.  --------------------------------------------------------------------------------------------------  Cardiovascular History & Procedures: Cardiovascular Problems:  Shortness of breath  Orthostatic hypotension  Risk Factors:  Hypertension, hyperlipidemia, and age greater than 64  Cath/PCI:  Nove available. Patient reports LHC at outside hospital >20 years ago with possible non-obstructive CAD.  CV Surgery:  None  EP Procedures and Devices:  None  Non-Invasive Evaluation(s):  Cardiac CTA (04/08/2020): LMCA with mild calcification and less than 25% stenosis.  LAD heavily calcified with up to 50% stenosis in the proximal and mid segments.  Nondominant LCx without significant disease.  Dominant RCA with less than 50% stenosis throughout  the vessel.  CT FFR notable for slow tapering throughout the RCA (0.6 distally).  TTE (03/09/2020): Normal LV size with borderline LVH. LVEF 60-65% with grade 1 diastolic dysfunction. Normal RV size and function. Normal biatrial size. Degenerative mitral valve disease with trivial regurgitation and no stenosis. Aortic sclerosis without stenosis.  Carotid Doppler (06/23/2019): Moderate right ICA plaque with 50-69% stenosis. Left carotid atherosclerosis without significant stenosis.   Recent CV Pertinent Labs: Lab Results  Component Value Date   CHOL 208 (H) 02/12/2020   HDL 54.60 02/12/2020   LDLCALC 124 (H) 08/13/2019   LDLDIRECT 116.0 02/12/2020   TRIG 215.0 (H) 02/12/2020   CHOLHDL 4 02/12/2020   K 4.3 04/01/2020   MG 2.0 12/12/2018   BUN 15 04/01/2020   CREATININE 1.07 (H) 04/01/2020    Past medical and surgical history were reviewed and updated in EPIC.  Current Meds  Medication Sig  . amLODipine (NORVASC) 5 MG tablet Take 1 tablet (5 mg total) by mouth daily.  [place] Kitchen aspirin EC 81 MG tablet Take 1 tablet (81 mg total) by mouth daily. Swallow whole.  [place] Kitchen atorvastatin (LIPITOR) 20 MG tablet Take 1 tablet (20 mg total) by mouth daily.  [place] Kitchen escitalopram (LEXAPRO) 10 MG tablet Take 1 tablet (10 mg total) by mouth daily.  . fenofibrate 160 MG tablet Take 1 tablet (160 mg total) by mouth daily.  . Fluticasone Propionate, Inhal, 250 MCG/BLIST AEPB Inhale 1 puff into the lungs in the morning and at bedtime.  [place] Kitchen levothyroxine (SYNTHROID) 75 MCG tablet Take 1 tablet (75 mcg total) by mouth daily.  [place] Kitchen loratadine (CLARITIN) 10 MG tablet TAKE 1 TABLET DAILY  . losartan (COZAAR) 50 MG tablet Take 50 mg by mouth daily.  . RABEprazole (ACIPHEX)  20 MG tablet Take 1 tablet (20 mg total) by mouth daily.  . [DISCONTINUED] amLODipine (NORVASC) 2.5 MG tablet Take 1 tablet (2.5 mg total) by mouth daily.    Allergies: Pollen extract  Social History   Tobacco Use  . Smoking status: Never Smoker  .  Smokeless tobacco: Never Used  Vaping Use  . Vaping Use: Never used  Substance Use Topics  . Alcohol use: Yes    Comment: maybe once a year  . Drug use: Never    Family History  Problem Relation Age of Onset  . Early death Mother   . Kidney disease Mother        bright disease  . Other Father        tumor on the side of his leg  . Heart disease Brother   . Heart attack Brother 108  . Leukemia Daughter   . Heart disease Brother 98  . Valvular heart disease Daughter        s/p mitral valve replacement    Review of Systems: A 12-system review of systems was performed and was negative except as noted in the HPI.  --------------------------------------------------------------------------------------------------  Physical Exam: BP 138/68 (BP Location: Left Arm, Patient Position: Sitting, Cuff Size: Normal)   Pulse (!) 59   Ht 5\' 5"  (1.651 m)   Wt 178 lb (80.7 kg)   SpO2 98%   BMI 29.62 kg/m   General:  NAD. Neck: No JVD or HJR. Lungs: Clear to auscultation bilaterally without wheezes or crackles. Heart: Regular rate and rhythm without murmurs, rubs, or gallops. Abdomen: Soft, nontender, nondistended. Extremities: No lower extremity edema.  EKG: Sinus bradycardia (heart rate 59 bpm).  Otherwise, no abnormality.  Lab Results  Component Value Date   WBC 5.9 02/12/2020   HGB 15.5 (H) 02/12/2020   HCT 46.2 (H) 02/12/2020   MCV 88.0 02/12/2020   PLT 227.0 02/12/2020    Lab Results  Component Value Date   NA 142 04/01/2020   K 4.3 04/01/2020   CL 103 04/01/2020   CO2 21 04/01/2020   BUN 15 04/01/2020   CREATININE 1.07 (H) 04/01/2020   GLUCOSE 105 (H) 04/01/2020   ALT 27 02/12/2020    Lab Results  Component Value Date   CHOL 208 (H) 02/12/2020   HDL 54.60 02/12/2020   LDLCALC 124 (H) 08/13/2019   LDLDIRECT 116.0 02/12/2020   TRIG 215.0 (H) 02/12/2020   CHOLHDL 4 02/12/2020     --------------------------------------------------------------------------------------------------  ASSESSMENT AND PLAN: Coronary artery disease with stable angina: Overall, Christine [name] feels better with recent addition of low-dose amlodipine.  Notably, her energy has gotten better.  Given no focal stenosis on recent cardiac CTA, we will continue with medical therapy; we have agreed to increase amlodipine to 5 mg daily.  Continue aspirin and atorvastatin for secondary prevention.  Hypertension: Blood pressure borderline elevated today but better than at prior visits.  We will increase amlodipine to 5 mg daily.  Continue current dose of losartan.  Hyperlipidemia: Christine [name] is tolerating atorvastatin well.  We will plan to continue this for target LDL less than 70.  Follow-up lipid panel and LFTs will be checked at follow-up in 3 months.  Follow-up: Return to clinic in 3 months.  Nelva Bush, MD 05/12/2020 12:25 PM

## 2020-06-02 ENCOUNTER — Other Ambulatory Visit: Payer: Self-pay

## 2020-06-02 ENCOUNTER — Ambulatory Visit (INDEPENDENT_AMBULATORY_CARE_PROVIDER_SITE_OTHER): Payer: Medicare Other | Admitting: Family Medicine

## 2020-06-02 ENCOUNTER — Encounter: Payer: Self-pay | Admitting: Family Medicine

## 2020-06-02 VITALS — BP 124/60 | HR 67 | Temp 97.0°F | Ht 65.0 in | Wt 176.0 lb

## 2020-06-02 DIAGNOSIS — F321 Major depressive disorder, single episode, moderate: Secondary | ICD-10-CM | POA: Diagnosis not present

## 2020-06-02 DIAGNOSIS — E039 Hypothyroidism, unspecified: Secondary | ICD-10-CM

## 2020-06-02 DIAGNOSIS — M170 Bilateral primary osteoarthritis of knee: Secondary | ICD-10-CM | POA: Diagnosis not present

## 2020-06-02 DIAGNOSIS — I10 Essential (primary) hypertension: Secondary | ICD-10-CM | POA: Diagnosis not present

## 2020-06-02 MED ORDER — LEVOTHYROXINE SODIUM 75 MCG PO TABS: 75.0000 ug | ORAL_TABLET | Freq: Every day | ORAL | 3 refills | Status: DC

## 2020-06-02 MED ORDER — ESCITALOPRAM OXALATE 10 MG PO TABS: 10.0000 mg | ORAL_TABLET | Freq: Every day | ORAL | 3 refills | Status: DC

## 2020-06-02 NOTE — Patient Instructions (Addendum)
Would also recommend calling OB/GYN office for your granddaughter  I believe [facility] - call there to see if they can be seen   [place] location:   [facility] [phone]-  -Counseling/therapy (14 years and up- [employer] only)   -Psychiatry services most insurances accepted-treat and evaluate/diagnose patients and prescribe medications.  -Medication Management 85 years old to 10 years.  -Diagnoses treated: Depression/anxiety, ADHD, Substance Abuse, Bipolar Disorder, Psychotherapy, Pain Management, Spiritual Care Services.  [facility] (61 years of age and older) [phone]- Psychology services/therapy only.  [facility] [phone]- All ages-Just Therapy services   [facility] [phone]- [facility]-counseling only.   [facility] [phone]- All ages-Psychology and Psychiatrist services (evaluate, treat, diagnose, prescribe medication) Treat all the diagnoses that would fall under Behavioral issues.   For new patients, on Monday, Wednesday and Friday from 8 am to 3 pm patient would just walk in and fill out paperwork and they would get patient enrolled in the services they need based on their answers.   [facility] [employer] [facility] [phone]- All ages. Monday through Friday-9am to 4 pm walk in times for patients to come in and be evaluated. Medication Management only at this time. No therapy available.   [place] location:   [facility] Address: [address] [place] [address] Phone: [phone] Counseling and psychiatry services.   [facility] and [place] locations)- [phone]- all ages, only therapy/counseling services/psychological evaluations. Services: aptitude testing, academic achievement testing, learning Disability evaluation, ADHD evaluations, psycho-educational evaluations, readiness for  kindergarten Evaluations.   [place] location only has therapy services right now  [facility] [phone] services only. Works off [address] in [place].

## 2020-06-02 NOTE — Assessment & Plan Note (Signed)
Improved. Still with occasional symptoms but is managing. Major stressor is granddaughter's health - resources provided today. Cont lexapro 10 mg.

## 2020-06-02 NOTE — Progress Notes (Signed)
Subjective:     Christine [name] is a 87 y.o. female presenting for Follow-up (2 month- A & D )     HPI  #anxiety/depression - granddaughter moved in with ptsd/anxiety and needed to be enrolled in school and that was challenging  - note she still has some symptoms but this is not impacting her life  #knee pain - twisted her knee - hx of chronic pain - had gel injections in the past - using icy/hot patch  Review of Systems  04/13/20: Clinic - HTN - Increase losartan 50 mg. Depression - increase lexapro 10 mg.   Social History   Tobacco Use  Smoking Status Never Smoker  Smokeless Tobacco Never Used        Objective:    BP Readings from Last 3 Encounters:  06/02/20 124/60  05/12/20 138/68  04/16/20 (!) 160/70   Wt Readings from Last 3 Encounters:  06/02/20 176 lb (79.8 kg)  05/12/20 178 lb (80.7 kg)  04/16/20 175 lb 4 oz (79.5 kg)    BP 124/60   Pulse 67   Temp (!) 97 F (36.1 C) (Temporal)   Ht 5\' 5"  (1.651 m)   Wt 176 lb (79.8 kg)   SpO2 95%   BMI 29.29 kg/m    Physical Exam Constitutional:      General: She is not in acute distress.    Appearance: She is well-developed. She is not diaphoretic.  HENT:     Right Ear: External ear normal.     Left Ear: External ear normal.  Eyes:     Conjunctiva/sclera: Conjunctivae normal.  Cardiovascular:     Rate and Rhythm: Normal rate and regular rhythm.  Pulmonary:     Effort: Pulmonary effort is normal. No respiratory distress.     Breath sounds: Normal breath sounds. No wheezing.  Musculoskeletal:     Cervical back: Neck supple.  Skin:    General: Skin is warm and dry.     Capillary Refill: Capillary refill takes less than 2 seconds.  Neurological:     Mental Status: She is alert. Mental status is at baseline.  Psychiatric:        Mood and Affect: Mood normal.        Behavior: Behavior normal.           Assessment & Plan:   Problem List Items Addressed This Visit      Cardiovascular and  Mediastinum   Essential hypertension    BP improved. Cont losartan 50 mg and amlodipine 5 mg        Endocrine   Acquired hypothyroidism    Doing well. TSH in range. Cont levo 75 mcg      Relevant Medications   levothyroxine (SYNTHROID) 75 MCG tablet     Musculoskeletal and Integument   Osteoarthritis of knees, bilateral - Primary    Old injury. Doing well. Recent worsening but already improving. Cont icy/hot        Other   Current moderate episode of major depressive disorder without prior episode ([place])    Improved. Still with occasional symptoms but is managing. Major stressor is granddaughter's health - resources provided today. Cont lexapro 10 mg.       Relevant Medications   escitalopram (LEXAPRO) 10 MG tablet       Return in about 6 months (around 12/03/2020) for annual wellness .  [name], MD  This visit occurred during the SARS-CoV-2 public health emergency.  Safety protocols were in  place, including screening questions prior to the visit, additional usage of staff PPE, and extensive cleaning of exam room while observing appropriate contact time as indicated for disinfecting solutions.

## 2020-06-02 NOTE — Assessment & Plan Note (Signed)
Old injury. Doing well. Recent worsening but already improving. Cont icy/hot

## 2020-06-02 NOTE — Assessment & Plan Note (Signed)
BP improved. Cont losartan 50 mg and amlodipine 5 mg

## 2020-06-02 NOTE — Assessment & Plan Note (Signed)
Doing well. TSH in range. Cont levo 75 mcg

## 2020-06-10 ENCOUNTER — Ambulatory Visit: Payer: Medicare Other | Admitting: Family Medicine

## 2020-08-06 ENCOUNTER — Other Ambulatory Visit: Payer: Self-pay | Admitting: Family Medicine

## 2020-08-06 DIAGNOSIS — E785 Hyperlipidemia, unspecified: Secondary | ICD-10-CM

## 2020-08-19 ENCOUNTER — Other Ambulatory Visit: Payer: Self-pay | Admitting: Family Medicine

## 2020-08-30 DIAGNOSIS — Z20822 Contact with and (suspected) exposure to covid-19: Secondary | ICD-10-CM | POA: Diagnosis not present

## 2020-08-31 ENCOUNTER — Telehealth: Payer: Self-pay | Admitting: Family Medicine

## 2020-08-31 NOTE — Telephone Encounter (Signed)
Can add on Virtual today at 4:20 or tomorrow at 12 pm

## 2020-08-31 NOTE — Telephone Encounter (Signed)
Mrs. Tamela Oddi called in due to she was exposed to covid by her granddaughter boyfriend and she took a test and she tested positive for covid and no symptoms and wanted to know if its something that can be called in for her.

## 2020-08-31 NOTE — Telephone Encounter (Signed)
Christine [name] is calling patient now and she is adding her to Dr. Verda Cumins schedule at 12pm tomorrow for evaluation.

## 2020-09-01 ENCOUNTER — Other Ambulatory Visit: Payer: Self-pay

## 2020-09-01 ENCOUNTER — Telehealth (INDEPENDENT_AMBULATORY_CARE_PROVIDER_SITE_OTHER): Payer: Medicare Other | Admitting: Family Medicine

## 2020-09-01 ENCOUNTER — Encounter: Payer: Self-pay | Admitting: Family Medicine

## 2020-09-01 VITALS — Ht 65.0 in | Wt 168.0 lb

## 2020-09-01 DIAGNOSIS — U071 COVID-19: Secondary | ICD-10-CM | POA: Diagnosis not present

## 2020-09-01 MED ORDER — NIRMATRELVIR/RITONAVIR (PAXLOVID) TABLET (RENAL DOSING): 2.0000 | ORAL_TABLET | Freq: Two times a day (BID) | ORAL | 0 refills | Status: AC

## 2020-09-01 NOTE — Progress Notes (Signed)
Virtual Visit via Telephone Note  I connected with Christine [name] on 09/01/20 at 12:00 PM EDT by telephone and verified that I am speaking with the correct person using two identifiers.   I discussed the limitations, risks, security and privacy concerns of performing an evaluation and management service by telephone and the availability of in person appointments. I also discussed with the patient that there may be a patient responsible charge related to this service. The patient expressed understanding and agreed to proceed.  Patient location: Home Provider Location: [place] Participants: [name] and Christine [name]   History of Present Illness: Chief Complaint  Patient presents with   Covid Positive    Pt had a positive COVID test on Monday 6/20 done at [facility]. Pt has occasional cough and SOB due to Asthma. Having some sinus issues but nothing worse. Denies fever.    HPI  #Covid  - positive covid test on 6/20 - was visiting with granddaughter and brought boyfriend - 6/14 - was nauseous on 6/14 - tired and didn't feel well on 6/15 - has been feeling well and fine since then - boyfriend tested positive for covid and was notified on 6/19 - made an appointment for covid test on 6/20 which was positive - no symptoms since then    ROS    Observations/Objective: Ht 5\' 5"  (1.651 m)   Wt 168 lb (76.2 kg)   BMI 27.96 kg/m   Phone visit:  Patient speaking in complete sentences No distress Alert and oriented Normal mood  Assessment and Plan: Problem List Items Addressed This Visit   None Visit Diagnoses     COVID-19 virus infection    -  Primary   Relevant Medications   nirmatrelvir/ritonavir EUA, renal dosing, (PAXLOVID) TABS      Covid positive - no symptoms currently High risk due to HTN, Age, Asthma Renal dose paxlovid Hold statin   Follow Up Instructions:  Return if symptoms worsen or fail to improve.   I discussed the assessment and  treatment plan with the patient. The patient was provided an opportunity to ask questions and all were answered. The patient agreed with the plan and demonstrated an understanding of the instructions.   The patient was advised to call back or seek an in-person evaluation if the symptoms worsen or if the condition fails to improve as anticipated.  Pt did not have access to video capabilities  I provided 14 minutes of non-face-to-face time during this encounter.   [name], MD

## 2020-09-06 NOTE — Progress Notes (Signed)
Follow-up Outpatient Visit Date: 09/08/2020  Primary Care Provider: Lesleigh [name], [place] [address]  Chief Complaint: Follow-up coronary artery disease and hypertension  HPI:  Christine [name] is a 87 y.o. female with history of hypertension, hyperlipidemia, rheumatic fever, asthma, GERD, and thyroid dysfunction, who presents for follow-up of coronary artery disease, hypertension, and shortness of breath.  I last saw her in March, at which time Christine [name] was feeling notably better than at our prior visit with more energy and fewer headaches.  Chronic exertional dyspnea was stable.  We agreed to increase amlodipine for improved blood pressure control and antianginal therapy.  Today, Christine [name] reports that she has been feeling relatively well.  She occasionally notes pain in her upper back between her shoulder blades, which can occur at random times.  She sometimes notes it when she is sitting down, though it also can occur when she is moving about such as going up steps.  She is not had any frank chest pain nor palpitations, lightheadedness, or edema.  Her breathing has overall has been fairly good though she notes intermittent transient shortness of breath that improves with her inhaler.  She is walking about 3 miles a day without difficulty.  Home blood pressures have been relatively normal or even borderline low.  She is tolerating her medications without side effects.  --------------------------------------------------------------------------------------------------  Cardiovascular History & Procedures: Cardiovascular Problems: Shortness of breath Orthostatic hypotension Coronary artery disease   Risk Factors: Hypertension, hyperlipidemia, and age greater than 73   Cath/PCI: Nove available.  Patient reports LHC at outside hospital >20 years ago with possible non-obstructive CAD.   CV Surgery: None   EP Procedures and Devices: None    Non-Invasive Evaluation(s): Cardiac CTA (04/08/2020): LMCA with mild calcification and less than 25% stenosis.  LAD heavily calcified with up to 50% stenosis in the proximal and mid segments.  Nondominant LCx without significant disease.  Dominant RCA with less than 50% stenosis throughout the vessel.  CT FFR notable for slow tapering throughout the RCA (0.6 distally). TTE (03/09/2020): Normal LV size with borderline LVH.  LVEF 60-65% with grade 1 diastolic dysfunction.  Normal RV size and function.  Normal biatrial size.  Degenerative mitral valve disease with trivial regurgitation and no stenosis.  Aortic sclerosis without stenosis. Carotid Doppler (06/23/2019): Moderate right ICA plaque with 50-69% stenosis.  Left carotid atherosclerosis without significant stenosis.  Recent CV Pertinent Labs: Lab Results  Component Value Date   CHOL 208 (H) 02/12/2020   HDL 54.60 02/12/2020   LDLCALC 124 (H) 08/13/2019   LDLDIRECT 116.0 02/12/2020   TRIG 215.0 (H) 02/12/2020   CHOLHDL 4 02/12/2020   K 4.3 04/01/2020   MG 2.0 12/12/2018   BUN 15 04/01/2020   CREATININE 1.07 (H) 04/01/2020    Past medical and surgical history were reviewed and updated in EPIC.  Current Meds  Medication Sig   amLODipine (NORVASC) 5 MG tablet Take 1 tablet (5 mg total) by mouth daily.   aspirin EC 81 MG tablet Take 1 tablet (81 mg total) by mouth daily. Swallow whole.   atorvastatin (LIPITOR) 20 MG tablet Take 1 tablet (20 mg total) by mouth daily.   escitalopram (LEXAPRO) 10 MG tablet Take 1 tablet (10 mg total) by mouth daily.   fenofibrate 160 MG tablet TAKE 1 TABLET DAILY   Fluticasone Propionate, Inhal, 250 MCG/BLIST AEPB Inhale 1 puff into the lungs in the morning and at bedtime.   levothyroxine (  SYNTHROID) 75 MCG tablet Take 1 tablet (75 mcg total) by mouth daily.   loratadine (CLARITIN) 10 MG tablet Take 10 mg by mouth daily as needed for allergies.   losartan (COZAAR) 50 MG tablet TAKE 1 TABLET DAILY    RABEprazole (ACIPHEX) 20 MG tablet Take 1 tablet (20 mg total) by mouth daily.    Allergies: Pollen extract  Social History   Tobacco Use   Smoking status: Never   Smokeless tobacco: Never  Vaping Use   Vaping Use: Never used  Substance Use Topics   Alcohol use: Yes    Comment: maybe once a year   Drug use: Never    Family History  Problem Relation Age of Onset   Early death Mother    Kidney disease Mother        bright disease   Other Father        tumor on the side of his leg   Heart disease Brother    Heart attack Brother 23   Leukemia Daughter    Heart disease Brother 60   Valvular heart disease Daughter        s/p mitral valve replacement    Review of Systems: A 12-system review of systems was performed and was negative except as noted in the HPI.  --------------------------------------------------------------------------------------------------  Physical Exam: BP 110/60 (BP Location: Left Arm, Patient Position: Sitting, Cuff Size: Normal)   Pulse 80   Ht 5\' 5"  (1.651 m)   Wt 165 lb (74.8 kg)   SpO2 99%   BMI 27.46 kg/m   General:  NAD. Neck: No JVD or HJR. Lungs: Clear to auscultation bilaterally without wheezes or crackles. Heart: Regular rate and rhythm without murmurs, rubs, or gallops. Abdomen: Soft, nontender, nondistended. Extremities: No lower extremity edema.  Lab Results  Component Value Date   WBC 5.9 02/12/2020   HGB 15.5 (H) 02/12/2020   HCT 46.2 (H) 02/12/2020   MCV 88.0 02/12/2020   PLT 227.0 02/12/2020    Lab Results  Component Value Date   NA 142 04/01/2020   K 4.3 04/01/2020   CL 103 04/01/2020   CO2 21 04/01/2020   BUN 15 04/01/2020   CREATININE 1.07 (H) 04/01/2020   GLUCOSE 105 (H) 04/01/2020   ALT 27 02/12/2020    Lab Results  Component Value Date   CHOL 208 (H) 02/12/2020   HDL 54.60 02/12/2020   LDLCALC 124 (H) 08/13/2019   LDLDIRECT 116.0 02/12/2020   TRIG 215.0 (H) 02/12/2020   CHOLHDL 4 02/12/2020     --------------------------------------------------------------------------------------------------  ASSESSMENT AND PLAN: Coronary artery disease with stable angina: Christine [name] notes occasional upper back pain that has not consistently present with activity and can also occur randomly at rest.  She has not had any frank chest pain.  Given lack of focal stenosis on coronary CTA earlier this year, we will plan to continue with medical therapy including aspirin, atorvastatin, and amlodipine.  Hypertension: Blood pressure low normal today.  Home readings have been normal as well.  No medication changes at this time.  Hyperlipidemia: Most recent lipid panel in December showed elevated LDL and triglycerides, prompting addition of atorvastatin.  Christine [name] has been tolerating this well.  We will repeat a lipid panel and ALT today to assess response for target LDL and triglycerides less than 70 and 150, respectively.  Follow-up: Return to clinic in 6 months.  Nelva Bush, MD 09/08/2020 10:36 AM

## 2020-09-08 ENCOUNTER — Encounter: Payer: Self-pay | Admitting: Internal Medicine

## 2020-09-08 ENCOUNTER — Ambulatory Visit (INDEPENDENT_AMBULATORY_CARE_PROVIDER_SITE_OTHER): Payer: Medicare Other | Admitting: Internal Medicine

## 2020-09-08 ENCOUNTER — Other Ambulatory Visit: Payer: Self-pay

## 2020-09-08 VITALS — BP 110/60 | HR 80 | Ht 65.0 in | Wt 165.0 lb

## 2020-09-08 DIAGNOSIS — E785 Hyperlipidemia, unspecified: Secondary | ICD-10-CM | POA: Diagnosis not present

## 2020-09-08 DIAGNOSIS — I10 Essential (primary) hypertension: Secondary | ICD-10-CM

## 2020-09-08 DIAGNOSIS — Z79899 Other long term (current) drug therapy: Secondary | ICD-10-CM

## 2020-09-08 DIAGNOSIS — I25118 Atherosclerotic heart disease of native coronary artery with other forms of angina pectoris: Secondary | ICD-10-CM | POA: Diagnosis not present

## 2020-09-08 NOTE — Patient Instructions (Signed)
Medication Instructions:   Your physician recommends that you continue on your current medications as directed. Please refer to the Current Medication list given to you today.  *If you need a refill on your cardiac medications before your next appointment, please call your pharmacy*   Lab Work:  Today: Lipid panel, ALT  If you have labs (blood work) drawn today and your tests are completely normal, you will receive your results only by: [place] (if you have MyChart) OR A paper copy in the mail If you have any lab test that is abnormal or we need to change your treatment, we will call you to review the results.   Testing/Procedures:  None ordered   Follow-Up: At [facility], you and your health needs are our priority.  As part of our continuing mission to provide you with exceptional heart care, we have created designated Provider Care Teams.  These Care Teams include your primary Cardiologist (physician) and Advanced Practice Providers (APPs -  Physician Assistants and Nurse Practitioners) who all work together to provide you with the care you need, when you need it.  We recommend signing up for the patient portal called "MyChart".  Sign up information is provided on this After Visit Summary.  MyChart is used to connect with patients for Virtual Visits (Telemedicine).  Patients are able to view lab/test results, encounter notes, upcoming appointments, etc.  Non-urgent messages can be sent to your provider as well.   To learn more about what you can do with MyChart, go to [email or web address].    Your next appointment:   6 month(s)  The format for your next appointment:   In Person  Provider:   You may see Nelva Bush, MD or one of the following Advanced Practice Providers on your designated Care Team:   Murray Hodgkins, NP Christell Faith, PA-C Marrianne Mood, PA-C Cadence [place], [place] Laurann Montana, NP

## 2020-09-09 ENCOUNTER — Encounter: Payer: Self-pay | Admitting: Internal Medicine

## 2020-09-09 LAB — LIPID PANEL
Chol/HDL Ratio: 3.2 ratio (ref 0.0–4.4)
Cholesterol, Total: 182 mg/dL (ref 100–199)
HDL: 57 mg/dL (ref >39)
LDL Chol Calc (NIH): 93 mg/dL (ref 0–99)
Triglycerides: 187 mg/dL — ABNORMAL HIGH (ref 0–149)
VLDL Cholesterol Cal: 32 mg/dL (ref 5–40)

## 2020-09-09 LAB — ALT: ALT: 41 IU/L — ABNORMAL HIGH (ref 0–32)

## 2020-09-20 ENCOUNTER — Telehealth: Payer: Self-pay | Admitting: *Deleted

## 2020-09-20 DIAGNOSIS — Z79899 Other long term (current) drug therapy: Secondary | ICD-10-CM

## 2020-09-20 DIAGNOSIS — E785 Hyperlipidemia, unspecified: Secondary | ICD-10-CM

## 2020-09-20 MED ORDER — ROSUVASTATIN CALCIUM 20 MG PO TABS: 20.0000 mg | ORAL_TABLET | Freq: Every day | ORAL | 3 refills | Status: DC

## 2020-09-20 NOTE — Telephone Encounter (Signed)
-----   Message from Nelva Bush, MD sent at 09/10/2020 12:52 PM EDT ----- Please let Ms. Loudenslager know that her cholesterol remains mildly elevated.  Her ALT is also mildly elevated.  In light of these findings, I suggest that we stop atorvastatin and have her start taking rosuvastatin 20 mg daily after a 1 week statin holiday.  We should repeat a lipid panel and ALT in 6 weeks after switching to rosuvastatin.

## 2020-09-20 NOTE — Telephone Encounter (Signed)
Spoke with pt's daughter, Jenny Reichmann (DPR approved) and notified of lab results and provider's recc.  Cindy voiced understanding, she will have pt:   -  STOP Atorvastatin  -  START Rosuvastatin 20mg  daily AFTER 1 week "statin holiday" -  Repeat Lipid Panel and ALT 6 weeks after starting Rosuvastatin at the [facility] aware to have pt fasting for lab work. Rx sent to mail order per pt request. Lab orders placed.  Jenny Reichmann has no further questions at this time.

## 2020-10-25 ENCOUNTER — Other Ambulatory Visit: Payer: Self-pay | Admitting: Family Medicine

## 2020-10-25 DIAGNOSIS — K219 Gastro-esophageal reflux disease without esophagitis: Secondary | ICD-10-CM

## 2020-10-27 DIAGNOSIS — M25561 Pain in right knee: Secondary | ICD-10-CM | POA: Diagnosis not present

## 2020-10-27 DIAGNOSIS — M25562 Pain in left knee: Secondary | ICD-10-CM | POA: Diagnosis not present

## 2020-10-27 DIAGNOSIS — G8929 Other chronic pain: Secondary | ICD-10-CM | POA: Diagnosis not present

## 2020-10-27 DIAGNOSIS — M25462 Effusion, left knee: Secondary | ICD-10-CM | POA: Diagnosis not present

## 2020-10-27 DIAGNOSIS — M1712 Unilateral primary osteoarthritis, left knee: Secondary | ICD-10-CM | POA: Diagnosis not present

## 2020-10-27 DIAGNOSIS — M1711 Unilateral primary osteoarthritis, right knee: Secondary | ICD-10-CM | POA: Diagnosis not present

## 2020-11-09 DIAGNOSIS — M25461 Effusion, right knee: Secondary | ICD-10-CM | POA: Diagnosis not present

## 2020-11-09 DIAGNOSIS — M25562 Pain in left knee: Secondary | ICD-10-CM | POA: Diagnosis not present

## 2020-11-09 DIAGNOSIS — M25462 Effusion, left knee: Secondary | ICD-10-CM | POA: Diagnosis not present

## 2020-11-09 DIAGNOSIS — G8929 Other chronic pain: Secondary | ICD-10-CM | POA: Diagnosis not present

## 2020-11-09 DIAGNOSIS — M25561 Pain in right knee: Secondary | ICD-10-CM | POA: Diagnosis not present

## 2020-11-09 DIAGNOSIS — M170 Bilateral primary osteoarthritis of knee: Secondary | ICD-10-CM | POA: Diagnosis not present

## 2020-11-16 DIAGNOSIS — M25462 Effusion, left knee: Secondary | ICD-10-CM | POA: Diagnosis not present

## 2020-11-16 DIAGNOSIS — M25562 Pain in left knee: Secondary | ICD-10-CM | POA: Diagnosis not present

## 2020-11-16 DIAGNOSIS — M25561 Pain in right knee: Secondary | ICD-10-CM | POA: Diagnosis not present

## 2020-11-16 DIAGNOSIS — M170 Bilateral primary osteoarthritis of knee: Secondary | ICD-10-CM | POA: Diagnosis not present

## 2020-11-16 DIAGNOSIS — G8929 Other chronic pain: Secondary | ICD-10-CM | POA: Diagnosis not present

## 2020-12-01 ENCOUNTER — Other Ambulatory Visit: Payer: Self-pay

## 2020-12-01 ENCOUNTER — Encounter: Payer: Self-pay | Admitting: Family Medicine

## 2020-12-01 ENCOUNTER — Ambulatory Visit (INDEPENDENT_AMBULATORY_CARE_PROVIDER_SITE_OTHER): Payer: Medicare Other | Admitting: Family Medicine

## 2020-12-01 VITALS — BP 126/68 | HR 58 | Temp 97.7°F | Ht 65.0 in | Wt 169.0 lb

## 2020-12-01 DIAGNOSIS — E785 Hyperlipidemia, unspecified: Secondary | ICD-10-CM | POA: Diagnosis not present

## 2020-12-01 DIAGNOSIS — D751 Secondary polycythemia: Secondary | ICD-10-CM

## 2020-12-01 DIAGNOSIS — E039 Hypothyroidism, unspecified: Secondary | ICD-10-CM

## 2020-12-01 DIAGNOSIS — Z0000 Encounter for general adult medical examination without abnormal findings: Secondary | ICD-10-CM | POA: Diagnosis not present

## 2020-12-01 DIAGNOSIS — I10 Essential (primary) hypertension: Secondary | ICD-10-CM | POA: Diagnosis not present

## 2020-12-01 DIAGNOSIS — Z23 Encounter for immunization: Secondary | ICD-10-CM

## 2020-12-01 LAB — COMPREHENSIVE METABOLIC PANEL
ALT: 20 U/L (ref 0–35)
AST: 25 U/L (ref 0–37)
Albumin: 4.5 g/dL (ref 3.5–5.2)
Alkaline Phosphatase: 76 U/L (ref 39–117)
BUN: 18 mg/dL (ref 6–23)
CO2: 27 mEq/L (ref 19–32)
Calcium: 10.1 mg/dL (ref 8.4–10.5)
Chloride: 100 mEq/L (ref 96–112)
Creatinine, Ser: 0.9 mg/dL (ref 0.40–1.20)
GFR: 58.29 mL/min — ABNORMAL LOW (ref >60.00)
Glucose, Bld: 99 mg/dL (ref 70–99)
Potassium: 4.6 mEq/L (ref 3.5–5.1)
Sodium: 138 mEq/L (ref 135–145)
Total Bilirubin: 0.8 mg/dL (ref 0.2–1.2)
Total Protein: 7.3 g/dL (ref 6.0–8.3)

## 2020-12-01 LAB — LIPID PANEL
Cholesterol: 170 mg/dL (ref 0–200)
HDL: 65.3 mg/dL (ref >39.00)
LDL Cholesterol: 72 mg/dL (ref 0–99)
NonHDL: 105
Total CHOL/HDL Ratio: 3
Triglycerides: 165 mg/dL — ABNORMAL HIGH (ref 0.0–149.0)
VLDL: 33 mg/dL (ref 0.0–40.0)

## 2020-12-01 LAB — CBC WITH DIFFERENTIAL/PLATELET
Basophils Absolute: 0.1 10*3/uL (ref 0.0–0.1)
Basophils Relative: 0.9 % (ref 0.0–3.0)
Eosinophils Absolute: 0.3 10*3/uL (ref 0.0–0.7)
Eosinophils Relative: 4.1 % (ref 0.0–5.0)
HCT: 45.4 % (ref 36.0–46.0)
Hemoglobin: 15.2 g/dL — ABNORMAL HIGH (ref 12.0–15.0)
Lymphocytes Relative: 28.5 % (ref 12.0–46.0)
Lymphs Abs: 1.8 10*3/uL (ref 0.7–4.0)
MCHC: 33.4 g/dL (ref 30.0–36.0)
MCV: 89.9 fl (ref 78.0–100.0)
Monocytes Absolute: 0.5 10*3/uL (ref 0.1–1.0)
Monocytes Relative: 8.4 % (ref 3.0–12.0)
Neutro Abs: 3.7 10*3/uL (ref 1.4–7.7)
Neutrophils Relative %: 58.1 % (ref 43.0–77.0)
Platelets: 180 10*3/uL (ref 150.0–400.0)
RBC: 5.05 Mil/uL (ref 3.87–5.11)
RDW: 13.3 % (ref 11.5–15.5)
WBC: 6.3 10*3/uL (ref 4.0–10.5)

## 2020-12-01 LAB — TSH: TSH: 0.68 u[IU]/mL (ref 0.35–5.50)

## 2020-12-01 NOTE — Progress Notes (Signed)
Subjective:   Christine [name] is a 87 y.o. female who presents for Medicare Annual (Subsequent) preventive examination.  Review of Systems    Review of Systems  Constitutional:  Negative for chills and fever.  HENT:  Negative for congestion and sore throat.   Eyes:  Negative for blurred vision and double vision.  Respiratory:  Negative for shortness of breath.   Cardiovascular:  Negative for chest pain.  Gastrointestinal:  Negative for heartburn, nausea and vomiting.  Genitourinary: Negative.   Musculoskeletal: Negative.  Negative for myalgias.  Skin:  Negative for rash.  Neurological:  Negative for dizziness and headaches.  Endo/Heme/Allergies:  Does not bruise/bleed easily.  Psychiatric/Behavioral:  Negative for depression. The patient is not nervous/anxious.    Cardiac Risk Factors include: advanced age (>56men, >83 women);dyslipidemia;hypertension     Objective:    Today's Vitals   12/01/20 1026  BP: 126/68  Pulse: (!) 58  Temp: 97.7 F (36.5 C)  TempSrc: Temporal  SpO2: 95%  Weight: 169 lb (76.7 kg)  Height: 5\' 5"  (1.651 m)   Body mass index is 28.12 kg/m.  Advanced Directives 12/01/2020  Does Patient Have a Medical Advance Directive? Yes  Type of Advance Directive Living will  Does patient want to make changes to medical advance directive? No - Patient declined    Current Medications (verified) Outpatient Encounter Medications as of 12/01/2020  Medication Sig   amLODipine (NORVASC) 5 MG tablet Take 1 tablet (5 mg total) by mouth daily.   aspirin EC 81 MG tablet Take 1 tablet (81 mg total) by mouth daily. Swallow whole.   escitalopram (LEXAPRO) 10 MG tablet Take 1 tablet (10 mg total) by mouth daily.   Fluticasone Propionate, Inhal, 250 MCG/BLIST AEPB Inhale 1 puff into the lungs in the morning and at bedtime.   levothyroxine (SYNTHROID) 75 MCG tablet Take 1 tablet (75 mcg total) by mouth daily.   loratadine (CLARITIN) 10 MG tablet Take 10 mg by mouth daily  as needed for allergies.   losartan (COZAAR) 50 MG tablet TAKE 1 TABLET DAILY   RABEprazole (ACIPHEX) 20 MG tablet TAKE 1 TABLET DAILY   rosuvastatin (CRESTOR) 20 MG tablet Take 1 tablet (20 mg total) by mouth daily.   [DISCONTINUED] fenofibrate 160 MG tablet TAKE 1 TABLET DAILY   No facility-administered encounter medications on file as of 12/01/2020.    Allergies (verified) Pollen extract   History: Past Medical History:  Diagnosis Date   Asthma    Chicken pox    Frequent headaches    GERD (gastroesophageal reflux disease)    Heart murmur    Hyperlipidemia    Hypertension    Rheumatic fever    Thyroid disorder    Urine incontinence    Past Surgical History:  Procedure Laterality Date   ABDOMINAL HYSTERECTOMY     APPENDECTOMY     BREAST BIOPSY     MASTECTOMY Bilateral 04/1979   Family History  Problem Relation Age of Onset   Early death Mother    Kidney disease Mother        bright disease   Other Father        tumor on the side of his leg   Heart disease Brother    Heart attack Brother 31   Leukemia Daughter    Heart disease Brother 13   Valvular heart disease Daughter        s/p mitral valve replacement   Social History   Socioeconomic History   Marital status:  Widowed    Spouse name: Not on file   Number of children: 4   Years of education: college   Highest education level: Not on file  Occupational History   Not on file  Tobacco Use   Smoking status: Never   Smokeless tobacco: Never  Vaping Use   Vaping Use: Never used  Substance and Sexual Activity   Alcohol use: Yes    Comment: maybe once a year   Drug use: Never   Sexual activity: Not Currently  Other Topics Concern   Not on file  Social History Narrative   06/03/19   From: [place] originally, moved here to be near daughter   Living: with daughter - Christine [name] Scientist, clinical (histocompatibility and immunogenetics))   Work: retired from Higher education careers adviser, Education officer, museum      Family: 4 children - Christine [name] and Christine [name] in [place], daughter in  [name] and has adopted daughter in [place]       Enjoys: play tennis, golf, walking, board games, gardening      Exercise: not currently - walking around the house   Diet: not as good as it should be, usually tries to be healthy, avoids fried foods, limits red meat      Safety   Seat belts: Yes    Guns: no   Safe in relationships: Yes    Social Determinants of Radio broadcast assistant Strain: Not on file  Food Insecurity: Not on file  Transportation Needs: Not on file  Physical Activity: Not on file  Stress: Not on file  Social Connections: Not on file    Tobacco Counseling Counseling given: No   Clinical Intake:  Pre-visit preparation completed: No  Pain : No/denies pain     BMI - recorded: 28.12 Nutritional Status: BMI 25 -29 Overweight Nutritional Risks: None Diabetes: No  How often do you need to have someone help you when you read instructions, pamphlets, or other written materials from your doctor or pharmacy?: 1 - Never What is the last grade level you completed in school?: college  Diabetic?no  Interpreter Needed?: No      Activities of Daily Living In your present state of health, do you have any difficulty performing the following activities: 12/01/2020 12/01/2020  Hearing? N N  Vision? N N  Difficulty concentrating or making decisions? N N  Walking or climbing stairs? N N  Dressing or bathing? N N  Doing errands, shopping? N N  Preparing Food and eating ? N -  Using the Toilet? N -  In the past six months, have you accidently leaked urine? Y -  Comment sneeze and laughing -  Do you have problems with loss of bowel control? N -  Managing your Medications? N -  Managing your Finances? N -  Housekeeping or managing your Housekeeping? N -  Some recent data might be hidden    Patient Care Team: [name], MD as PCP - General (Family Medicine) End, Harrell Gave, MD as PCP - Cardiology (Cardiology)  Indicate any recent Medical  Services you may have received from other than [facility] providers in the past year (date may be approximate).     Assessment:   This is a routine wellness examination for Christine [name].  Hearing/Vision screen No results found.  Dietary issues and exercise activities discussed: Current Exercise Habits: Home exercise routine, Type of exercise: walking, Time (Minutes): 60, Frequency (Times/Week): 6, Weekly Exercise (Minutes/Week): 360, Intensity: Moderate, Exercise limited by: orthopedic condition(s)   Goals Addressed  This Visit's Progress    Weight (lb) < 160 lb (72.6 kg)   169 lb (76.7 kg)     Depression Screen PHQ 2/9 Scores 12/01/2020 06/02/2020 04/13/2020 02/12/2020 12/12/2018  PHQ - 2 Score 0 2 2 6  0  PHQ- 9 Score 1 10 11 20  -    Fall Risk Fall Risk  12/01/2020 02/12/2020 12/12/2018  Falls in the past year? 1 1 1   Number falls in past yr: 0 1 0  Injury with Fall? 0 0 -  Risk for fall due to : Impaired balance/gait;Orthopedic patient History of fall(s) -  Follow up Falls evaluation completed - Falls evaluation completed    FALL RISK PREVENTION PERTAINING TO THE HOME:  Any stairs in or around the home? Yes  If so, are there any without handrails? Yes  Home free of loose throw rugs in walkways, pet beds, electrical cords, etc? Yes  Adequate lighting in your home to reduce risk of falls? Yes   ASSISTIVE DEVICES UTILIZED TO PREVENT FALLS:  Life alert? No  Use of a cane, walker or w/c? No  Grab bars in the bathroom? No  Shower chair or bench in shower? Yes  Elevated toilet seat or a handicapped toilet? No     Cognitive Function:   Montreal Cognitive Assessment  03/02/2020  Visuospatial/ Executive (0/5) 3  Naming (0/3) 3  Attention: Read list of digits (0/2) 2  Attention: Read list of letters (0/1) 1  Attention: Serial 7 subtraction starting at 100 (0/3) 3  Language: Repeat phrase (0/2) 2  Language : Fluency (0/1) 1  Abstraction (0/2) 2  Delayed Recall (0/5) 5   Orientation (0/6) 6  Total 28      Mini-Cog - 12/01/20 1050     Normal clock drawing test? yes    How many words correct? 3              Immunizations Immunization History  Administered Date(s) Administered   Fluad Quad(high Dose 65+) 12/12/2018, 12/04/2019   Influenza, High Dose Seasonal PF 12/10/2017   Moderna Sars-Covid-2 Vaccination 12/04/2019, 01/01/2020   Pneumococcal Conjugate-13 01/20/2015, 05/17/2017   Pneumococcal Polysaccharide-23 11/11/2013   Tdap 05/08/2016    TDAP status: Up to date  Flu Vaccine status: Completed at today's visit  Pneumococcal vaccine status: Up to date  Covid-19 vaccine status: Information provided on how to obtain vaccines.   Qualifies for Shingles Vaccine? Yes   Zostavax completed Yes   Shingrix Completed?: No.    Education has been provided regarding the importance of this vaccine. Patient has been advised to call insurance company to determine out of pocket expense if they have not yet received this vaccine. Advised may also receive vaccine at local pharmacy or Health Dept. Verbalized acceptance and understanding.  Screening Tests Health Maintenance  Topic Date Due   Zoster Vaccines- Shingrix (1 of 2) Never done   DEXA SCAN  Never done   COVID-19 Vaccine (3 - Moderna risk series) 01/29/2020   INFLUENZA VACCINE  10/11/2020   TETANUS/TDAP  05/08/2026   HPV VACCINES  Aged Out    Health Maintenance  Health Maintenance Due  Topic Date Due   Zoster Vaccines- Shingrix (1 of 2) Never done   DEXA SCAN  Never done   COVID-19 Vaccine (3 - Moderna risk series) 01/29/2020   INFLUENZA VACCINE  10/11/2020    Colorectal cancer screening: No longer required.   Mammogram status: No longer required due to age.  Dexa - decline  Lung Cancer  Screening: (Low Dose CT Chest recommended if Age 55-80 years, 30 pack-year currently smoking OR have quit w/in 15years.) does not qualify.   Lung Cancer Screening Referral: n/a  Additional  Screening:  Hepatitis C Screening: does qualify; Completed yes  Vision Screening: Recommended annual ophthalmology exams for early detection of glaucoma and other disorders of the eye. Is the patient up to date with their annual eye exam?  No  Who is the provider or what is the name of the office in which the patient attends annual eye exams? - she will find someone If pt is not established with a provider, would they like to be referred to a provider to establish care? No .   Dental Screening: Recommended annual dental exams for proper oral hygiene  Community Resource Referral / Chronic Care Management: CRR required this visit?  No   CCM required this visit?  No      Plan:     I have personally reviewed and noted the following in the patient's chart:   Medical and social history Use of alcohol, tobacco or illicit drugs  Current medications and supplements including opioid prescriptions.  Functional ability and status Nutritional status Physical activity Advanced directives List of other physicians Hospitalizations, surgeries, and ER visits in previous 12 months Vitals Screenings to include cognitive, depression, and falls Referrals and appointments  In addition, I have reviewed and discussed with patient certain preventive protocols, quality metrics, and best practice recommendations. A written personalized care plan for preventive services as well as general preventive health recommendations were provided to patient.     [name], MD   12/01/2020

## 2020-12-01 NOTE — Patient Instructions (Addendum)
Shingrix - new shingles vaccine - check with pharmacy  Would also recommend getting the newest Covid booster  Your DEXA showed osteopenia.   This means that she is at risk for developing osteoporosis and have some signs of low bone mass.   Would recommend the following:   1) 800 units of Vitamin D daily 2) Get 1200 mg of elemental calcium --- this is best from your diet. Try to track how much calcium you get on a typical day. You could find ways to add more (dairy products, leafy greens). Take a supplement for whatever you don't typically get so you reach 1200 mg of calcium.  3) Physical activity (ideally weight bearing) - like walking briskly 30 minutes 5 days a week.

## 2021-02-23 DIAGNOSIS — M25562 Pain in left knee: Secondary | ICD-10-CM | POA: Diagnosis not present

## 2021-02-23 DIAGNOSIS — M7052 Other bursitis of knee, left knee: Secondary | ICD-10-CM | POA: Diagnosis not present

## 2021-02-23 DIAGNOSIS — M1712 Unilateral primary osteoarthritis, left knee: Secondary | ICD-10-CM | POA: Diagnosis not present

## 2021-04-01 ENCOUNTER — Encounter: Payer: Self-pay | Admitting: Nurse Practitioner

## 2021-04-01 ENCOUNTER — Ambulatory Visit (INDEPENDENT_AMBULATORY_CARE_PROVIDER_SITE_OTHER): Payer: Medicare Other | Admitting: Nurse Practitioner

## 2021-04-01 ENCOUNTER — Other Ambulatory Visit: Payer: Self-pay

## 2021-04-01 VITALS — BP 132/62 | HR 57 | Ht 65.5 in | Wt 183.0 lb

## 2021-04-01 DIAGNOSIS — E782 Mixed hyperlipidemia: Secondary | ICD-10-CM | POA: Diagnosis not present

## 2021-04-01 DIAGNOSIS — I25118 Atherosclerotic heart disease of native coronary artery with other forms of angina pectoris: Secondary | ICD-10-CM | POA: Diagnosis not present

## 2021-04-01 DIAGNOSIS — I10 Essential (primary) hypertension: Secondary | ICD-10-CM | POA: Diagnosis not present

## 2021-04-01 DIAGNOSIS — I6529 Occlusion and stenosis of unspecified carotid artery: Secondary | ICD-10-CM

## 2021-04-01 MED ORDER — AMLODIPINE BESYLATE 5 MG PO TABS: 5.0000 mg | ORAL_TABLET | Freq: Every day | ORAL | 0 refills | Status: DC

## 2021-04-01 MED ORDER — AMLODIPINE BESYLATE 5 MG PO TABS: 5.0000 mg | ORAL_TABLET | Freq: Every day | ORAL | 3 refills | Status: DC

## 2021-04-01 NOTE — Progress Notes (Signed)
Office Visit    Patient Name: Christine [name] Date of Encounter: 04/01/2021  Primary Care Provider:  [name], MD Primary Cardiologist:  Nelva Bush, MD  Chief Complaint    88 year old female with a history of nonobstructive CAD, hypertension, hyperlipidemia, rheumatic fever, asthma, GERD, carotid arterial disease, and hypothyroidism, who presents for follow-up related to HTN/HL.  Past Medical History    Past Medical History:  Diagnosis Date   Asthma    Carotid arterial disease ([place])    a. 06/2019 Carotid U/S: RICA 50-69%. Mod LICA plaque.   Chicken pox    Diastolic dysfunction    a. 02/2020 Echo: EF 60-65%, no rwma, GrI DD. Nl RV size/fxn. Triv MR. Mild-mod Ao sclerosis w/o stenois.   Frequent headaches    GERD (gastroesophageal reflux disease)    Heart murmur    a. 02/2020 Echo: Triv MR. Mild-mod Ao sclerosis w/o stenosis.   Hyperlipidemia    Hypertension    Hypothyroidism    Nonobstructive CAD (coronary artery disease)    a.  Remote Cath in [facility] - reportedly nonobs dzs; b. 03/2020 Cor CTA:  Ca2+ = 1844 (94th %'ile). LAD 50p, RCA 25-49 (FFRct nl in prox RCA w/ slow taper in mid-dist segments from 0.8-->0.6).   Rheumatic fever    Urine incontinence    Past Surgical History:  Procedure Laterality Date   ABDOMINAL HYSTERECTOMY     APPENDECTOMY     BREAST BIOPSY     MASTECTOMY Bilateral 04/1979    Allergies  Allergies  Allergen Reactions   Pollen Extract     History of Present Illness    88 year old female with a history of nonobstructive CAD, hypertension, hyperlipidemia, rheumatic fever, asthma, carotid arterial disease, GERD, and hypothyroidism.  She previously establish cardiology care in December 2021 in the setting of orthopnea, leg swelling, dizziness, fatigue, and orthostatic hypotension/lightheadedness.  She reported at that time that she had previously undergone diagnostic catheterization in [place] over 20 years ago and believes that she had  some nonobstructive plaque, but did not require intervention.  Given symptoms, she underwent echocardiography which showed an EF of 60 to 65% without regional wall motion abnormalities.  Grade 1 diastolic dysfunction as well as mild to moderate aortic sclerosis without stenosis were noted.  Coronary CT angiography was performed in January 2022 revealing a calcium score of 1844 (94th percentile).  She was noted to have 50% proximal stenosis in the LAD and calcified stenosis in the RCA with normal FFR CT in the proximal RCA and slow taper in the mid to distal segments from 0.8-0.6.  She has been medically managed since with low-dose aspirin and statin therapy.    Ms. Willadsen was last seen in cardiology clinic in June 2022, at which time she reported stable, chronic dyspnea on exertion, and occasional predominantly resting but sometimes exertional upper back pain.  Given lack of focal stenosis and coronary CTA, plan was to continue medical therapy.  Since her last visit, she has overall done well.  She had to come off of atorvastatin therapy in July in the setting of myalgias and this was subsequently replaced with rosuvastatin.  She has tolerated this well.  Her LDL was 72 in September 2022.  She ran out of amlodipine in September and when she did not have a refill, she thought that it was intentionally discontinued.  She checks her blood pressure at home periodically and numbers are typically in the 140s.  She has chronic dyspnea on exertion, which  is stable and when compared to a year ago, is actually improved.  She continues to have intermittent discomfort between her shoulder blades, almost exclusively when she is getting ready to lie down for bed at night.  There are no associated symptoms and symptoms resolve spontaneously.  She does not experience any discomfort in her chest or back with activity.  She notes that she stays busy throughout the day without significant symptoms or limitations.  She denies  palpitations, PND, orthopnea, dizziness, syncope, edema, or early satiety.  Home Medications    Current Outpatient Medications  Medication Sig Dispense Refill   amLODipine (NORVASC) 5 MG tablet Take 1 tablet (5 mg total) by mouth daily. 90 tablet 3   aspirin EC 81 MG tablet Take 1 tablet (81 mg total) by mouth daily. Swallow whole. 90 tablet 3   escitalopram (LEXAPRO) 10 MG tablet Take 1 tablet (10 mg total) by mouth daily. 90 tablet 3   Fluticasone Propionate, Inhal, 250 MCG/BLIST AEPB Inhale 1 puff into the lungs in the morning and at bedtime. 60 each 3   levothyroxine (SYNTHROID) 75 MCG tablet Take 1 tablet (75 mcg total) by mouth daily. 90 tablet 3   loratadine (CLARITIN) 10 MG tablet Take 10 mg by mouth daily as needed for allergies.     losartan (COZAAR) 50 MG tablet TAKE 1 TABLET DAILY 90 tablet 3   RABEprazole (ACIPHEX) 20 MG tablet TAKE 1 TABLET DAILY 90 tablet 3   rosuvastatin (CRESTOR) 20 MG tablet Take 1 tablet (20 mg total) by mouth daily. 90 tablet 3   amLODipine (NORVASC) 5 MG tablet Take 1 tablet (5 mg total) by mouth daily. 30 tablet 0   No current facility-administered medications for this visit.     Review of Systems    Chronic, stable dyspnea exertion.  Intermittent back pain between her shoulder blades, typically at night while resting.  She denies chest pain, palpitations, PND, orthopnea, dizziness, syncope, edema, or early satiety.  All other systems reviewed and are otherwise negative except as noted above.    Physical Exam    VS:  BP 132/62 (BP Location: Left Arm, Patient Position: Sitting, Cuff Size: Normal)    Pulse (!) 57    Ht 5' 5.5" (1.664 m)    Wt 183 lb (83 kg)    SpO2 96%    BMI 29.99 kg/m  , BMI Body mass index is 29.99 kg/m.     GEN: Well nourished, well developed, in no acute distress. HEENT: normal. Neck: Supple, no JVD, soft bilateral carotid bruits versus radiated aortic murmur.  No masses. Cardiac: RRR, 2/6 systolic ejection murmur at the  upper sternal borders, no rubs or gallops. No clubbing, cyanosis, edema.  Radials/PT 2+ and equal bilaterally.  Respiratory:  Respirations regular and unlabored, clear to auscultation bilaterally. GI: Soft, nontender, nondistended, BS + x 4. MS: no deformity or atrophy. Skin: warm and dry, no rash. Neuro:  Strength and sensation are intact. Psych: Normal affect.  Accessory Clinical Findings    ECG personally reviewed by me today -sinus bradycardia, 57- no acute changes.  Lab Results  Component Value Date   WBC 6.3 12/01/2020   HGB 15.2 (H) 12/01/2020   HCT 45.4 12/01/2020   MCV 89.9 12/01/2020   PLT 180.0 12/01/2020   Lab Results  Component Value Date   CREATININE 0.90 12/01/2020   BUN 18 12/01/2020   NA 138 12/01/2020   K 4.6 12/01/2020   CL 100 12/01/2020   CO2  27 12/01/2020   Lab Results  Component Value Date   ALT 20 12/01/2020   AST 25 12/01/2020   ALKPHOS 76 12/01/2020   BILITOT 0.8 12/01/2020   Lab Results  Component Value Date   CHOL 170 12/01/2020   HDL 65.30 12/01/2020   LDLCALC 72 12/01/2020   LDLDIRECT 116.0 02/12/2020   TRIG 165.0 (H) 12/01/2020   CHOLHDL 3 12/01/2020      Assessment & Plan    1.  Nonobstructive coronary artery disease: In the setting of dyspnea on exertion, patient underwent coronary CT angiography in January 2022, which showed moderate LAD and RCA disease with normal FFR.  She has been medically managed with aspirin and statin.  She is also supposed to be taking amlodipine but when she ran out of refills in September, she did not resume.  Blood pressures have been higher in that setting.  Similar to what she was experiencing 6 months ago, she has intermittent upper back discomfort, typically occurring at rest when she is readying for bed at night.  She does not experience any exertional symptoms despite being pretty active throughout the day.  She denies chest pain.  Symptoms do not appear to be anginal in nature and CTA last year are  reassuring.  Resuming amlodipine today.  Continue aspirin and rosuvastatin.  She will notify us if upper back symptoms worsen.  2.  Essential HTN: She accidentally discontinued amlodipine after it was not refilled and she thought maybe it was intentionally discontinued.  BPs been trending in the 140s at home.  I will resume amlodipine 5 mg daily as previously prescribed.  3.  Hyperlipidemia: Did not tolerate atorvastatin secondary to myalgias.  Tolerating rosuvastatin well with an LDL of 72 in September.  LFTs were normal at that time.  4.  Carotid arterial disease: Last ultrasound in April 2021 showed a 14 to 69% right internal carotid artery stenosis and nonobstructive plaque in the left.  She is overdue for follow-up.  I will arrange.  Continue aspirin and statin therapy.  5.  Disposition: Follow-up carotid ultrasound.  Follow-up in clinic in 6 months or sooner if necessary.  Murray Hodgkins, NP 04/01/2021, 10:51 AM

## 2021-04-01 NOTE — Patient Instructions (Addendum)
Medication Instructions:  No changes at this time.   Amlodipine 5 mg once a day (30 day supply) sent to [facility] Amlodipine 5 mg once a day (90 day supply) sent to Mail Order.    *If you need a refill on your cardiac medications before your next appointment, please call your pharmacy*   Lab Work: None  If you have labs (blood work) drawn today and your tests are completely normal, you will receive your results only by: [place] (if you have MyChart) OR A paper copy in the mail If you have any lab test that is abnormal or we need to change your treatment, we will call you to review the results.   Testing/Procedures: Your physician has requested that you have a carotid duplex. This test is an ultrasound of the carotid arteries in your neck. It looks at blood flow through these arteries that supply the brain with blood. Allow one hour for this exam. There are no restrictions or special instructions.    Follow-Up: At [facility], you and your health needs are our priority.  As part of our continuing mission to provide you with exceptional heart care, we have created designated Provider Care Teams.  These Care Teams include your primary Cardiologist (physician) and Advanced Practice Providers (APPs -  Physician Assistants and Nurse Practitioners) who all work together to provide you with the care you need, when you need it.   Your next appointment:   6 month(s)  The format for your next appointment:   In Person  Provider:   Nelva Bush, MD or Murray Hodgkins, NP

## 2021-04-13 DIAGNOSIS — M1712 Unilateral primary osteoarthritis, left knee: Secondary | ICD-10-CM | POA: Diagnosis not present

## 2021-04-13 DIAGNOSIS — M25462 Effusion, left knee: Secondary | ICD-10-CM | POA: Diagnosis not present

## 2021-04-13 DIAGNOSIS — G8929 Other chronic pain: Secondary | ICD-10-CM | POA: Diagnosis not present

## 2021-04-13 DIAGNOSIS — M25562 Pain in left knee: Secondary | ICD-10-CM | POA: Diagnosis not present

## 2021-04-15 ENCOUNTER — Other Ambulatory Visit: Payer: Self-pay | Admitting: Nurse Practitioner

## 2021-04-15 DIAGNOSIS — I6529 Occlusion and stenosis of unspecified carotid artery: Secondary | ICD-10-CM

## 2021-04-26 ENCOUNTER — Encounter

## 2021-04-27 ENCOUNTER — Other Ambulatory Visit: Payer: Self-pay

## 2021-04-27 ENCOUNTER — Ambulatory Visit (INDEPENDENT_AMBULATORY_CARE_PROVIDER_SITE_OTHER): Payer: Medicare Other

## 2021-04-27 DIAGNOSIS — I6523 Occlusion and stenosis of bilateral carotid arteries: Secondary | ICD-10-CM | POA: Diagnosis not present

## 2021-04-27 DIAGNOSIS — I6529 Occlusion and stenosis of unspecified carotid artery: Secondary | ICD-10-CM

## 2021-04-28 ENCOUNTER — Other Ambulatory Visit: Payer: Self-pay | Admitting: *Deleted

## 2021-04-28 DIAGNOSIS — I6529 Occlusion and stenosis of unspecified carotid artery: Secondary | ICD-10-CM

## 2021-06-15 ENCOUNTER — Other Ambulatory Visit: Payer: Self-pay | Admitting: Family Medicine

## 2021-06-15 DIAGNOSIS — F321 Major depressive disorder, single episode, moderate: Secondary | ICD-10-CM

## 2021-07-15 ENCOUNTER — Other Ambulatory Visit: Payer: Self-pay | Admitting: Family Medicine

## 2021-07-15 DIAGNOSIS — E039 Hypothyroidism, unspecified: Secondary | ICD-10-CM

## 2021-08-15 ENCOUNTER — Other Ambulatory Visit: Payer: Self-pay | Admitting: Family Medicine

## 2021-08-29 ENCOUNTER — Other Ambulatory Visit: Payer: Self-pay | Admitting: Internal Medicine

## 2021-09-06 ENCOUNTER — Ambulatory Visit (INDEPENDENT_AMBULATORY_CARE_PROVIDER_SITE_OTHER): Payer: Medicare Other | Admitting: Family Medicine

## 2021-09-06 ENCOUNTER — Ambulatory Visit (INDEPENDENT_AMBULATORY_CARE_PROVIDER_SITE_OTHER)
Admission: RE | Admit: 2021-09-06 | Discharge: 2021-09-06 | Disposition: A | Discharge status: Home / Self Care | Payer: Medicare Other | Source: Ambulatory Visit | Attending: Family Medicine | Admitting: Family Medicine

## 2021-09-06 VITALS — BP 160/70 | HR 63 | Temp 97.6°F | Ht 65.0 in | Wt 188.0 lb

## 2021-09-06 DIAGNOSIS — E039 Hypothyroidism, unspecified: Secondary | ICD-10-CM | POA: Diagnosis not present

## 2021-09-06 DIAGNOSIS — I10 Essential (primary) hypertension: Secondary | ICD-10-CM | POA: Diagnosis not present

## 2021-09-06 DIAGNOSIS — R0602 Shortness of breath: Secondary | ICD-10-CM

## 2021-09-06 DIAGNOSIS — K219 Gastro-esophageal reflux disease without esophagitis: Secondary | ICD-10-CM | POA: Diagnosis not present

## 2021-09-06 DIAGNOSIS — R079 Chest pain, unspecified: Secondary | ICD-10-CM | POA: Diagnosis not present

## 2021-09-06 LAB — CBC WITH DIFFERENTIAL/PLATELET
Basophils Absolute: 0 10*3/uL (ref 0.0–0.1)
Basophils Relative: 0.5 % (ref 0.0–3.0)
Eosinophils Absolute: 0.2 10*3/uL (ref 0.0–0.7)
Eosinophils Relative: 4.2 % (ref 0.0–5.0)
HCT: 44.4 % (ref 36.0–46.0)
Hemoglobin: 15 g/dL (ref 12.0–15.0)
Lymphocytes Relative: 30 % (ref 12.0–46.0)
Lymphs Abs: 1.8 10*3/uL (ref 0.7–4.0)
MCHC: 33.7 g/dL (ref 30.0–36.0)
MCV: 88 fl (ref 78.0–100.0)
Monocytes Absolute: 0.5 10*3/uL (ref 0.1–1.0)
Monocytes Relative: 7.7 % (ref 3.0–12.0)
Neutro Abs: 3.4 10*3/uL (ref 1.4–7.7)
Neutrophils Relative %: 57.6 % (ref 43.0–77.0)
Platelets: 202 10*3/uL (ref 150.0–400.0)
RBC: 5.05 Mil/uL (ref 3.87–5.11)
RDW: 13 % (ref 11.5–15.5)
WBC: 5.9 10*3/uL (ref 4.0–10.5)

## 2021-09-06 LAB — COMPREHENSIVE METABOLIC PANEL
ALT: 21 U/L (ref 0–35)
AST: 22 U/L (ref 0–37)
Albumin: 4.5 g/dL (ref 3.5–5.2)
Alkaline Phosphatase: 74 U/L (ref 39–117)
BUN: 16 mg/dL (ref 6–23)
CO2: 22 mEq/L (ref 19–32)
Calcium: 10 mg/dL (ref 8.4–10.5)
Chloride: 101 mEq/L (ref 96–112)
Creatinine, Ser: 0.76 mg/dL (ref 0.40–1.20)
GFR: 71.02 mL/min (ref >60.00)
Glucose, Bld: 100 mg/dL — ABNORMAL HIGH (ref 70–99)
Potassium: 4.1 mEq/L (ref 3.5–5.1)
Sodium: 137 mEq/L (ref 135–145)
Total Bilirubin: 0.6 mg/dL (ref 0.2–1.2)
Total Protein: 7.1 g/dL (ref 6.0–8.3)

## 2021-09-06 LAB — TSH: TSH: 1.26 u[IU]/mL (ref 0.35–5.50)

## 2021-09-06 LAB — BRAIN NATRIURETIC PEPTIDE: Pro B Natriuretic peptide (BNP): 49 pg/mL (ref 0.0–100.0)

## 2021-09-06 MED ORDER — RABEPRAZOLE SODIUM 20 MG PO TBEC: 20.0000 mg | DELAYED_RELEASE_TABLET | Freq: Every day | ORAL | 3 refills | Status: DC

## 2021-09-06 NOTE — Assessment & Plan Note (Signed)
Patient with new and worsening shortness of breath.  Continue levo 75 mcg, repeat TSH today.

## 2021-09-06 NOTE — Assessment & Plan Note (Addendum)
Prior echo with normal EF.  EKG nonischemic today.  No wheezing on exam to suggest asthma exacerbation.  She does have trace edema and JVD present.  Suspect this may be heart failure, BNP and chest x-ray ordered along with labs.  Pending results anticipate diuretic and repeat echo.  Return in 3 weeks for check-in.

## 2021-09-06 NOTE — Assessment & Plan Note (Signed)
Some worsening of symptoms, though unclear if her nighttime symptoms are related to shortness of breath or GERD.  Continue Aciphex.

## 2021-09-06 NOTE — Assessment & Plan Note (Signed)
Blood pressure elevated.  Increase losartan to 100 mg from 50.  Continue amlodipine 5 mg daily.  Labs today.  Home monitoring and follow-up in 3 weeks.

## 2021-09-07 ENCOUNTER — Other Ambulatory Visit: Payer: Self-pay | Admitting: Family Medicine

## 2021-09-07 DIAGNOSIS — J209 Acute bronchitis, unspecified: Secondary | ICD-10-CM

## 2021-09-07 MED ORDER — PREDNISONE 20 MG PO TABS: 40.0000 mg | ORAL_TABLET | Freq: Every day | ORAL | 0 refills | Status: AC

## 2021-09-27 ENCOUNTER — Ambulatory Visit (INDEPENDENT_AMBULATORY_CARE_PROVIDER_SITE_OTHER): Payer: Medicare Other | Admitting: Family Medicine

## 2021-09-27 ENCOUNTER — Other Ambulatory Visit: Payer: Self-pay | Admitting: Family Medicine

## 2021-09-27 VITALS — BP 160/70 | HR 60 | Temp 97.3°F | Ht 65.0 in | Wt 193.1 lb

## 2021-09-27 DIAGNOSIS — J4520 Mild intermittent asthma, uncomplicated: Secondary | ICD-10-CM | POA: Diagnosis not present

## 2021-09-27 DIAGNOSIS — R400 Somnolence: Secondary | ICD-10-CM | POA: Insufficient documentation

## 2021-09-27 DIAGNOSIS — I10 Essential (primary) hypertension: Secondary | ICD-10-CM

## 2021-09-27 MED ORDER — ALBUTEROL SULFATE HFA 108 (90 BASE) MCG/ACT IN AERS: 2.0000 | INHALATION_SPRAY | Freq: Four times a day (QID) | RESPIRATORY_TRACT | 2 refills | Status: AC | PRN

## 2021-09-27 MED ORDER — HYDROCHLOROTHIAZIDE 25 MG PO TABS: 25.0000 mg | ORAL_TABLET | Freq: Every day | ORAL | 0 refills | Status: DC

## 2021-09-27 MED ORDER — FLUTICASONE-SALMETEROL 250-50 MCG/ACT IN AEPB: 1.0000 | INHALATION_SPRAY | Freq: Two times a day (BID) | RESPIRATORY_TRACT | 1 refills | Status: DC

## 2021-09-27 NOTE — Assessment & Plan Note (Signed)
Suspect her shortness of breath is secondary to her asthma.  Discussed switching from fluticasone alone to Advair.  Albuterol as needed as a rescue inhaler.  Return in 1 to 2 months for check-in.

## 2021-09-27 NOTE — Assessment & Plan Note (Addendum)
Blood pressure remains elevated, here and at home.  Continue amlodipine 5 mg, losartan 100 mg, start hydrochlorothiazide 25 mg. Return in 2 weeks for labs bring home monitoring.  Cardiology appointment is next month

## 2021-09-27 NOTE — Assessment & Plan Note (Signed)
Epworth of 16, and patient with newly worsening high blood pressure and some breathing difficulty.  Discussed concern for possible sleep apnea referral to pulmonology for evaluation.

## 2021-09-27 NOTE — Progress Notes (Signed)
Subjective:     Christine [name] is a 88 y.o. female presenting for Follow-up (BP )     HPI  #HTN - no longer swelling today - breathing is better - going up and down - sometimes bad - bp still elevated ranging from 129-180s/70-110s, majority >140/90  #Breathing  - worse is exposure to smoke or outside - sometimes at night it is bad - using fluticasone daily inhaler - does not have albuterol -   #Skin lesions - follows with dermatology - has several spots on her legs - itching lesion on the ear   Review of Systems   Social History   Tobacco Use  Smoking Status Never  Smokeless Tobacco Never        Objective:    BP Readings from Last 3 Encounters:  09/27/21 (!) 160/70  09/06/21 (!) 160/70  04/01/21 132/62   Wt Readings from Last 3 Encounters:  09/27/21 193 lb 2 oz (87.6 kg)  09/06/21 188 lb (85.3 kg)  04/01/21 183 lb (83 kg)    BP (!) 160/70   Pulse 60   Temp (!) 97.3 F (36.3 C) (Temporal)   Ht '5\' 5"'$  (1.651 m)   Wt 193 lb 2 oz (87.6 kg)   SpO2 98%   BMI 32.14 kg/m    Physical Exam Constitutional:      General: She is not in acute distress.    Appearance: She is well-developed. She is not diaphoretic.  HENT:     Right Ear: External ear normal.     Left Ear: External ear normal.     Nose: Nose normal.  Eyes:     Conjunctiva/sclera: Conjunctivae normal.  Cardiovascular:     Rate and Rhythm: Normal rate and regular rhythm.     Heart sounds: No murmur heard. Pulmonary:     Effort: Pulmonary effort is normal. No respiratory distress.     Breath sounds: Rales (bibasilar) present. No wheezing.  Musculoskeletal:     Cervical back: Neck supple.  Skin:    General: Skin is warm and dry.     Capillary Refill: Capillary refill takes less than 2 seconds.  Neurological:     Mental Status: She is alert. Mental status is at baseline.  Psychiatric:        Mood and Affect: Mood normal.        Behavior: Behavior normal.           09/27/2021   11:00 AM  Results of the Epworth flowsheet  Sitting and reading 3  Watching TV 3  Sitting, inactive in a public place (e.g. a theatre or a meeting) 0  As a passenger in a car for an hour without a break 3  Lying down to rest in the afternoon when circumstances permit 3  Sitting and talking to someone 2  Sitting quietly after a lunch without alcohol 2  In a car, while stopped for a few minutes in traffic 0  Total score 16       Assessment & Plan:   Problem List Items Addressed This Visit       Cardiovascular and Mediastinum   Essential hypertension    Blood pressure remains elevated, here and at home.  Continue amlodipine 5 mg, losartan 100 mg, start hydrochlorothiazide 25 mg. Return in 2 weeks for labs bring home monitoring.  Cardiology appointment is next month      Relevant Medications   hydrochlorothiazide (HYDRODIURIL) 25 MG tablet   Other Relevant Orders  Ambulatory referral to Pulmonology   Basic metabolic panel     Respiratory   Mild intermittent asthma without complication - Primary    Suspect her shortness of breath is secondary to her asthma.  Discussed switching from fluticasone alone to Advair.  Albuterol as needed as a rescue inhaler.  Return in 1 to 2 months for check-in.      Relevant Medications   albuterol (VENTOLIN HFA) 108 (90 Base) MCG/ACT inhaler   fluticasone-salmeterol (ADVAIR) 250-50 MCG/ACT AEPB   Other Relevant Orders   Ambulatory referral to Pulmonology     Other   Daytime sleepiness    Epworth of 16, and patient with newly worsening high blood pressure and some breathing difficulty.  Discussed concern for possible sleep apnea referral to pulmonology for evaluation.      Relevant Orders   Ambulatory referral to Pulmonology     Return in about 7 weeks (around 11/15/2021) for asthma, blood pressure.  [name], MD

## 2021-09-27 NOTE — Patient Instructions (Addendum)
High blood pressure - Continue Amlodipine 5 mg - Continue Losartan 100 mg  - Start Hydrochlorothiazide 25 mg - continue to monitor blood pressure - return for labs and bring home log in 2 weeks  Asthma - Stop Fluticasone - start Advair every day - Albuterol - rescue inhaler - use as needed for shortness of breath --- ideally albuterol is not an everyday medication as the goal is to control your asthma  #Referral I have placed a referral to a specialist for you. You should receive a phone call from the specialty office. Make sure your voicemail is not full and that if you are able to answer your phone to unknown or new numbers.   It may take up to 2 weeks to hear about the referral. If you do not hear anything in 2 weeks, please call our office and ask to speak with the referral coordinator.

## 2021-10-03 DIAGNOSIS — Z85828 Personal history of other malignant neoplasm of skin: Secondary | ICD-10-CM | POA: Diagnosis not present

## 2021-10-03 DIAGNOSIS — D225 Melanocytic nevi of trunk: Secondary | ICD-10-CM | POA: Diagnosis not present

## 2021-10-03 DIAGNOSIS — D485 Neoplasm of uncertain behavior of skin: Secondary | ICD-10-CM | POA: Diagnosis not present

## 2021-10-03 DIAGNOSIS — L570 Actinic keratosis: Secondary | ICD-10-CM | POA: Diagnosis not present

## 2021-10-03 DIAGNOSIS — D2272 Melanocytic nevi of left lower limb, including hip: Secondary | ICD-10-CM | POA: Diagnosis not present

## 2021-10-03 DIAGNOSIS — D2261 Melanocytic nevi of right upper limb, including shoulder: Secondary | ICD-10-CM | POA: Diagnosis not present

## 2021-10-03 DIAGNOSIS — L821 Other seborrheic keratosis: Secondary | ICD-10-CM | POA: Diagnosis not present

## 2021-10-03 DIAGNOSIS — C44219 Basal cell carcinoma of skin of left ear and external auricular canal: Secondary | ICD-10-CM | POA: Diagnosis not present

## 2021-10-06 NOTE — Progress Notes (Unsigned)
Cardiology Office Note    Date:  10/10/2021   ID:  Christine [name], DOB May 29, 1933, MRN [id number]  PCP:  [name], MD  Cardiologist:  Nelva Bush, MD  Electrophysiologist:  None   Chief Complaint: Follow-up  History of Present Illness:   Christine [name] is a 88 y.o. female with history of nonobstructive CAD, HTN, HLD, rheumatic fever, asthma, carotid artery disease, hypothyroidism, and GERD who presents for follow-up of CAD.  She previously established with cardiology in 02/2020 in the setting of orthopnea, lower extremity swelling, dizziness, fatigue, and orthostatic hypotension.  She reported at that time that she had previously undergone diagnostic cath in [place] over 20 years prior and believed it had shown nonobstructive disease.  Given symptoms, upon establishing with [place], echo showed an EF of 60 to 65% with no regional wall motion abnormalities, grade 1 diastolic dysfunction, and mild to moderate aortic valve sclerosis without evidence of stenosis.  Coronary CTA in 03/2020 showed a calcium score of 1844 (94 percentile) along with 50% proximal stenosis in the LAD and calcified stenosis in the RCA with normal FFR CT in the proximal RCA and a slow taper in the mid to distal segments from 0.8-0.6.  She was medically managed.  She was last seen in the office in 03/2021 with noted chronic dyspnea on exertion that was stable to improved.  She continued to note intermittent discomfort between her shoulder blades that was almost exclusively when she was getting ready to lie down for bed at night.  She had been off amlodipine which was resumed.  Carotid artery ultrasound from 04/2021 demonstrated 1 to 39% bilateral ICA stenosis with antegrade flow of the bilateral vertebral arteries and normal flow hemodynamics in the bilateral subclavian arteries.  She comes in doing reasonably well from a cardiac perspective and is without symptoms of angina or decompensation.  She has recently been  evaluated by her PCP for increasing shortness of breath with normal BNP and chest x-ray suggestive of emphysema.  Symptoms were felt to be related to underlying asthma.  She does note a decrease in her physical activity over the past several months.  Intermittent lower extremity swelling that progresses throughout the day and improves when laying down at night.  She has had some labile blood pressure readings, though readings were well controlled at home this morning and in the office.  Despite the above, she feels like she is doing reasonably well.   Labs independently reviewed: 08/2021 - TSH normal, HGB 15.0, PLT 202, potassium 4.1, BUN 16, SCr 0.76, albumin 4.5, AST/ALT normal 11/2020 - TC 170, TG 165, HDL 65, LDL 72  Past Medical History:  Diagnosis Date   Asthma    Carotid arterial disease ([place])    a. 06/2019 Carotid U/S: RICA 50-69%. Mod LICA plaque.   Chicken pox    Diastolic dysfunction    a. 02/2020 Echo: EF 60-65%, no rwma, GrI DD. Nl RV size/fxn. Triv MR. Mild-mod Ao sclerosis w/o stenois.   Frequent headaches    GERD (gastroesophageal reflux disease)    Heart murmur    a. 02/2020 Echo: Triv MR. Mild-mod Ao sclerosis w/o stenosis.   Hyperlipidemia    Hypertension    Hypothyroidism    Nonobstructive CAD (coronary artery disease)    a.  Remote Cath in [facility] - reportedly nonobs dzs; b. 03/2020 Cor CTA:  Ca2+ = 1844 (94th %'ile). LAD 50p, RCA 25-49 (FFRct nl in prox RCA w/ slow taper in mid-dist segments from 0.8-->0.6).  Rheumatic fever    Urine incontinence     Past Surgical History:  Procedure Laterality Date   ABDOMINAL HYSTERECTOMY     APPENDECTOMY     BREAST BIOPSY     MASTECTOMY Bilateral 04/1979    Current Medications: Current Meds  Medication Sig   albuterol (VENTOLIN HFA) 108 (90 Base) MCG/ACT inhaler Inhale 2 puffs into the lungs every 6 (six) hours as needed for wheezing or shortness of breath.   aspirin EC 81 MG tablet Take 1 tablet (81 mg total) by mouth daily.  Swallow whole.   escitalopram (LEXAPRO) 10 MG tablet TAKE 1 TABLET DAILY   levothyroxine (SYNTHROID) 75 MCG tablet TAKE 1 TABLET DAILY   loratadine (CLARITIN) 10 MG tablet Take 10 mg by mouth daily as needed for allergies.   RABEprazole (ACIPHEX) 20 MG tablet Take 1 tablet (20 mg total) by mouth daily.   [DISCONTINUED] amLODipine (NORVASC) 5 MG tablet Take 1 tablet (5 mg total) by mouth daily.   [DISCONTINUED] hydrochlorothiazide (HYDRODIURIL) 25 MG tablet Take 1 tablet (25 mg total) by mouth daily.   [DISCONTINUED] losartan (COZAAR) 50 MG tablet TAKE 1 TABLET DAILY   [DISCONTINUED] rosuvastatin (CRESTOR) 20 MG tablet TAKE 1 TABLET DAILY    Allergies:   Atorvastatin and Pollen extract   Social History   Socioeconomic History   Marital status: Widowed    Spouse name: Not on file   Number of children: 4   Years of education: college   Highest education level: Not on file  Occupational History   Not on file  Tobacco Use   Smoking status: Never   Smokeless tobacco: Never  Vaping Use   Vaping Use: Never used  Substance and Sexual Activity   Alcohol use: Yes    Comment: maybe once a year   Drug use: Never   Sexual activity: Not Currently  Other Topics Concern   Not on file  Social History Narrative   06/03/19   From: [place] originally, moved here to be near daughter   Living: with daughter - Faythe Dingwall Scientist, clinical (histocompatibility and immunogenetics))   Work: retired from Higher education careers adviser, Education officer, museum      Family: 4 children - Ronni and [place] in [place], daughter in [place] and has adopted daughter in [place]       Enjoys: play tennis, golf, walking, board games, gardening      Exercise: not currently - walking around the house   Diet: not as good as it should be, usually tries to be healthy, avoids fried foods, limits red meat      Safety   Seat belts: Yes    Guns: no   Safe in relationships: Yes    Social Determinants of Radio broadcast assistant Strain: Not on file  Food Insecurity: Not on file   Transportation Needs: Not on file  Physical Activity: Not on file  Stress: Not on file  Social Connections: Not on file     Family History:  The patient's family history includes Early death in her mother; Heart attack (age of onset: 18) in her brother; Heart disease in her brother; Heart disease (age of onset: 20) in her brother; Kidney disease in her mother; Leukemia in her daughter; Other in her father; Valvular heart disease in her daughter.  ROS:   12-point review of systems is negative unless otherwise noted in HPI.   EKGs/Labs/Other Studies Reviewed:    Studies reviewed were summarized above. The additional studies were reviewed today:  Carotid artery ultrasound 04/27/2021:  Summary:  Right Carotid: Velocities in the right ICA are consistent with a 1-39%  stenosis.  Non-hemodynamically significant plaque <50% noted in the CCA.                 The ECA appears >50% stenosed.   Left Carotid: Velocities in the left ICA are consistent with a 1-39%  stenosis.  Non-hemodynamically significant plaque <50% noted in the  CCA.  The ECA appears >50% stenosed.   Vertebrals:  Bilateral vertebral arteries demonstrate antegrade flow.  Subclavians: Normal flow hemodynamics were seen in bilateral subclavian arteries. __________  Coronary CTA with CT FFR 04/08/2020: Aorta: Normal size. Mild aortic root wall calcifications. No dissection.   Aortic Valve:  Trileaflet.  No calcifications.   Coronary Arteries:  Normal coronary origin.  Right dominance.   RCA is a dominant artery that gives rise to PDA and PLA. There is calcified plaque throughout the RCA causing mild stenosis (25-49%).   Left main is a large artery that gives rise to LAD and LCX arteries. There is mild calcification in the mid to distal LM causing minimal stenosis (<25%).   LAD is heavily calcified in the proximal to mid segments causing moderate (50%) stenosis in the proximal segment and mild stenosis (25-49%) in the  mid segment.   LCX is a non-dominant artery that gives rise to one obtuse marginal branch. There is no plaque.   Other findings:   Normal pulmonary vein drainage into the left atrium.   Normal left atrial appendage without a thrombus.   Normal size of the pulmonary artery.   IMPRESSION: 1. High coronary calcium score of 1844. This was 94th percentile for age and sex matched control. 2. Normal coronary origin with right dominance. 3. Calcified plaque causing moderate stenosis (50%) in the proximal LAD. 4. Calcified plaque causing mild (25-49%) stenosis throughout the RCA and mid LAD. 5. CAD-RADS 3. Moderate stenosis. Consider symptom-guided anti-ischemic pharmacotherapy as well as risk factor modification per guideline directed care. 6. Additional analysis with CT FFR will be submitted and reported separately.  CT FFR: 1. Left Main:  No significant stenosis.   2. LAD: No significant stenosis. 3. LCX: No significant stenosis. 4. RCA: No significant focal stenosis. FFRct normal in the proximal RCA segment with slow taper in the mid to distal segments from 0.8 to 0.6 distally.   IMPRESSION: 1. CT FFR analysis didn't show any significant focal stenosis in the LAD or LCx. 2.  Slow taper in FFRct values in the RCA with no focal stenosis. 3.  Recommend medical management. __________  2D echo 03/09/2020: 1. Left ventricular ejection fraction, by estimation, is 60 to 65%. The  left ventricle has normal function. The left ventricle has no regional  wall motion abnormalities. Left ventricular diastolic parameters are  consistent with Grade I diastolic  dysfunction (impaired relaxation).   2. Right ventricular systolic function is normal. The right ventricular  size is normal. Tricuspid regurgitation signal is inadequate for assessing  PA pressure.   3. The mitral valve is degenerative. Trivial mitral valve regurgitation.  No evidence of mitral stenosis.   4. The aortic valve  has an indeterminant number of cusps. There is mild  calcification of the aortic valve. There is moderate thickening of the  aortic valve. Aortic valve regurgitation is trivial. Mild to moderate  aortic valve sclerosis/calcification is  present, without any evidence of aortic stenosis.   5. The inferior vena cava is dilated in size with >50% respiratory  variability, suggesting  right atrial pressure of 8 mmHg.    EKG:  EKG is ordered today.  The EKG ordered today demonstrates NSR, 66 bpm, no acute ST-T changes  Recent Labs: 09/06/2021: ALT 21; BUN 16; Creatinine, Ser 0.76; Hemoglobin 15.0; Platelets 202.0; Potassium 4.1; Pro B Natriuretic peptide (BNP) 49.0; Sodium 137; TSH 1.26  Recent Lipid Panel    Component Value Date/Time   CHOL 170 12/01/2020 1127   CHOL 182 09/08/2020 1045   TRIG 165.0 (H) 12/01/2020 1127   HDL 65.30 12/01/2020 1127   HDL 57 09/08/2020 1045   CHOLHDL 3 12/01/2020 1127   VLDL 33.0 12/01/2020 1127   LDLCALC 72 12/01/2020 1127   LDLCALC 93 09/08/2020 1045   LDLDIRECT 116.0 02/12/2020 1233    PHYSICAL EXAM:    VS:  BP 128/60 (BP Location: Left Arm, Patient Position: Sitting, Cuff Size: Normal)   Pulse 66   Ht '5\' 5"'$  (1.651 m)   Wt 191 lb 4 oz (86.8 kg)   SpO2 98%   BMI 31.83 kg/m   BMI: Body mass index is 31.83 kg/m.  Physical Exam  Wt Readings from Last 3 Encounters:  10/10/21 191 lb 4 oz (86.8 kg)  09/27/21 193 lb 2 oz (87.6 kg)  09/06/21 188 lb (85.3 kg)     ASSESSMENT & PLAN:   Nonobstructive CAD: No symptoms suggestive of angina.  Continue aggressive risk factor modification and current medical therapy including aspirin, amlodipine, and losartan.  Exertional dyspnea: No evidence of volume overload.  Recent BNP normal.  Symptoms have been felt to be related to underlying asthma based on prior outside work-up.  We will obtain an echo for further evaluation.  Could consider Lexiscan MPI if symptoms persist.  HTN: Blood pressure is well  controlled in the office today.  Continue current therapy as outlined by PCP.  HLD: LDL 72.  She remains on rosuvastatin.  Carotid artery disease: 1 to 39% bilateral ICA stenoses in 04/2021.  She remains on aspirin and rosuvastatin.  Venous insufficiency: No swelling noted on exam today.  Recommend leg elevation and compression socks.   Disposition: F/u with Dr. Saunders Revel or an APP in 6 months.   Medication Adjustments/Labs and Tests Ordered: Current medicines are reviewed at length with the patient today.  Concerns regarding medicines are outlined above. Medication changes, Labs and Tests ordered today are summarized above and listed in the Patient Instructions accessible in Encounters.   Signed, Christell Faith, PA-C 10/10/2021 12:17 PM     [facility] [place], [place] [address] [phone]

## 2021-10-10 ENCOUNTER — Encounter: Payer: Self-pay | Admitting: Physician Assistant

## 2021-10-10 ENCOUNTER — Ambulatory Visit (INDEPENDENT_AMBULATORY_CARE_PROVIDER_SITE_OTHER): Payer: Medicare Other | Admitting: Physician Assistant

## 2021-10-10 VITALS — BP 128/60 | HR 66 | Ht 65.0 in | Wt 191.2 lb

## 2021-10-10 DIAGNOSIS — E785 Hyperlipidemia, unspecified: Secondary | ICD-10-CM

## 2021-10-10 DIAGNOSIS — I2510 Atherosclerotic heart disease of native coronary artery without angina pectoris: Secondary | ICD-10-CM | POA: Diagnosis not present

## 2021-10-10 DIAGNOSIS — I6523 Occlusion and stenosis of bilateral carotid arteries: Secondary | ICD-10-CM

## 2021-10-10 DIAGNOSIS — I872 Venous insufficiency (chronic) (peripheral): Secondary | ICD-10-CM | POA: Diagnosis not present

## 2021-10-10 DIAGNOSIS — R0609 Other forms of dyspnea: Secondary | ICD-10-CM | POA: Diagnosis not present

## 2021-10-10 DIAGNOSIS — I6529 Occlusion and stenosis of unspecified carotid artery: Secondary | ICD-10-CM

## 2021-10-10 DIAGNOSIS — I10 Essential (primary) hypertension: Secondary | ICD-10-CM | POA: Diagnosis not present

## 2021-10-10 MED ORDER — ROSUVASTATIN CALCIUM 20 MG PO TABS: 20.0000 mg | ORAL_TABLET | Freq: Every day | ORAL | 3 refills | Status: DC

## 2021-10-10 MED ORDER — LOSARTAN POTASSIUM 50 MG PO TABS: 50.0000 mg | ORAL_TABLET | Freq: Every day | ORAL | 3 refills | Status: DC

## 2021-10-10 MED ORDER — AMLODIPINE BESYLATE 5 MG PO TABS: 5.0000 mg | ORAL_TABLET | Freq: Every day | ORAL | 3 refills | Status: DC

## 2021-10-10 MED ORDER — HYDROCHLOROTHIAZIDE 25 MG PO TABS: 25.0000 mg | ORAL_TABLET | Freq: Every day | ORAL | 3 refills | Status: DC

## 2021-10-10 NOTE — Patient Instructions (Signed)
Medication Instructions:  Your physician recommends that you continue on your current medications as directed. Please refer to the Current Medication list given to you today.  *If you need a refill on your cardiac medications before your next appointment, please call your pharmacy*   Lab Work:  None ordered  Testing/Procedures:  Your physician has requested that you have an echocardiogram. Echocardiography is a painless test that uses sound waves to create images of your heart. It provides your doctor with information about the size and shape of your heart and how well your heart's chambers and valves are working. This procedure takes approximately one hour. There are no restrictions for this procedure.   Follow-Up: At [facility], you and your health needs are our priority.  As part of our continuing mission to provide you with exceptional heart care, we have created designated Provider Care Teams.  These Care Teams include your primary Cardiologist (physician) and Advanced Practice Providers (APPs -  Physician Assistants and Nurse Practitioners) who all work together to provide you with the care you need, when you need it.  We recommend signing up for the patient portal called "MyChart".  Sign up information is provided on this After Visit Summary.  MyChart is used to connect with patients for Virtual Visits (Telemedicine).  Patients are able to view lab/test results, encounter notes, upcoming appointments, etc.  Non-urgent messages can be sent to your provider as well.   To learn more about what you can do with MyChart, go to [email or web address].    Your next appointment:   6 month(s)  The format for your next appointment:   In Person  Provider:   You may see Nelva Bush, MD or one of the following Advanced Practice Providers on your designated Care Team:    Christell Faith, PA-C   Important Information About Sugar

## 2021-10-11 ENCOUNTER — Other Ambulatory Visit (INDEPENDENT_AMBULATORY_CARE_PROVIDER_SITE_OTHER): Payer: Medicare Other

## 2021-10-11 DIAGNOSIS — I10 Essential (primary) hypertension: Secondary | ICD-10-CM

## 2021-10-11 LAB — BASIC METABOLIC PANEL
BUN: 10 mg/dL (ref 6–23)
CO2: 24 mEq/L (ref 19–32)
Calcium: 9.7 mg/dL (ref 8.4–10.5)
Chloride: 101 mEq/L (ref 96–112)
Creatinine, Ser: 0.87 mg/dL (ref 0.40–1.20)
GFR: 60.34 mL/min (ref >60.00)
Glucose, Bld: 101 mg/dL — ABNORMAL HIGH (ref 70–99)
Potassium: 4.2 mEq/L (ref 3.5–5.1)
Sodium: 134 mEq/L — ABNORMAL LOW (ref 135–145)

## 2021-10-18 DIAGNOSIS — C44219 Basal cell carcinoma of skin of left ear and external auricular canal: Secondary | ICD-10-CM | POA: Diagnosis not present

## 2021-10-31 ENCOUNTER — Encounter: Payer: Self-pay | Admitting: Student in an Organized Health Care Education/Training Program

## 2021-10-31 ENCOUNTER — Ambulatory Visit (INDEPENDENT_AMBULATORY_CARE_PROVIDER_SITE_OTHER): Payer: Medicare Other | Admitting: Student in an Organized Health Care Education/Training Program

## 2021-10-31 VITALS — BP 140/72 | HR 69 | Temp 98.1°F | Ht 65.0 in | Wt 190.6 lb

## 2021-10-31 DIAGNOSIS — R0602 Shortness of breath: Secondary | ICD-10-CM

## 2021-10-31 MED ORDER — FLUTICASONE PROPIONATE 50 MCG/ACT NA SUSP: 1.0000 | Freq: Every day | NASAL | 6 refills | Status: DC

## 2021-10-31 NOTE — Progress Notes (Signed)
Synopsis: Referred in for shortness of breath by Christine Noe, MD  Assessment & Plan:  #Shortness of Breath #Cough   Patient reports shortness of breath associated with cough, concerning for reactive airway disease. I will order PFT's (spirometry, lung volumes, DLCO) for workup and have asked her to continue with [facility] for now. I will consider escalating to triple therapy pending the result from the workup.  For her cough, I suspect an element of post nasal drip triggered by seasonal allergies as well as GERD. I have counseled Christine [name] at length regarding a GERD consistent diet which she will follow. She will also start taking her PRN claritin more regularly to which I have added Flonase to be used daily.  -PFTs (spirometry, lung volumes, DLCO) -Loratadine 10 mg daily -Flonase 50 mcg, 1 spray / nostril daily -Continue Wixela 250/50 1 puff twice daily  Return in about 3 months (around 01/31/2022).  I spent 60 minutes caring for this patient today, including preparing to see the patient, obtaining and/or reviewing separately obtained history, performing a medically appropriate examination and/or evaluation, counseling and educating the patient/family/caregiver, ordering medications, tests, or procedures, and documenting clinical information in the electronic health record  Armando Reichert, MD [facility] 10/31/2021 10:43 AM    End of visit medications:  Current Outpatient Medications:    albuterol (VENTOLIN HFA) 108 (90 Base) MCG/ACT inhaler, Inhale 2 puffs into the lungs every 6 (six) hours as needed for wheezing or shortness of breath., Disp: 8 g, Rfl: 2   amLODipine (NORVASC) 5 MG tablet, Take 1 tablet (5 mg total) by mouth daily., Disp: 90 tablet, Rfl: 3   aspirin EC 81 MG tablet, Take 1 tablet (81 mg total) by mouth daily. Swallow whole., Disp: 90 tablet, Rfl: 3   escitalopram (LEXAPRO) 10 MG tablet, TAKE 1 TABLET DAILY, Disp: 90 tablet, Rfl: 1    fluticasone (FLONASE) 50 MCG/ACT nasal spray, Place 1 spray into both nostrils daily., Disp: 18.2 mL, Rfl: 6   fluticasone-salmeterol (WIXELA INHUB) 250-50 MCG/ACT AEPB, Inhale 1 puff into the lungs in the morning and at bedtime., Disp: , Rfl:    hydrochlorothiazide (HYDRODIURIL) 25 MG tablet, Take 1 tablet (25 mg total) by mouth daily., Disp: 90 tablet, Rfl: 3   levothyroxine (SYNTHROID) 75 MCG tablet, TAKE 1 TABLET DAILY, Disp: 90 tablet, Rfl: 3   loratadine (CLARITIN) 10 MG tablet, Take 10 mg by mouth daily as needed for allergies., Disp: , Rfl:    losartan (COZAAR) 50 MG tablet, Take 1 tablet (50 mg total) by mouth daily., Disp: 90 tablet, Rfl: 3   RABEprazole (ACIPHEX) 20 MG tablet, Take 1 tablet (20 mg total) by mouth daily., Disp: 90 tablet, Rfl: 3   rosuvastatin (CRESTOR) 20 MG tablet, Take 1 tablet (20 mg total) by mouth daily., Disp: 90 tablet, Rfl: 3   Subjective:   PATIENT ID: Christine [name] GENDER: female DOB: November 28, 1933, MRN: [id number]  Chief Complaint  Patient presents with   pulmonary consult    Hx of asthma- SOB with exertion and dry cough. Cough worsens at night.     HPI  Christine [name] is a pleasant 88 year old female presenting to clinic with a chief complaint of shortness of breath as well as cough.  She reports that her symptoms have recently worsened and she was seen by her primary care physician recently and was diagnosed with "COPD". She does endorse having seen a pulmonologist (Dr. Norma Fredrickson) back in 2017 and given a diagnosis of  asthma.  She had been on fluticasone and her primary care physician has recently switched her to fluticasone/salmeterol.  She reports being able to walk from the parking lot all the way to clinic without having to stop to catch her breath.  Yesterday, she did have to stop to catch her breath whith exertion.  She reports a cough that is nonproductive, associated with a scratchy sensation in her throat. She reports that the cough is as  bothersome as her shortness of breath. She reports seasonal allergies (hayfever) that act up in the spring and early fall.  She does not have any fevers or chills, has not had any weight changes, and denies any chest pain.  She reports having had some swelling at her ankles that improves after she was started on a diuretic.  She follows up with her primary care physician as well as with cardiology.  She was born in [place] to live most of her life in [place].  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  She denies any smoking (also denies any secondhand) or any inhalational exposures.  Ancillary information including prior medications, full medical/surgical/family/social histories, and PFTs (when available) are listed below and have been reviewed.   Review of Systems  Constitutional:  Negative for chills, fever and weight loss.  Respiratory:  Positive for cough and shortness of breath. Negative for sputum production.   Cardiovascular:  Negative for chest pain and palpitations.  Gastrointestinal:  Positive for heartburn. Negative for abdominal pain.  Skin:  Negative for rash.     Objective:   Vitals:   10/31/21 1000  BP: (!) 140/72  Pulse: 69  Temp: 98.1 F (36.7 C)  TempSrc: Temporal  SpO2: 97%  Weight: 190 lb 9.6 oz (86.5 kg)  Height: '5\' 5"'$  (1.651 m)   97% on RA BMI Readings from Last 3 Encounters:  10/31/21 31.72 kg/m  10/10/21 31.83 kg/m  09/27/21 32.14 kg/m   Wt Readings from Last 3 Encounters:  10/31/21 190 lb 9.6 oz (86.5 kg)  10/10/21 191 lb 4 oz (86.8 kg)  09/27/21 193 lb 2 oz (87.6 kg)    Physical Exam Constitutional:      General: She is not in acute distress.    Appearance: She is normal weight.  HENT:     Head: Normocephalic.     Nose: Congestion (erythematous turbinates) present.     Mouth/Throat:     Mouth: Mucous membranes are moist.  Eyes:     Pupils: Pupils are equal, round, and reactive to light.   Cardiovascular:     Rate and Rhythm: Normal rate and regular rhythm.     Pulses: Normal pulses.  Pulmonary:     Effort: Pulmonary effort is normal.     Breath sounds: Normal breath sounds.  Abdominal:     General: Abdomen is flat.     Palpations: Abdomen is soft.  Musculoskeletal:        General: Normal range of motion.     Cervical back: Normal range of motion.  Skin:    General: Skin is warm.  Neurological:     General: No focal deficit present.     Mental Status: She is alert. Mental status is at baseline.       Ancillary Information    Past Medical History:  Diagnosis Date   Asthma    Carotid arterial disease ([place])    a. 06/2019 Carotid U/S: RICA 50-69%. Mod LICA plaque.  Chicken pox    Diastolic dysfunction    a. 02/2020 Echo: EF 60-65%, no rwma, GrI DD. Nl RV size/fxn. Triv MR. Mild-mod Ao sclerosis w/o stenois.   Frequent headaches    GERD (gastroesophageal reflux disease)    Heart murmur    a. 02/2020 Echo: Triv MR. Mild-mod Ao sclerosis w/o stenosis.   Hyperlipidemia    Hypertension    Hypothyroidism    Nonobstructive CAD (coronary artery disease)    a.  Remote Cath in [facility] - reportedly nonobs dzs; b. 03/2020 Cor CTA:  Ca2+ = 1844 (94th %'ile). LAD 50p, RCA 25-49 (FFRct nl in prox RCA w/ slow taper in mid-dist segments from 0.8-->0.6).   Rheumatic fever    Urine incontinence      Family History  Problem Relation Age of Onset   Early death Mother    Kidney disease Mother        bright disease   Other Father        tumor on the side of his leg   Heart disease Brother    Heart attack Brother 36   Leukemia Daughter    Heart disease Brother 75   Valvular heart disease Daughter        s/p mitral valve replacement     Past Surgical History:  Procedure Laterality Date   ABDOMINAL HYSTERECTOMY     APPENDECTOMY     BREAST BIOPSY     MASTECTOMY Bilateral 04/1979    Social History   Socioeconomic History   Marital status: Widowed    Spouse name:  Not on file   Number of children: 4   Years of education: college   Highest education level: Not on file  Occupational History   Not on file  Tobacco Use   Smoking status: Never   Smokeless tobacco: Never  Vaping Use   Vaping Use: Never used  Substance and Sexual Activity   Alcohol use: Yes    Comment: maybe once a year   Drug use: Never   Sexual activity: Not Currently  Other Topics Concern   Not on file  Social History Narrative   06/03/19   From: [place] originally, moved here to be near daughter   Living: with daughter - Faythe Dingwall Scientist, clinical (histocompatibility and immunogenetics))   Work: retired from Higher education careers adviser, Education officer, museum      Family: 4 children - Ronni and [place] in [place], daughter in [place] and has adopted daughter in [place]       Enjoys: play tennis, golf, walking, board games, gardening      Exercise: not currently - walking around the house   Diet: not as good as it should be, usually tries to be healthy, avoids fried foods, limits red meat      Safety   Seat belts: Yes    Guns: no   Safe in relationships: Yes    Social Determinants of Radio broadcast assistant Strain: Not on file  Food Insecurity: Not on file  Transportation Needs: Not on file  Physical Activity: Not on file  Stress: Not on file  Social Connections: Not on file  Intimate Partner Violence: Not on file     Allergies  Allergen Reactions   Atorvastatin     Myalgias    Pollen Extract      CBC    Component Value Date/Time   WBC 5.9 09/06/2021 1214   RBC 5.05 09/06/2021 1214   HGB 15.0 09/06/2021 1214   HCT 44.4 09/06/2021 1214   PLT  202.0 09/06/2021 1214   MCV 88.0 09/06/2021 1214   MCHC 33.7 09/06/2021 1214   RDW 13.0 09/06/2021 1214   LYMPHSABS 1.8 09/06/2021 1214   MONOABS 0.5 09/06/2021 1214   EOSABS 0.2 09/06/2021 1214   BASOSABS 0.0 09/06/2021 1214    Pulmonary Functions Testing Results:     No data to display          Outpatient Medications Prior to Visit  Medication Sig Dispense  Refill   albuterol (VENTOLIN HFA) 108 (90 Base) MCG/ACT inhaler Inhale 2 puffs into the lungs every 6 (six) hours as needed for wheezing or shortness of breath. 8 g 2   amLODipine (NORVASC) 5 MG tablet Take 1 tablet (5 mg total) by mouth daily. 90 tablet 3   aspirin EC 81 MG tablet Take 1 tablet (81 mg total) by mouth daily. Swallow whole. 90 tablet 3   escitalopram (LEXAPRO) 10 MG tablet TAKE 1 TABLET DAILY 90 tablet 1   fluticasone-salmeterol (WIXELA INHUB) 250-50 MCG/ACT AEPB Inhale 1 puff into the lungs in the morning and at bedtime.     hydrochlorothiazide (HYDRODIURIL) 25 MG tablet Take 1 tablet (25 mg total) by mouth daily. 90 tablet 3   levothyroxine (SYNTHROID) 75 MCG tablet TAKE 1 TABLET DAILY 90 tablet 3   loratadine (CLARITIN) 10 MG tablet Take 10 mg by mouth daily as needed for allergies.     losartan (COZAAR) 50 MG tablet Take 1 tablet (50 mg total) by mouth daily. 90 tablet 3   RABEprazole (ACIPHEX) 20 MG tablet Take 1 tablet (20 mg total) by mouth daily. 90 tablet 3   rosuvastatin (CRESTOR) 20 MG tablet Take 1 tablet (20 mg total) by mouth daily. 90 tablet 3   No facility-administered medications prior to visit.

## 2021-10-31 NOTE — Patient Instructions (Signed)
Today, we discussed your cough as well as shortness of breath. I have ordered a pulmonary function test to assess your breathing function. I will also prescribe a nasal spray (flonase) to help with allergies. Please also take the claritin that you already have prescribed We did discuss a diet to decrease reflux symptoms.

## 2021-11-01 ENCOUNTER — Ambulatory Visit: Payer: TRICARE For Life (TFL) | Admitting: Physician Assistant

## 2021-11-15 ENCOUNTER — Encounter: Payer: Self-pay | Admitting: Family Medicine

## 2021-11-15 ENCOUNTER — Ambulatory Visit: Payer: TRICARE For Life (TFL) | Admitting: Family Medicine

## 2021-11-15 ENCOUNTER — Ambulatory Visit: Payer: Medicare Other | Attending: Physician Assistant

## 2021-11-15 ENCOUNTER — Ambulatory Visit (INDEPENDENT_AMBULATORY_CARE_PROVIDER_SITE_OTHER): Payer: Medicare Other | Admitting: Family Medicine

## 2021-11-15 VITALS — BP 136/70 | HR 65 | Temp 98.1°F | Wt 192.0 lb

## 2021-11-15 DIAGNOSIS — J4520 Mild intermittent asthma, uncomplicated: Secondary | ICD-10-CM

## 2021-11-15 DIAGNOSIS — I10 Essential (primary) hypertension: Secondary | ICD-10-CM

## 2021-11-15 DIAGNOSIS — R0609 Other forms of dyspnea: Secondary | ICD-10-CM | POA: Diagnosis not present

## 2021-11-15 DIAGNOSIS — F321 Major depressive disorder, single episode, moderate: Secondary | ICD-10-CM | POA: Diagnosis not present

## 2021-11-15 DIAGNOSIS — E785 Hyperlipidemia, unspecified: Secondary | ICD-10-CM | POA: Diagnosis not present

## 2021-11-15 LAB — ECHOCARDIOGRAM COMPLETE
AR max vel: 1.87 cm2
AV Area VTI: 1.95 cm2
AV Area mean vel: 1.81 cm2
AV Mean grad: 3 mmHg
AV Peak grad: 4.9 mmHg
Ao pk vel: 1.11 m/s
Area-P 1/2: 4.04 cm2
S' Lateral: 2.5 cm
Weight: 3072 oz

## 2021-11-15 NOTE — Assessment & Plan Note (Signed)
Cough persists. She has not been adherence to flonase due to forgetfulness. Following with pulm. Cont wixela.

## 2021-11-15 NOTE — Progress Notes (Signed)
Subjective:     Christine [name] is a 88 y.o. female presenting for Hypertension (Follow up), Cough (On going followed by pulmonology), and Weight Gain     HPI  #HTN - taking losartan 50, amlodipine 5, and hctz 25 mg - doing well - no cp  #cough - persistent and intermittent - saw pulmonary - was started on daily allergy pill -   #Weight  - has gained about 20 lbs since last year - has noticed it is mostly around the waist/stomach area - has been trying to follow a healthy diet - no carbonated beverages  - is still drinking sweet tea with lemon - diet - has stopped red meat, has been doing a salad at night - started lexapro about 1 year ago  Depression - not crying as often - feels she is overall doing ok  - daughter struggles with alcohol use disorder  Review of Systems  09/06/2021: Clinic - HTN - increase losartan 100 mg, amlodipine 5 mg. Edema and SOB - non ischemic EKG, CXR normal, normal BNP 09/27/2021: Clinic - HTN - start HCTZ, cont amlodipine and losartan, cards appt. [place] - start advair 10/31/2021: Pulm - pfts, loratidine, flonase, wixela   Social History   Tobacco Use  Smoking Status Never  Smokeless Tobacco Never        Objective:    BP Readings from Last 3 Encounters:  11/15/21 136/70  10/31/21 (!) 140/72  10/10/21 128/60   Wt Readings from Last 3 Encounters:  11/15/21 192 lb (87.1 kg)  10/31/21 190 lb 9.6 oz (86.5 kg)  10/10/21 191 lb 4 oz (86.8 kg)    BP 136/70   Pulse 65   Temp 98.1 F (36.7 C) (Oral)   Wt 192 lb (87.1 kg)   SpO2 97%   BMI 31.95 kg/m    Physical Exam Constitutional:      General: She is not in acute distress.    Appearance: She is well-developed. She is not diaphoretic.  HENT:     Right Ear: External ear normal.     Left Ear: External ear normal.     Nose: Nose normal.  Eyes:     Conjunctiva/sclera: Conjunctivae normal.  Cardiovascular:     Rate and Rhythm: Normal rate and regular rhythm.   Pulmonary:     Effort: Pulmonary effort is normal.     Breath sounds: Normal breath sounds.  Musculoskeletal:     Cervical back: Neck supple.  Skin:    General: Skin is warm and dry.     Capillary Refill: Capillary refill takes less than 2 seconds.  Neurological:     Mental Status: She is alert. Mental status is at baseline.  Psychiatric:        Mood and Affect: Mood normal.        Behavior: Behavior normal.           Assessment & Plan:   Problem List Items Addressed This Visit       Cardiovascular and Mediastinum   Essential hypertension - Primary    Controlled. Will recheck BMP for hyponatremia given recent start of HCTZ. Cont losartan 50 mg, amlodipine 5 mg, and hctz 25 mg.       Relevant Orders   Basic metabolic panel     Respiratory   Mild intermittent asthma without complication    Cough persists. She has not been adherence to flonase due to forgetfulness. Following with pulm. Cont wixela.  Other   Hyperlipidemia LDL goal <70   Relevant Orders   Lipid panel   Current moderate episode of major depressive disorder without prior episode ([place])    Suspect weight gain 2/2 to lexapro. Depression stable. Will try weaning lexapro to 5 mg. If depression returns will send Wellbutrin.         Return for when scheduled.  [name], MD

## 2021-11-15 NOTE — Assessment & Plan Note (Signed)
Controlled. Will recheck BMP for hyponatremia given recent start of HCTZ. Cont losartan 50 mg, amlodipine 5 mg, and hctz 25 mg.

## 2021-11-15 NOTE — Assessment & Plan Note (Signed)
Suspect weight gain 2/2 to lexapro. Depression stable. Will try weaning lexapro to 5 mg. If depression returns will send Wellbutrin.

## 2021-11-15 NOTE — Patient Instructions (Addendum)
Depression/Weight gain - Decrease Lexapro to 5 mg (1/2 tablet) - update if worsening depression - can try Wellbutrin  Blood pressure - labs today - continue medications

## 2021-11-25 ENCOUNTER — Observation Stay (HOSPITAL_COMMUNITY): Admit: 2021-11-25 | Payer: TRICARE For Life (TFL) | Admitting: Surgery

## 2021-11-25 ENCOUNTER — Encounter (HOSPITAL_COMMUNITY): Payer: Self-pay

## 2021-11-25 DIAGNOSIS — Z041 Encounter for examination and observation following transport accident: Secondary | ICD-10-CM | POA: Diagnosis not present

## 2021-11-25 DIAGNOSIS — R109 Unspecified abdominal pain: Secondary | ICD-10-CM | POA: Diagnosis not present

## 2021-11-25 DIAGNOSIS — Z23 Encounter for immunization: Secondary | ICD-10-CM | POA: Diagnosis not present

## 2021-11-25 DIAGNOSIS — M25562 Pain in left knee: Secondary | ICD-10-CM | POA: Diagnosis not present

## 2021-11-25 DIAGNOSIS — R930 Abnormal findings on diagnostic imaging of skull and head, not elsewhere classified: Secondary | ICD-10-CM | POA: Diagnosis not present

## 2021-11-25 DIAGNOSIS — S2220XA Unspecified fracture of sternum, initial encounter for closed fracture: Secondary | ICD-10-CM | POA: Diagnosis not present

## 2021-11-25 DIAGNOSIS — R0602 Shortness of breath: Secondary | ICD-10-CM | POA: Diagnosis not present

## 2021-11-25 DIAGNOSIS — M7989 Other specified soft tissue disorders: Secondary | ICD-10-CM | POA: Diagnosis not present

## 2021-11-25 DIAGNOSIS — I959 Hypotension, unspecified: Secondary | ICD-10-CM | POA: Diagnosis not present

## 2021-11-25 DIAGNOSIS — J329 Chronic sinusitis, unspecified: Secondary | ICD-10-CM | POA: Diagnosis not present

## 2021-11-25 DIAGNOSIS — S81812A Laceration without foreign body, left lower leg, initial encounter: Secondary | ICD-10-CM | POA: Diagnosis not present

## 2021-11-25 DIAGNOSIS — R079 Chest pain, unspecified: Secondary | ICD-10-CM | POA: Diagnosis not present

## 2021-11-25 DIAGNOSIS — S2222XA Fracture of body of sternum, initial encounter for closed fracture: Secondary | ICD-10-CM | POA: Diagnosis not present

## 2021-11-26 DIAGNOSIS — R079 Chest pain, unspecified: Secondary | ICD-10-CM | POA: Diagnosis not present

## 2021-11-26 DIAGNOSIS — R930 Abnormal findings on diagnostic imaging of skull and head, not elsewhere classified: Secondary | ICD-10-CM | POA: Diagnosis not present

## 2021-11-30 ENCOUNTER — Emergency Department: Payer: Medicare Other

## 2021-11-30 ENCOUNTER — Emergency Department
Admission: EM | Admit: 2021-11-30 | Discharge: 2021-11-30 | Disposition: A | Discharge status: Home / Self Care | Payer: Medicare Other | Attending: Emergency Medicine | Admitting: Emergency Medicine

## 2021-11-30 ENCOUNTER — Other Ambulatory Visit: Payer: Self-pay

## 2021-11-30 ENCOUNTER — Ambulatory Visit (INDEPENDENT_AMBULATORY_CARE_PROVIDER_SITE_OTHER): Payer: Medicare Other | Admitting: Family Medicine

## 2021-11-30 VITALS — BP 120/60 | HR 70 | Temp 97.5°F | Wt 186.0 lb

## 2021-11-30 DIAGNOSIS — R0789 Other chest pain: Secondary | ICD-10-CM | POA: Diagnosis not present

## 2021-11-30 DIAGNOSIS — R0602 Shortness of breath: Secondary | ICD-10-CM

## 2021-11-30 DIAGNOSIS — S0990XA Unspecified injury of head, initial encounter: Secondary | ICD-10-CM | POA: Insufficient documentation

## 2021-11-30 DIAGNOSIS — S2222XA Fracture of body of sternum, initial encounter for closed fracture: Secondary | ICD-10-CM | POA: Insufficient documentation

## 2021-11-30 DIAGNOSIS — S0990XD Unspecified injury of head, subsequent encounter: Secondary | ICD-10-CM | POA: Diagnosis not present

## 2021-11-30 DIAGNOSIS — R079 Chest pain, unspecified: Secondary | ICD-10-CM | POA: Diagnosis not present

## 2021-11-30 DIAGNOSIS — R110 Nausea: Secondary | ICD-10-CM | POA: Diagnosis not present

## 2021-11-30 DIAGNOSIS — R42 Dizziness and giddiness: Secondary | ICD-10-CM

## 2021-11-30 DIAGNOSIS — I700 Atherosclerosis of aorta: Secondary | ICD-10-CM | POA: Diagnosis not present

## 2021-11-30 DIAGNOSIS — J984 Other disorders of lung: Secondary | ICD-10-CM | POA: Diagnosis not present

## 2021-11-30 DIAGNOSIS — S2222XD Fracture of body of sternum, subsequent encounter for fracture with routine healing: Secondary | ICD-10-CM | POA: Diagnosis not present

## 2021-11-30 DIAGNOSIS — Z041 Encounter for examination and observation following transport accident: Secondary | ICD-10-CM | POA: Diagnosis not present

## 2021-11-30 LAB — BASIC METABOLIC PANEL
Anion gap: 10 (ref 5–15)
BUN: 18 mg/dL (ref 8–23)
CO2: 26 mmol/L (ref 22–32)
Calcium: 9.5 mg/dL (ref 8.9–10.3)
Chloride: 97 mmol/L — ABNORMAL LOW (ref 98–111)
Creatinine, Ser: 0.85 mg/dL (ref 0.44–1.00)
GFR, Estimated: >60 mL/min (ref >60)
Glucose, Bld: 126 mg/dL — ABNORMAL HIGH (ref 70–99)
Potassium: 3.4 mmol/L — ABNORMAL LOW (ref 3.5–5.1)
Sodium: 133 mmol/L — ABNORMAL LOW (ref 135–145)

## 2021-11-30 LAB — CBC
HCT: 42.6 % (ref 36.0–46.0)
Hemoglobin: 14.7 g/dL (ref 12.0–15.0)
MCH: 30 pg (ref 26.0–34.0)
MCHC: 34.5 g/dL (ref 30.0–36.0)
MCV: 86.9 fL (ref 80.0–100.0)
Platelets: 196 10*3/uL (ref 150–400)
RBC: 4.9 MIL/uL (ref 3.87–5.11)
RDW: 12.9 % (ref 11.5–15.5)
WBC: 9.3 10*3/uL (ref 4.0–10.5)
nRBC: 0 % (ref 0.0–0.2)

## 2021-11-30 LAB — TROPONIN I (HIGH SENSITIVITY): Troponin I (High Sensitivity): 5 ng/L (ref <18)

## 2021-11-30 LAB — URINALYSIS, MICROSCOPIC (REFLEX): Bacteria, UA: NONE SEEN

## 2021-11-30 LAB — URINALYSIS, ROUTINE W REFLEX MICROSCOPIC
Bilirubin Urine: NEGATIVE
Glucose, UA: NEGATIVE mg/dL
Ketones, ur: NEGATIVE mg/dL
Nitrite: NEGATIVE
Protein, ur: NEGATIVE mg/dL
Specific Gravity, Urine: 1.01 (ref 1.005–1.030)
pH: 7 (ref 5.0–8.0)

## 2021-11-30 MED ORDER — MORPHINE SULFATE (PF) 2 MG/ML IV SOLN
2.0000 mg | Freq: Once | INTRAVENOUS | Status: AC
  Administered 2021-11-30: 2 mg via INTRAVENOUS
  Filled 2021-11-30: qty 1

## 2021-11-30 MED ORDER — HYDROCODONE-ACETAMINOPHEN 5-325 MG PO TABS: 1.0000 | ORAL_TABLET | Freq: Four times a day (QID) | ORAL | 0 refills | Status: DC | PRN

## 2021-11-30 MED ORDER — SODIUM CHLORIDE 0.9 % IV BOLUS
500.0000 mL | Freq: Once | INTRAVENOUS | Status: AC
  Administered 2021-11-30: 500 mL via INTRAVENOUS

## 2021-11-30 NOTE — Discharge Instructions (Signed)
Please continue to use your incentive spirometer. Please be seen for any fevers, worsening shortness of breath, productive cough or any other new or concerning symptoms.
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
## 2021-11-30 NOTE — Assessment & Plan Note (Signed)
Per patient report. Severe pain on the left side with deep breath. Given dyspnea and severity of pain advise ER evaluation. Lung sounds diminished bases.
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
## 2021-12-05 ENCOUNTER — Ambulatory Visit (INDEPENDENT_AMBULATORY_CARE_PROVIDER_SITE_OTHER): Payer: Medicare Other | Admitting: Family Medicine

## 2021-12-05 VITALS — BP 120/80 | HR 67 | Temp 97.7°F | Wt 185.0 lb

## 2021-12-05 DIAGNOSIS — Z0000 Encounter for general adult medical examination without abnormal findings: Secondary | ICD-10-CM | POA: Diagnosis not present

## 2021-12-05 DIAGNOSIS — S2222XD Fracture of body of sternum, subsequent encounter for fracture with routine healing: Secondary | ICD-10-CM | POA: Diagnosis not present

## 2021-12-05 MED ORDER — ONDANSETRON HCL 4 MG PO TABS: 4.0000 mg | ORAL_TABLET | Freq: Three times a day (TID) | ORAL | 0 refills | Status: DC | PRN

## 2021-12-05 MED ORDER — HYDROCODONE-ACETAMINOPHEN 5-325 MG PO TABS: 1.0000 | ORAL_TABLET | Freq: Four times a day (QID) | ORAL | 0 refills | Status: DC | PRN

## 2021-12-05 NOTE — Patient Instructions (Addendum)
   Would recommend the following for Bone:   1) 800 units of Vitamin D daily 2) Get 1200 mg of elemental calcium --- this is best from your diet. Try to track how much calcium you get on a typical day. You could find ways to add more (dairy products, leafy greens). Take a supplement for whatever you don't typically get so you reach 1200 mg of calcium.  3) Physical activity (ideally weight bearing) - like walking briskly 30 minutes 5 days a week.   Sternum Fracture - continue medication  - Start Tylenol 500-650 mg every 6 hours - Hydrocodone as needed 1-2 times per day - see me next week  - zofran as needed to help with nausea
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
## 2021-12-05 NOTE — Assessment & Plan Note (Addendum)
ER last week reviewed - stable XR and dizziness improved with fluids. Improving but pain is persisting. Refill medication. Suspect nausea may be 2/2 to pain as well as pain medication. zofran prn discussed sedation risk. Return next week if pain is still severe. Start scheduled tylenol 500 mg TID.

## 2021-12-12 ENCOUNTER — Telehealth: Payer: Self-pay | Admitting: *Deleted

## 2021-12-12 NOTE — Telephone Encounter (Signed)
     Patient  visit on 11/30/2021  at [facility] was for chest pain   Have you been able to follow up with your primary care physician? Will see dr tomorrow and she has all her meds  The patient was able to obtain any needed medicine or equipment.  Are there diet recommendations that you are having difficulty following?  Patient expresses understanding of discharge instructions and education provided has no other needs at this time.    [place] [facility] [phone] [address] , [place] [address] Email : [email or web address]

## 2021-12-13 ENCOUNTER — Encounter: Payer: Self-pay | Admitting: Family

## 2021-12-13 ENCOUNTER — Ambulatory Visit (INDEPENDENT_AMBULATORY_CARE_PROVIDER_SITE_OTHER): Payer: Medicare Other | Admitting: Family

## 2021-12-13 VITALS — BP 122/64 | HR 73 | Temp 98.6°F | Resp 16 | Ht 65.0 in | Wt 186.0 lb

## 2021-12-13 DIAGNOSIS — E871 Hypo-osmolality and hyponatremia: Secondary | ICD-10-CM

## 2021-12-13 DIAGNOSIS — E876 Hypokalemia: Secondary | ICD-10-CM | POA: Insufficient documentation

## 2021-12-13 DIAGNOSIS — S2222XD Fracture of body of sternum, subsequent encounter for fracture with routine healing: Secondary | ICD-10-CM

## 2021-12-13 DIAGNOSIS — L03116 Cellulitis of left lower limb: Secondary | ICD-10-CM

## 2021-12-13 LAB — BASIC METABOLIC PANEL
BUN: 14 mg/dL (ref 6–23)
CO2: 27 mEq/L (ref 19–32)
Calcium: 10.3 mg/dL (ref 8.4–10.5)
Chloride: 96 mEq/L (ref 96–112)
Creatinine, Ser: 0.99 mg/dL (ref 0.40–1.20)
GFR: 51.61 mL/min — ABNORMAL LOW (ref >60.00)
Glucose, Bld: 106 mg/dL — ABNORMAL HIGH (ref 70–99)
Potassium: 4.5 mEq/L (ref 3.5–5.1)
Sodium: 134 mEq/L — ABNORMAL LOW (ref 135–145)

## 2021-12-13 MED ORDER — CEPHALEXIN 500 MG PO CAPS: 500.0000 mg | ORAL_CAPSULE | Freq: Three times a day (TID) | ORAL | 0 refills | Status: AC
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
## 2021-12-13 NOTE — Assessment & Plan Note (Signed)
Continue daily incentive spirometry Continue with tylenol 500 mg tid prn pain Continue with pain medication prn pain Continue with use of pillow with exhalation movements  If pain worsens please seek urgent care.  Any increasing sob or doe go to er and or call 911.

## 2021-12-13 NOTE — Patient Instructions (Signed)
  Stop by the lab prior to leaving today. I will notify you of your results once received.    Regards,   Shamari Trostel FNP-C  

## 2021-12-13 NOTE — Assessment & Plan Note (Signed)
Repeat cmp pending results 

## 2021-12-13 NOTE — Assessment & Plan Note (Signed)
rx cephalexin 300 tid x 10 days , pt advised of following: Please monitor site for worsening signs/symptoms of infection to include: increasing redness, increasing tenderness, increase in size, and or pustulant drainage from site. If this is to occur please let me know immediately.

## 2021-12-22 ENCOUNTER — Ambulatory Visit (INDEPENDENT_AMBULATORY_CARE_PROVIDER_SITE_OTHER)
Admission: RE | Admit: 2021-12-22 | Discharge: 2021-12-22 | Disposition: A | Discharge status: Home / Self Care | Payer: Medicare Other | Source: Ambulatory Visit | Attending: Family | Admitting: Family

## 2021-12-22 ENCOUNTER — Ambulatory Visit (INDEPENDENT_AMBULATORY_CARE_PROVIDER_SITE_OTHER): Payer: Medicare Other | Admitting: Family

## 2021-12-22 ENCOUNTER — Encounter: Payer: Self-pay | Admitting: Family

## 2021-12-22 VITALS — BP 118/74 | HR 63 | Temp 97.5°F | Ht 65.0 in | Wt 183.6 lb

## 2021-12-22 DIAGNOSIS — S2222XD Fracture of body of sternum, subsequent encounter for fracture with routine healing: Secondary | ICD-10-CM | POA: Diagnosis not present

## 2021-12-22 DIAGNOSIS — R071 Chest pain on breathing: Secondary | ICD-10-CM

## 2021-12-22 DIAGNOSIS — R0781 Pleurodynia: Secondary | ICD-10-CM
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
## 2021-12-26 DIAGNOSIS — R0781 Pleurodynia: Secondary | ICD-10-CM | POA: Insufficient documentation

## 2021-12-26 NOTE — Assessment & Plan Note (Addendum)
Xray chest pending results  For pain will stat with tylenol codeine 3  pdmp reviewed no suspicious findings.  Also recommended pt watch total amount of tylenol as tylenol codeine with this as well. Lidocaine patch 5% prn Also can utilize otc voltaren gel

## 2021-12-26 NOTE — Assessment & Plan Note (Signed)
Xray left rib pending results

## 2021-12-30 ENCOUNTER — Telehealth: Payer: Self-pay | Admitting: Family

## 2021-12-30 DIAGNOSIS — R0781 Pleurodynia: Secondary | ICD-10-CM

## 2021-12-30 MED ORDER — ACETAMINOPHEN-CODEINE 300-30 MG PO TABS: 1.0000 | ORAL_TABLET | Freq: Three times a day (TID) | ORAL | 0 refills | Status: AC | PRN

## 2021-12-30 NOTE — Telephone Encounter (Signed)
Pt notified as instructed and voiced understanding. Pt was appreciative of med being set to pharmacy and the call. Nothing further needed at this time.

## 2021-12-30 NOTE — Telephone Encounter (Signed)
I reviewed Christine [name]'s office notes from 12/22/2021 and it appears that she was wanting to start the patient on Tylenol with codeine No. 3.  I will send a prescription to [facility] pharmacy now.  She can take 1 tablet by mouth every 8 hours as needed for pain.

## 2021-12-30 NOTE — Telephone Encounter (Signed)
Patient called in to ask about pain medication that was suppose to be sent in for her at her appointment on 12/22/21. She has checked with the pharmacy several times,but they do not have the a rx for the medication.

## 2022-01-06 ENCOUNTER — Encounter: Payer: Self-pay | Admitting: Family Medicine

## 2022-01-06 ENCOUNTER — Ambulatory Visit (INDEPENDENT_AMBULATORY_CARE_PROVIDER_SITE_OTHER): Payer: Medicare Other | Admitting: Family Medicine

## 2022-01-06 VITALS — BP 100/60 | HR 74 | Temp 97.8°F | Ht 65.0 in | Wt 188.0 lb

## 2022-01-06 DIAGNOSIS — I6523 Occlusion and stenosis of bilateral carotid arteries: Secondary | ICD-10-CM | POA: Diagnosis not present

## 2022-01-06 DIAGNOSIS — G44319 Acute post-traumatic headache, not intractable: Secondary | ICD-10-CM | POA: Diagnosis not present
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
## 2022-01-06 NOTE — Patient Instructions (Signed)
Work on brain rest and advance as able.  Start tylenol ES 2 capsule twice daily.   Keep up with water intake.  Rest.   GO to ER of severe headache worsening or and neurologic changes.
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
## 2022-01-25 ENCOUNTER — Ambulatory Visit: Payer: TRICARE For Life (TFL) | Admitting: Student in an Organized Health Care Education/Training Program

## 2022-02-07 ENCOUNTER — Ambulatory Visit: Payer: Medicare Other | Attending: Student in an Organized Health Care Education/Training Program

## 2022-02-07 DIAGNOSIS — R0602 Shortness of breath: Secondary | ICD-10-CM | POA: Diagnosis not present

## 2022-02-07 LAB — PULMONARY FUNCTION TEST ARMC ONLY
DL/VA % pred: 79 %
DL/VA: 3.2 ml/min/mmHg/L
DLCO unc % pred: 84 %
DLCO unc: 16.1 ml/min/mmHg
FEF 25-75 Post: 1.78 L/sec
FEF 25-75 Pre: 1.47 L/sec
FEF2575-%Change-Post: 20 %
FEF2575-%Pred-Post: 154 %
FEF2575-%Pred-Pre: 127 %
FEV1-%Change-Post: 4 %
FEV1-%Pred-Post: 112 %
FEV1-%Pred-Pre: 107 %
FEV1-Post: 2.05 L
FEV1-Pre: 1.95 L
FEV1FVC-%Change-Post: 0 %
FEV1FVC-%Pred-Pre: 100 %
FEV6-%Change-Post: 2 %
FEV6-%Pred-Post: 118 %
FEV6-%Pred-Pre: 115 %
FEV6-Post: 2.74 L
FEV6-Pre: 2.67 L
FEV6FVC-%Change-Post: -1 %
FEV6FVC-%Pred-Post: 104 %
FEV6FVC-%Pred-Pre: 106 %
FVC-%Change-Post: 4 %
FVC-%Pred-Post: 113 %
FVC-%Pred-Pre: 108 %
FVC-Post: 2.79 L
FVC-Pre: 2.68 L
Post FEV1/FVC ratio: 73 %
Post FEV6/FVC ratio: 98 %
Pre FEV1/FVC ratio: 73 %
Pre FEV6/FVC Ratio: 100 %
RV % pred: 96 %
RV: 2.46 L
TLC % pred: 103 %
TLC: 5.37 L

## 2022-02-10 ENCOUNTER — Ambulatory Visit (INDEPENDENT_AMBULATORY_CARE_PROVIDER_SITE_OTHER): Payer: Medicare Other | Admitting: Nurse Practitioner

## 2022-02-10 ENCOUNTER — Encounter: Payer: Self-pay | Admitting: Nurse Practitioner

## 2022-02-10 VITALS — BP 120/74 | HR 68 | Temp 97.7°F | Resp 16 | Ht 65.0 in | Wt 185.2 lb

## 2022-02-10 DIAGNOSIS — E039 Hypothyroidism, unspecified: Secondary | ICD-10-CM

## 2022-02-10 DIAGNOSIS — J4520 Mild intermittent asthma, uncomplicated: Secondary | ICD-10-CM

## 2022-02-10 DIAGNOSIS — I10 Essential (primary) hypertension: Secondary | ICD-10-CM | POA: Diagnosis not present

## 2022-02-10 DIAGNOSIS — K219 Gastro-esophageal reflux disease without esophagitis: Secondary | ICD-10-CM

## 2022-02-10 DIAGNOSIS — I6521 Occlusion and stenosis of right carotid artery: Secondary | ICD-10-CM

## 2022-02-10 NOTE — Assessment & Plan Note (Signed)
Patient currently maintained on amlodipine, losartan, hydrochlorothiazide.  Blood pressure within normal limits.  Patient does have a blood pressure cuff at home if she needs to utilize it.  Continue taking medication as prescribed.

## 2022-02-10 NOTE — Assessment & Plan Note (Signed)
Patient planning, Wixela maintenance inhaler and albuterol as needed.  She is followed by pulmonology recent PFTs performed approximately 3 days ago.  Patient does have a follow-up coming in 3 days to review PFTs with pulmonologist.  Keep appointment as recommended and take medication as prescribed.
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
## 2022-02-10 NOTE — Assessment & Plan Note (Signed)
Patient currently maintained on Aciphex.  Symptoms seem to be well-controlled continue

## 2022-02-10 NOTE — Patient Instructions (Signed)
Nice to see you today I want to see you in 3 months, sooner if you need me. We will do lab work at that visit

## 2022-02-10 NOTE — Assessment & Plan Note (Addendum)
Did review bilateral ultrasound carotids done in 2021.  Right side 50 to 69% stenosed left side some buildup but no blood flow abnormality.  Patient currently asymptomatic.  Patient states she was evaluated by Dr. Saunders Revel in the past

## 2022-02-10 NOTE — Assessment & Plan Note (Addendum)
Patient currently maintained on levothyroxine.  Tolerates medication well.  Last TSH within normal limits.  Continue levothyroxine

## 2022-02-13 ENCOUNTER — Encounter: Payer: Self-pay | Admitting: Student in an Organized Health Care Education/Training Program

## 2022-02-13 ENCOUNTER — Ambulatory Visit (INDEPENDENT_AMBULATORY_CARE_PROVIDER_SITE_OTHER): Payer: Medicare Other | Admitting: Student in an Organized Health Care Education/Training Program

## 2022-02-13 VITALS — BP 130/70 | HR 78 | Temp 98.3°F | Ht 65.0 in | Wt 184.6 lb

## 2022-02-13 DIAGNOSIS — R0602 Shortness of breath: Secondary | ICD-10-CM | POA: Diagnosis not present

## 2022-02-13 DIAGNOSIS — R059 Cough, unspecified: Secondary | ICD-10-CM

## 2022-02-13 DIAGNOSIS — J4520 Mild intermittent asthma, uncomplicated: Secondary | ICD-10-CM | POA: Diagnosis not present

## 2022-02-13 NOTE — Progress Notes (Signed)
Synopsis: Follow up for cough  Assessment & Plan:   #Shortness of Breath #Cough  #Mild Intermittent Asthma   Patient reports shortness of breath associated with cough that has improved significantly since started on Wixela. Her PFT's (spirometry, lung volumes, and DLCO) were fully normal. Overall, the constellation of symptoms is consistent with a reactive airway disease, most consistent with mild intermittent asthma. I have asked her to continue her [place].   For her cough, I suspect an element of post nasal drip triggered by seasonal allergies as well as GERD. During our previous visit, I counseled Christine [name] on GERD consistent diet as well as started claritin and Flonase with significant improvement in her symptoms. She will continue her current regimen.  -Continue Loratadine 10 mg daily -Continue Flonase 50 mcg, 1 spray / nostril daily -Continue Wixela 250/50 1 puff twice daily  Return in about 6 months (around 08/15/2022).  I spent 30 minutes caring for this patient today, including preparing to see the patient, obtaining a medical history , reviewing a separately obtained history, performing a medically appropriate examination and/or evaluation, counseling and educating the patient/family/caregiver, documenting clinical information in the electronic health record, and independently interpreting results (not separately reported/billed) and communicating results to the patient/family/caregiver  Armando Reichert, MD [facility] 02/13/2022 11:40 AM    End of visit medications:  No orders of the defined types were placed in this encounter.    Current Outpatient Medications:    albuterol (VENTOLIN HFA) 108 (90 Base) MCG/ACT inhaler, Inhale 2 puffs into the lungs every 6 (six) hours as needed for wheezing or shortness of breath., Disp: 8 g, Rfl: 2   amLODipine (NORVASC) 5 MG tablet, Take 1 tablet (5 mg total) by mouth daily., Disp: 90 tablet, Rfl: 3   aspirin  EC 81 MG tablet, Take 1 tablet (81 mg total) by mouth daily. Swallow whole., Disp: 90 tablet, Rfl: 3   fluticasone (FLONASE) 50 MCG/ACT nasal spray, Place 1 spray into both nostrils daily., Disp: 18.2 mL, Rfl: 6   fluticasone-salmeterol (WIXELA INHUB) 250-50 MCG/ACT AEPB, Inhale 1 puff into the lungs in the morning and at bedtime., Disp: , Rfl:    hydrochlorothiazide (HYDRODIURIL) 25 MG tablet, Take 1 tablet (25 mg total) by mouth daily., Disp: 90 tablet, Rfl: 3   levothyroxine (SYNTHROID) 75 MCG tablet, TAKE 1 TABLET DAILY, Disp: 90 tablet, Rfl: 3   loratadine (CLARITIN) 10 MG tablet, Take 10 mg by mouth daily as needed for allergies., Disp: , Rfl:    losartan (COZAAR) 50 MG tablet, Take 1 tablet (50 mg total) by mouth daily., Disp: 90 tablet, Rfl: 3   RABEprazole (ACIPHEX) 20 MG tablet, Take 1 tablet (20 mg total) by mouth daily., Disp: 90 tablet, Rfl: 3   rosuvastatin (CRESTOR) 20 MG tablet, Take 1 tablet (20 mg total) by mouth daily., Disp: 90 tablet, Rfl: 3   Subjective:   PATIENT ID: Christine [name] GENDER: female DOB: 01-13-1934, MRN: [id number]  Chief Complaint  Patient presents with   Follow-up    Review PFT-- SOB with exertion.     HPI  Christine [name] is a pleasant 88 year old female presenting to clinic for follow up on shortness of breath and cough.  She was last seen in our office in August of 2023. At that point, her chief complaint was mostly that of cough and shortness of breath. She had carried a diagnosis of asthma in the past (2017 in [place]) and was told she could have COPD.  Her main symptom was cough that is now significantly improved. She also suffered from shortness of breath which is also improved today on follow up. She had been started on Wixela during a prior visit which I have asked her to continue pending PFT's. Today, she reports feeling bothered and coughing when she was exposed to second hand smoke. This lasted a few minutes and self resolved.  She reports  seasonal allergies (hayfever) that act up in the spring and early fall.  She does not have any fevers or chills, has not had any weight changes, and denies any chest pain.  She reports having had some swelling at her ankles that improves after she was started on a diuretic.  She follows up with her primary care physician as well as with cardiology.   She was born in [place] to live most of her life in [place].  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  She denies any smoking (also denies any secondhand) or any inhalational exposures.  Ancillary information including prior medications, full medical/surgical/family/social histories, and PFTs (when available) are listed below and have been reviewed.   Review of Systems  Constitutional:  Negative for chills, fever and weight loss.  Respiratory:  Negative for sputum production.   Cardiovascular:  Negative for chest pain and palpitations.  Gastrointestinal:  Positive for heartburn. Negative for abdominal pain.  Skin:  Negative for rash.     Objective:   Vitals:   02/13/22 1129  BP: 130/70  Pulse: 78  Temp: 98.3 F (36.8 C)  TempSrc: Temporal  SpO2: 94%  Weight: 184 lb 9.6 oz (83.7 kg)  Height: '5\' 5"'$  (1.651 m)   94% on RA  BMI Readings from Last 3 Encounters:  02/13/22 30.72 kg/m  02/10/22 30.83 kg/m  01/06/22 31.28 kg/m   Wt Readings from Last 3 Encounters:  02/13/22 184 lb 9.6 oz (83.7 kg)  02/10/22 185 lb 4 oz (84 kg)  01/06/22 188 lb (85.3 kg)    Physical Exam Constitutional:      General: She is not in acute distress.    Appearance: She is normal weight.  HENT:     Head: Normocephalic.     Mouth/Throat:     Mouth: Mucous membranes are moist.  Eyes:     Pupils: Pupils are equal, round, and reactive to light.  Cardiovascular:     Rate and Rhythm: Normal rate and regular rhythm.     Pulses: Normal pulses.  Pulmonary:     Effort: Pulmonary effort is normal.     Breath  sounds: Normal breath sounds.  Abdominal:     General: Abdomen is flat.     Palpations: Abdomen is soft.  Musculoskeletal:        General: Normal range of motion.     Cervical back: Normal range of motion.  Skin:    General: Skin is warm.  Neurological:     General: No focal deficit present.     Mental Status: She is alert. Mental status is at baseline.     Ancillary Information    Past Medical History:  Diagnosis Date   Asthma    Carotid arterial disease ([place])    a. 06/2019 Carotid U/S: RICA 50-69%. Mod LICA plaque.   Chicken pox    Diastolic dysfunction    a. 02/2020 Echo: EF 60-65%, no rwma, GrI DD. Nl RV size/fxn. Triv MR. Mild-mod Ao sclerosis w/o stenois.   Frequent headaches  GERD (gastroesophageal reflux disease)    Heart murmur    a. 02/2020 Echo: Triv MR. Mild-mod Ao sclerosis w/o stenosis.   Hyperlipidemia    Hypertension    Hypothyroidism    Nonobstructive CAD (coronary artery disease)    a.  Remote Cath in [facility] - reportedly nonobs dzs; b. 03/2020 Cor CTA:  Ca2+ = 1844 (94th %'ile). LAD 50p, RCA 25-49 (FFRct nl in prox RCA w/ slow taper in mid-dist segments from 0.8-->0.6).   Rheumatic fever    Urine incontinence      Family History  Problem Relation Age of Onset   Early death Mother 19   Kidney disease Mother        bright disease   Other Father        tumor on the side of his leg   Heart disease Brother    Heart attack Brother 87   Heart disease Brother 42   Leukemia Daughter    Valvular heart disease Daughter        s/p mitral valve replacement     Past Surgical History:  Procedure Laterality Date   ABDOMINAL HYSTERECTOMY     APPENDECTOMY     BREAST BIOPSY     MASTECTOMY Bilateral 04/1979    Social History   Socioeconomic History   Marital status: Widowed    Spouse name: Not on file   Number of children: 4   Years of education: college   Highest education level: Not on file  Occupational History   Not on file  Tobacco Use   Smoking  status: Never   Smokeless tobacco: Never  Vaping Use   Vaping Use: Never used  Substance and Sexual Activity   Alcohol use: Yes    Comment: maybe once a year   Drug use: Never   Sexual activity: Not Currently  Other Topics Concern   Not on file  Social History Narrative   06/03/19   From: [place] originally, moved here to be near daughter   Living: with daughter - Faythe Dingwall Scientist, clinical (histocompatibility and immunogenetics))   Work: retired from Higher education careers adviser, Education officer, museum      Family: 4 children - Ronni and Elaine in [place], daughter in [place] and has adopted daughter in [place]       Enjoys: play tennis, golf, walking, board games, gardening      Exercise: not currently - walking around the house   Diet: not as good as it should be, usually tries to be healthy, avoids fried foods, limits red meat      Safety   Seat belts: Yes    Guns: no   Safe in relationships: Yes    Social Determinants of Radio broadcast assistant Strain: Not on file  Food Insecurity: Not on file  Transportation Needs: Not on file  Physical Activity: Not on file  Stress: Not on file  Social Connections: Not on file  Intimate Partner Violence: Not on file     Allergies  Allergen Reactions   Atorvastatin     Myalgias    Pollen Extract      CBC    Component Value Date/Time   WBC 9.3 11/30/2021 1328   RBC 4.90 11/30/2021 1328   HGB 14.7 11/30/2021 1328   HCT 42.6 11/30/2021 1328   PLT 196 11/30/2021 1328   MCV 86.9 11/30/2021 1328   MCH 30.0 11/30/2021 1328   MCHC 34.5 11/30/2021 1328   RDW 12.9 11/30/2021 1328   LYMPHSABS 1.8 09/06/2021 1214   MONOABS  0.5 09/06/2021 1214   EOSABS 0.2 09/06/2021 1214   BASOSABS 0.0 09/06/2021 1214    Pulmonary Functions Testing Results:    Latest Ref Rng & Units 02/07/2022    3:42 PM  PFT Results  FVC-Pre L 2.68   FVC-Predicted Pre % 108   FVC-Post L 2.79   FVC-Predicted Post % 113   Pre FEV1/FVC % % 73   Post FEV1/FCV % % 73   FEV1-Pre L 1.95   FEV1-Predicted Pre % 107    FEV1-Post L 2.05   DLCO uncorrected ml/min/mmHg 16.10   DLCO UNC% % 84   DLVA Predicted % 79   TLC L 5.37   TLC % Predicted % 103   RV % Predicted % 96     Outpatient Medications Prior to Visit  Medication Sig Dispense Refill   albuterol (VENTOLIN HFA) 108 (90 Base) MCG/ACT inhaler Inhale 2 puffs into the lungs every 6 (six) hours as needed for wheezing or shortness of breath. 8 g 2   amLODipine (NORVASC) 5 MG tablet Take 1 tablet (5 mg total) by mouth daily. 90 tablet 3   aspirin EC 81 MG tablet Take 1 tablet (81 mg total) by mouth daily. Swallow whole. 90 tablet 3   fluticasone (FLONASE) 50 MCG/ACT nasal spray Place 1 spray into both nostrils daily. 18.2 mL 6   fluticasone-salmeterol (WIXELA INHUB) 250-50 MCG/ACT AEPB Inhale 1 puff into the lungs in the morning and at bedtime.     hydrochlorothiazide (HYDRODIURIL) 25 MG tablet Take 1 tablet (25 mg total) by mouth daily. 90 tablet 3   levothyroxine (SYNTHROID) 75 MCG tablet TAKE 1 TABLET DAILY 90 tablet 3   loratadine (CLARITIN) 10 MG tablet Take 10 mg by mouth daily as needed for allergies.     losartan (COZAAR) 50 MG tablet Take 1 tablet (50 mg total) by mouth daily. 90 tablet 3   RABEprazole (ACIPHEX) 20 MG tablet Take 1 tablet (20 mg total) by mouth daily. 90 tablet 3   rosuvastatin (CRESTOR) 20 MG tablet Take 1 tablet (20 mg total) by mouth daily. 90 tablet 3   No facility-administered medications prior to visit.

## 2022-02-16 DIAGNOSIS — D225 Melanocytic nevi of trunk: Secondary | ICD-10-CM | POA: Diagnosis not present

## 2022-02-16 DIAGNOSIS — L821 Other seborrheic keratosis: Secondary | ICD-10-CM | POA: Diagnosis not present

## 2022-02-16 DIAGNOSIS — L570 Actinic keratosis: Secondary | ICD-10-CM | POA: Diagnosis not present

## 2022-02-16 DIAGNOSIS — X32XXXA Exposure to sunlight, initial encounter: Secondary | ICD-10-CM | POA: Diagnosis not present

## 2022-02-16 DIAGNOSIS — Z08 Encounter for follow-up examination after completed treatment for malignant neoplasm: Secondary | ICD-10-CM | POA: Diagnosis not present

## 2022-02-16 DIAGNOSIS — Z85828 Personal history of other malignant neoplasm of skin: Secondary | ICD-10-CM | POA: Diagnosis not present

## 2022-03-20 ENCOUNTER — Ambulatory Visit (INDEPENDENT_AMBULATORY_CARE_PROVIDER_SITE_OTHER)
Admission: RE | Admit: 2022-03-20 | Discharge: 2022-03-20 | Disposition: A | Discharge status: Home / Self Care | Payer: Medicare Other | Source: Ambulatory Visit | Attending: Nurse Practitioner | Admitting: Nurse Practitioner

## 2022-03-20 ENCOUNTER — Ambulatory Visit (INDEPENDENT_AMBULATORY_CARE_PROVIDER_SITE_OTHER): Payer: Medicare Other | Admitting: Nurse Practitioner

## 2022-03-20 ENCOUNTER — Encounter: Payer: Self-pay | Admitting: Nurse Practitioner

## 2022-03-20 VITALS — BP 118/72 | HR 63 | Ht 65.0 in | Wt 183.0 lb

## 2022-03-20 DIAGNOSIS — M546 Pain in thoracic spine: Secondary | ICD-10-CM

## 2022-03-20 DIAGNOSIS — M6289 Other specified disorders of muscle: Secondary | ICD-10-CM | POA: Diagnosis not present

## 2022-03-20 DIAGNOSIS — M47814 Spondylosis without myelopathy or radiculopathy, thoracic region: Secondary | ICD-10-CM | POA: Diagnosis not present

## 2022-03-20 MED ORDER — TIZANIDINE HCL 2 MG PO CAPS: 2.0000 mg | ORAL_CAPSULE | Freq: Every evening | ORAL | 0 refills | Status: DC | PRN
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
## 2022-03-20 NOTE — Assessment & Plan Note (Signed)
Will write tizanidine 2 mg nightly as needed.  Sedation precautions reviewed.

## 2022-03-20 NOTE — Patient Instructions (Signed)
Nice to see you today I will be in touch with the xray results once I have them The muscle relaxer can cause sedation so use caution Follow up if no improvement

## 2022-04-05 ENCOUNTER — Other Ambulatory Visit: Payer: Self-pay

## 2022-04-05 ENCOUNTER — Telehealth: Payer: Self-pay | Admitting: Pharmacy Technician

## 2022-04-05 NOTE — Telephone Encounter (Signed)
Plan prefers Tizanidine Tablets:

## 2022-04-05 NOTE — Telephone Encounter (Signed)
Patient has already picked the script up

## 2022-04-05 NOTE — Telephone Encounter (Signed)
Last 03/20/2022 Next 04/06/2022

## 2022-04-06 ENCOUNTER — Ambulatory Visit: Payer: TRICARE For Life (TFL) | Admitting: Nurse Practitioner

## 2022-04-06 ENCOUNTER — Encounter: Payer: Self-pay | Admitting: Nurse Practitioner

## 2022-04-06 VITALS — BP 124/52 | HR 77 | Ht 65.0 in | Wt 179.0 lb

## 2022-04-06 DIAGNOSIS — J40 Bronchitis, not specified as acute or chronic: Secondary | ICD-10-CM

## 2022-04-06 DIAGNOSIS — R0609 Other forms of dyspnea: Secondary | ICD-10-CM

## 2022-04-06 DIAGNOSIS — R051 Acute cough: Secondary | ICD-10-CM

## 2022-04-06 DIAGNOSIS — G4489 Other headache syndrome: Secondary | ICD-10-CM | POA: Diagnosis not present

## 2022-04-06 DIAGNOSIS — R053 Chronic cough: Secondary | ICD-10-CM | POA: Insufficient documentation

## 2022-04-06 LAB — POC SOFIA SARS ANTIGEN FIA: SARS Coronavirus 2 Ag: NEGATIVE

## 2022-04-06 MED ORDER — DOXYCYCLINE HYCLATE 100 MG PO TABS: 100.0000 mg | ORAL_TABLET | Freq: Two times a day (BID) | ORAL | 0 refills | Status: AC

## 2022-04-06 NOTE — Patient Instructions (Addendum)
Nice to see you today The Covid test was negative in office I am going to treat you with an antibiotic.  Rest as much as you can and drink plenty of fluid. If the [place] is helping you can continue to take it If you do not improve let me know Follow up as scheduled with me

## 2022-04-06 NOTE — Assessment & Plan Note (Signed)
Baseline per patient she is unsure if it is worse.  Patient does have albuterol inhaler she can use as needed.  No wheezing auscultated in office lungs clear to auscultation.  Defer steroid use at this juncture

## 2022-04-06 NOTE — Assessment & Plan Note (Signed)
COVID test negative in office will treat with doxycycline 100 mg twice daily for 7 days.  Follow-up if no improvement she can continue using over-the-counter medications as beneficial for symptom relief

## 2022-04-06 NOTE — Progress Notes (Signed)
Acute Office Visit  Subjective:     Patient ID: Christine [name], female    DOB: [birth year], 89 y.o.   MRN: [id number]  Chief Complaint  Patient presents with   Cough    X2-3w, productive, denies increased shob/fever, family also sick   Nasal Congestion   Ear Pain    Right>Left, hurts when coughs/blows nose     Patient is in today for sick symptoms with a history of HTN, GERD, hypothyroidism, MDD,  Symptoms have been going on for approx 2 plus weeks. States that it was the cough Her daughter is sick at home Flu vaccine is utd Covid vaccine: moderna x2 Has used a [place], alka selzer cough and cold. Unsure if it has helped  Review of Systems  Constitutional:  Positive for fever (on sunday) and malaise/fatigue. Negative for chills.  HENT:  Positive for ear pain. Negative for ear discharge, sinus pain and sore throat.   Respiratory:  Positive for cough, sputum production (yellow and thick) and shortness of breath (doe).   Musculoskeletal:  Negative for joint pain and myalgias.  Neurological:  Positive for headaches.        Objective:    BP (!) 124/52   Pulse 77   Ht '5\' 5"'$  (1.651 m)   Wt 179 lb (81.2 kg)   SpO2 96%   BMI 29.79 kg/m    Physical Exam Vitals and nursing note reviewed.  Constitutional:      Appearance: Normal appearance.  HENT:     Right Ear: Tympanic membrane, ear canal and external ear normal.     Left Ear: Ear canal and external ear normal. There is impacted cerumen.     Nose:     Right Sinus: No maxillary sinus tenderness or frontal sinus tenderness.     Left Sinus: No maxillary sinus tenderness or frontal sinus tenderness.     Mouth/Throat:     Mouth: Mucous membranes are moist.     Pharynx: Oropharynx is clear.  Cardiovascular:     Rate and Rhythm: Normal rate and regular rhythm.     Heart sounds: Normal heart sounds.  Pulmonary:     Effort: Pulmonary effort is normal.     Breath sounds: Normal breath sounds.  Lymphadenopathy:      Cervical: No cervical adenopathy.  Neurological:     Mental Status: She is alert.     Results for orders placed or performed in visit on 04/06/22  POC SOFIA Antigen FIA  Result Value Ref Range   SARS Coronavirus 2 Ag Negative Negative        Assessment & Plan:   Problem List Items Addressed This Visit       Respiratory   Bronchitis    COVID test negative in office will treat with doxycycline 100 mg twice daily for 7 days.  Follow-up if no improvement she can continue using over-the-counter medications as beneficial for symptom relief      Relevant Medications   doxycycline (VIBRA-TABS) 100 MG tablet     Other   DOE (dyspnea on exertion)    Baseline per patient she is unsure if it is worse.  Patient does have albuterol inhaler she can use as needed.  No wheezing auscultated in office lungs clear to auscultation.  Defer steroid use at this juncture      Acute cough - Primary    COVID test negative in office continues an over-the-counter medications as helpful for symptomatic relief      Relevant  Orders   POC SOFIA Antigen FIA (Completed)   Other headache syndrome    COVID test negative in office.  Continue over-the-counter analgesics as needed.  Rest and drink plenty of fluid.       Meds ordered this encounter  Medications   doxycycline (VIBRA-TABS) 100 MG tablet    Sig: Take 1 tablet (100 mg total) by mouth 2 (two) times daily for 7 days.    Dispense:  14 tablet    Refill:  0    Order Specific Question:   Supervising Provider    Answer:   Loura Pardon A [[id number]    Return if symptoms worsen or fail to improve, for As scheduled.  Romilda Garret, NP

## 2022-04-06 NOTE — Assessment & Plan Note (Signed)
COVID test negative in office continues an over-the-counter medications as helpful for symptomatic relief

## 2022-04-06 NOTE — Assessment & Plan Note (Signed)
COVID test negative in office.  Continue over-the-counter analgesics as needed.  Rest and drink plenty of fluid.

## 2022-04-07 MED ORDER — LOSARTAN POTASSIUM 50 MG PO TABS: 50.0000 mg | ORAL_TABLET | Freq: Every day | ORAL | 3 refills | Status: DC

## 2022-04-07 NOTE — Telephone Encounter (Signed)
Refill sent in

## 2022-04-19 ENCOUNTER — Ambulatory Visit: Payer: Medicare Other | Attending: Physician Assistant | Admitting: Physician Assistant

## 2022-04-19 ENCOUNTER — Encounter: Payer: Self-pay | Admitting: Physician Assistant

## 2022-04-19 VITALS — BP 140/68 | HR 65 | Ht 65.0 in | Wt 177.6 lb

## 2022-04-19 DIAGNOSIS — I6523 Occlusion and stenosis of bilateral carotid arteries: Secondary | ICD-10-CM | POA: Insufficient documentation

## 2022-04-19 DIAGNOSIS — I2510 Atherosclerotic heart disease of native coronary artery without angina pectoris: Secondary | ICD-10-CM | POA: Diagnosis not present

## 2022-04-19 DIAGNOSIS — I10 Essential (primary) hypertension: Secondary | ICD-10-CM | POA: Insufficient documentation

## 2022-04-19 DIAGNOSIS — E785 Hyperlipidemia, unspecified: Secondary | ICD-10-CM | POA: Insufficient documentation
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
## 2022-04-19 NOTE — Patient Instructions (Signed)
Medication Instructions:  No changes at this time.   *If you need a refill on your cardiac medications before your next appointment, please call your pharmacy*   Lab Work: None  If you have labs (blood work) drawn today and your tests are completely normal, you will receive your results only by: [place] (if you have MyChart) OR A paper copy in the mail If you have any lab test that is abnormal or we need to change your treatment, we will call you to review the results.   Testing/Procedures: Your physician has requested that you have a carotid duplex. This test is an ultrasound of the carotid arteries in your neck. It looks at blood flow through these arteries that supply the brain with blood. Allow one hour for this exam. There are no restrictions or special instructions.    Follow-Up: At [facility], you and your health needs are our priority.  As part of our continuing mission to provide you with exceptional heart care, we have created designated Provider Care Teams.  These Care Teams include your primary Cardiologist (physician) and Advanced Practice Providers (APPs -  Physician Assistants and Nurse Practitioners) who all work together to provide you with the care you need, when you need it.   Your next appointment:   6 month(s)  Provider:   Christell Faith, PA-C  Only

## 2022-04-21 ENCOUNTER — Other Ambulatory Visit: Payer: Self-pay

## 2022-04-21 DIAGNOSIS — E039 Hypothyroidism, unspecified: Secondary | ICD-10-CM

## 2022-04-21 MED ORDER — LEVOTHYROXINE SODIUM 75 MCG PO TABS: 75.0000 ug | ORAL_TABLET | Freq: Every day | ORAL | 3 refills | Status: DC

## 2022-04-21 NOTE — Telephone Encounter (Signed)
Received refill request for L- Thyroxine 75 mcg. Was given by Dr. Einar Pheasant in the past.  Last TSH  Lab Results  Component Value Date   TSH 1.26 09/06/2021   Last refill 07/15/21 #90 3 rf

## 2022-05-01 ENCOUNTER — Telehealth: Payer: Self-pay | Admitting: Nurse Practitioner

## 2022-05-01 ENCOUNTER — Other Ambulatory Visit: Payer: Self-pay

## 2022-05-01 DIAGNOSIS — M6289 Other specified disorders of muscle: Secondary | ICD-10-CM

## 2022-05-01 NOTE — Telephone Encounter (Signed)
Rx sent to provider

## 2022-05-01 NOTE — Telephone Encounter (Signed)
Last visit 04/06/2022 Next 05/12/2022

## 2022-05-01 NOTE — Telephone Encounter (Signed)
Prescription Request  05/01/2022  Is this a "Controlled Substance" medicine? No  LOV: 04/06/2022  What is the name of the medication or equipment?  tizanidine (ZANAFLEX) 2 MG capsule   Have you contacted your pharmacy to request a refill? No   Which pharmacy would you like this sent to?  [facility] [place], [address] [place] [address] Phone: [phone] Fax: [phone]   Patient notified that their request is being sent to the clinical staff for review and that they should receive a response within 2 business days.   Please advise at Mobile [phone] (mobile)

## 2022-05-02 NOTE — Telephone Encounter (Signed)
Left message to return call to our office.  

## 2022-05-02 NOTE — Telephone Encounter (Signed)
Called pt and said she does need it.

## 2022-05-03 MED ORDER — TIZANIDINE HCL 2 MG PO CAPS: 2.0000 mg | ORAL_CAPSULE | Freq: Every evening | ORAL | 0 refills | Status: DC | PRN

## 2022-05-04 ENCOUNTER — Other Ambulatory Visit: Payer: Self-pay

## 2022-05-04 DIAGNOSIS — M6289 Other specified disorders of muscle: Secondary | ICD-10-CM

## 2022-05-04 MED ORDER — TIZANIDINE HCL 2 MG PO CAPS: 2.0000 mg | ORAL_CAPSULE | Freq: Every evening | ORAL | 0 refills | Status: DC | PRN

## 2022-05-04 NOTE — Telephone Encounter (Signed)
Pt called stating the meds, tizanidine (ZANAFLEX) 2 MG capsule [facility]. Pt stated she told our office to send meds to [facility]. Pt states now she'll have to wait to get meds since its being sent through mail order. Call back # [phone]

## 2022-05-04 NOTE — Telephone Encounter (Signed)
Spoke to pt and informed her that Rx was resent to [facility]

## 2022-05-11 ENCOUNTER — Other Ambulatory Visit (HOSPITAL_COMMUNITY): Payer: Self-pay

## 2022-05-12 ENCOUNTER — Ambulatory Visit (INDEPENDENT_AMBULATORY_CARE_PROVIDER_SITE_OTHER): Payer: Medicare Other | Admitting: Nurse Practitioner

## 2022-05-12 ENCOUNTER — Encounter: Payer: Self-pay | Admitting: Nurse Practitioner

## 2022-05-12 VITALS — BP 128/60 | HR 69 | Temp 97.4°F | Resp 16 | Ht 65.0 in | Wt 176.4 lb

## 2022-05-12 DIAGNOSIS — H6122 Impacted cerumen, left ear: Secondary | ICD-10-CM

## 2022-05-12 DIAGNOSIS — H938X2 Other specified disorders of left ear: Secondary | ICD-10-CM | POA: Diagnosis not present

## 2022-05-12 DIAGNOSIS — M542 Cervicalgia: Secondary | ICD-10-CM

## 2022-05-12 DIAGNOSIS — J4520 Mild intermittent asthma, uncomplicated: Secondary | ICD-10-CM

## 2022-05-12 DIAGNOSIS — I10 Essential (primary) hypertension: Secondary | ICD-10-CM

## 2022-05-12 LAB — COMPREHENSIVE METABOLIC PANEL
ALT: 19 U/L (ref 0–35)
AST: 22 U/L (ref 0–37)
Albumin: 4.3 g/dL (ref 3.5–5.2)
Alkaline Phosphatase: 67 U/L (ref 39–117)
BUN: 17 mg/dL (ref 6–23)
CO2: 30 mEq/L (ref 19–32)
Calcium: 10.3 mg/dL (ref 8.4–10.5)
Chloride: 100 mEq/L (ref 96–112)
Creatinine, Ser: 0.85 mg/dL (ref 0.40–1.20)
GFR: 61.8 mL/min (ref >60.00)
Glucose, Bld: 102 mg/dL — ABNORMAL HIGH (ref 70–99)
Potassium: 4 mEq/L (ref 3.5–5.1)
Sodium: 138 mEq/L (ref 135–145)
Total Bilirubin: 0.8 mg/dL (ref 0.2–1.2)
Total Protein: 7.2 g/dL (ref 6.0–8.3)

## 2022-05-12 LAB — CBC
HCT: 45 % (ref 36.0–46.0)
Hemoglobin: 15.3 g/dL — ABNORMAL HIGH (ref 12.0–15.0)
MCHC: 34 g/dL (ref 30.0–36.0)
MCV: 88 fl (ref 78.0–100.0)
Platelets: 212 10*3/uL (ref 150.0–400.0)
RBC: 5.11 Mil/uL (ref 3.87–5.11)
RDW: 13.6 % (ref 11.5–15.5)
WBC: 5.6 10*3/uL (ref 4.0–10.5)

## 2022-05-12 NOTE — Assessment & Plan Note (Signed)
Patient's blood pressure within normal limits today.  She is currently maintained on amlodipine and losartan.  Patient has the ability to check blood pressure at home she does infrequently as she has been stable.  Continue medications as prescribed

## 2022-05-12 NOTE — Progress Notes (Signed)
Established Patient Office Visit  Subjective   Patient ID: Christine [name], female    DOB: [birth year]  Age: 89 y.o. MRN: [id number]  Chief Complaint  Patient presents with   Neck Pain   Medical Management of Chronic Issues      HTN: Patient does have the ability to check blood pressure at home. States that she does check her blood pressure infrequently maybe once a week.  Asthma: Patient currently on [place] and followed by Dr. Genia Harold at [facility] pulmonology in [place].   Neck pain: states that the neck has improved some. She has used tizanidine for her shoulders. Has been using volatren, biofreeze, and other topical treatments with some relief. States that she is having difficulty sleeping, nothing new. States that she has tried the hot shower and heat pad. States that she did not have an injury.   Ear fullness: states that it is her left ear. States that it has been almost a week. States it started Saturday. States that hard to hear and feels stopped up. States that she has used over the counter ear drops then tried to clean her ear out. States that she has used a curette and ear wax drops without relief     Review of Systems  Constitutional:  Negative for chills and fever.  HENT:  Negative for ear discharge and ear pain (ear fullness).   Respiratory:  Negative for shortness of breath.   Cardiovascular:  Negative for chest pain.  Musculoskeletal:  Positive for neck pain.  Neurological:  Negative for headaches.  Psychiatric/Behavioral:  Negative for hallucinations and suicidal ideas.       Objective:     BP 128/60   Pulse 69   Temp (!) 97.4 F (36.3 C)   Resp 16   Ht '5\' 5"'$  (1.651 m)   Wt 176 lb 6 oz (80 kg)   SpO2 98%   BMI 29.35 kg/m    Physical Exam Vitals and nursing note reviewed.  Constitutional:      Appearance: Normal appearance.  HENT:     Right Ear: Tympanic membrane, ear canal and external ear normal.     Left Ear: Ear canal and external ear  normal. There is impacted cerumen.     Mouth/Throat:     Mouth: Mucous membranes are moist.     Pharynx: Oropharynx is clear.  Cardiovascular:     Rate and Rhythm: Normal rate and regular rhythm.     Heart sounds: Normal heart sounds.  Pulmonary:     Effort: Pulmonary effort is normal.     Breath sounds: Normal breath sounds.  Musculoskeletal:        General: Tenderness present.       Arms:  Lymphadenopathy:     Cervical: No cervical adenopathy.  Neurological:     Mental Status: She is alert.      No results found for any visits on 05/12/22.    The ASCVD Risk score (Arnett DK, et al., 2019) failed to calculate for the following reasons:   The 2019 ASCVD risk score is only valid for ages 100 to 34    Assessment & Plan:   Problem List Items Addressed This Visit       Cardiovascular and Mediastinum   Essential hypertension - Primary    Patient's blood pressure within normal limits today.  She is currently maintained on amlodipine and losartan.  Patient has the ability to check blood pressure at home she does infrequently as she has been  stable.  Continue medications as prescribed      Relevant Orders   CBC   Comprehensive metabolic panel     Respiratory   Mild intermittent asthma without complication    Patient is doing well with her Wixela.  She is followed by pulmonology.  Continue taking medication as prescribed and follow-up with pulmonologist as recommended        Nervous and Auditory   Sensation of fullness in left ear    Verbal consent obtained.  Patient was prepped per office policy.  Mixture of water and hydroperoxide was used and left ear was irrigated.  Patient tolerated procedure well      Impacted cerumen of left ear    Verbal consent obtained.  Patient was prepped per office policy a mixture of water and hydroperoxide was used.  Patient tolerated procedure well.  Impaction was dislodged.  TM within normal limits.      Relevant Orders   Ear Lavage      Other   Neck pain    Secondary to muscle tightness/tenderness.  Patient has a prescription for tizanidine that we found that she can pick up today.  She has tried that medication in the past and has been beneficial.  She continue using over-the-counter treatments if beneficial along with heat.  Follow-up if no improvement       Return in about 6 months (around 11/12/2022) for recheck of chronic conditions.    Romilda Garret, NP

## 2022-05-12 NOTE — Assessment & Plan Note (Signed)
Patient is doing well with her Wixela.  She is followed by pulmonology.  Continue taking medication as prescribed and follow-up with pulmonologist as recommended

## 2022-05-12 NOTE — Patient Instructions (Signed)
Nice to see you. Pick up the tizanidine muscle relaxer see if that is beneficial.  You can continue using over-the-counter treatments for your neck inclusive of heat if beneficial. I will be in touch with the labs once I have results. Follow-up with me in 6 months, sooner if needed.

## 2022-05-12 NOTE — Assessment & Plan Note (Signed)
Verbal consent obtained.  Patient was prepped per office policy a mixture of water and hydroperoxide was used.  Patient tolerated procedure well.  Impaction was dislodged.  TM within normal limits.

## 2022-05-12 NOTE — Assessment & Plan Note (Signed)
Verbal consent obtained.  Patient was prepped per office policy.  Mixture of water and hydroperoxide was used and left ear was irrigated.  Patient tolerated procedure well

## 2022-05-12 NOTE — Assessment & Plan Note (Signed)
Secondary to muscle tightness/tenderness.  Patient has a prescription for tizanidine that we found that she can pick up today.  She has tried that medication in the past and has been beneficial.  She continue using over-the-counter treatments if beneficial along with heat.  Follow-up if no improvement

## 2022-05-29 ENCOUNTER — Other Ambulatory Visit: Payer: Self-pay | Admitting: Physician Assistant

## 2022-05-29 DIAGNOSIS — I6523 Occlusion and stenosis of bilateral carotid arteries: Secondary | ICD-10-CM

## 2022-06-05 ENCOUNTER — Other Ambulatory Visit: Payer: Self-pay

## 2022-06-05 ENCOUNTER — Telehealth: Payer: Self-pay | Admitting: Nurse Practitioner

## 2022-06-05 DIAGNOSIS — E039 Hypothyroidism, unspecified: Secondary | ICD-10-CM

## 2022-06-05 NOTE — Telephone Encounter (Signed)
Called mail order pharmacy and they said it would take 7 days to receive. Pt will be out Wednesday and pt need a short supply sent to local pharmacy.

## 2022-06-05 NOTE — Telephone Encounter (Signed)
Prescription Request  06/05/2022  LOV: 05/12/2022  What is the name of the medication or equipment?  levothyroxine (SYNTHROID) 75 MCG tablet  Have you contacted your pharmacy to request a refill? Yes   Which pharmacy would you like this sent to?  [place], [place] [address] Phone: [phone] Fax: [phone]    Patient notified that their request is being sent to the clinical staff for review and that they should receive a response within 2 business days.   Please advise at [facility] [phone]

## 2022-06-06 ENCOUNTER — Ambulatory Visit: Payer: Medicare Other | Attending: Diagnostic Radiology

## 2022-06-06 DIAGNOSIS — I6523 Occlusion and stenosis of bilateral carotid arteries: Secondary | ICD-10-CM | POA: Diagnosis not present

## 2022-06-06 MED ORDER — LEVOTHYROXINE SODIUM 75 MCG PO TABS: 75.0000 ug | ORAL_TABLET | Freq: Every day | ORAL | 0 refills | Status: DC

## 2022-06-06 NOTE — Telephone Encounter (Signed)
Rx sent to pharmacy   

## 2022-06-13 IMAGING — DX DG CHEST 2V
2 series · 2 of 2 positions shown · non-contrast
Comparison: None.

CLINICAL DATA: Dyspnea on exertion

EXAM:
CHEST - 2 VIEW

[chest pa]
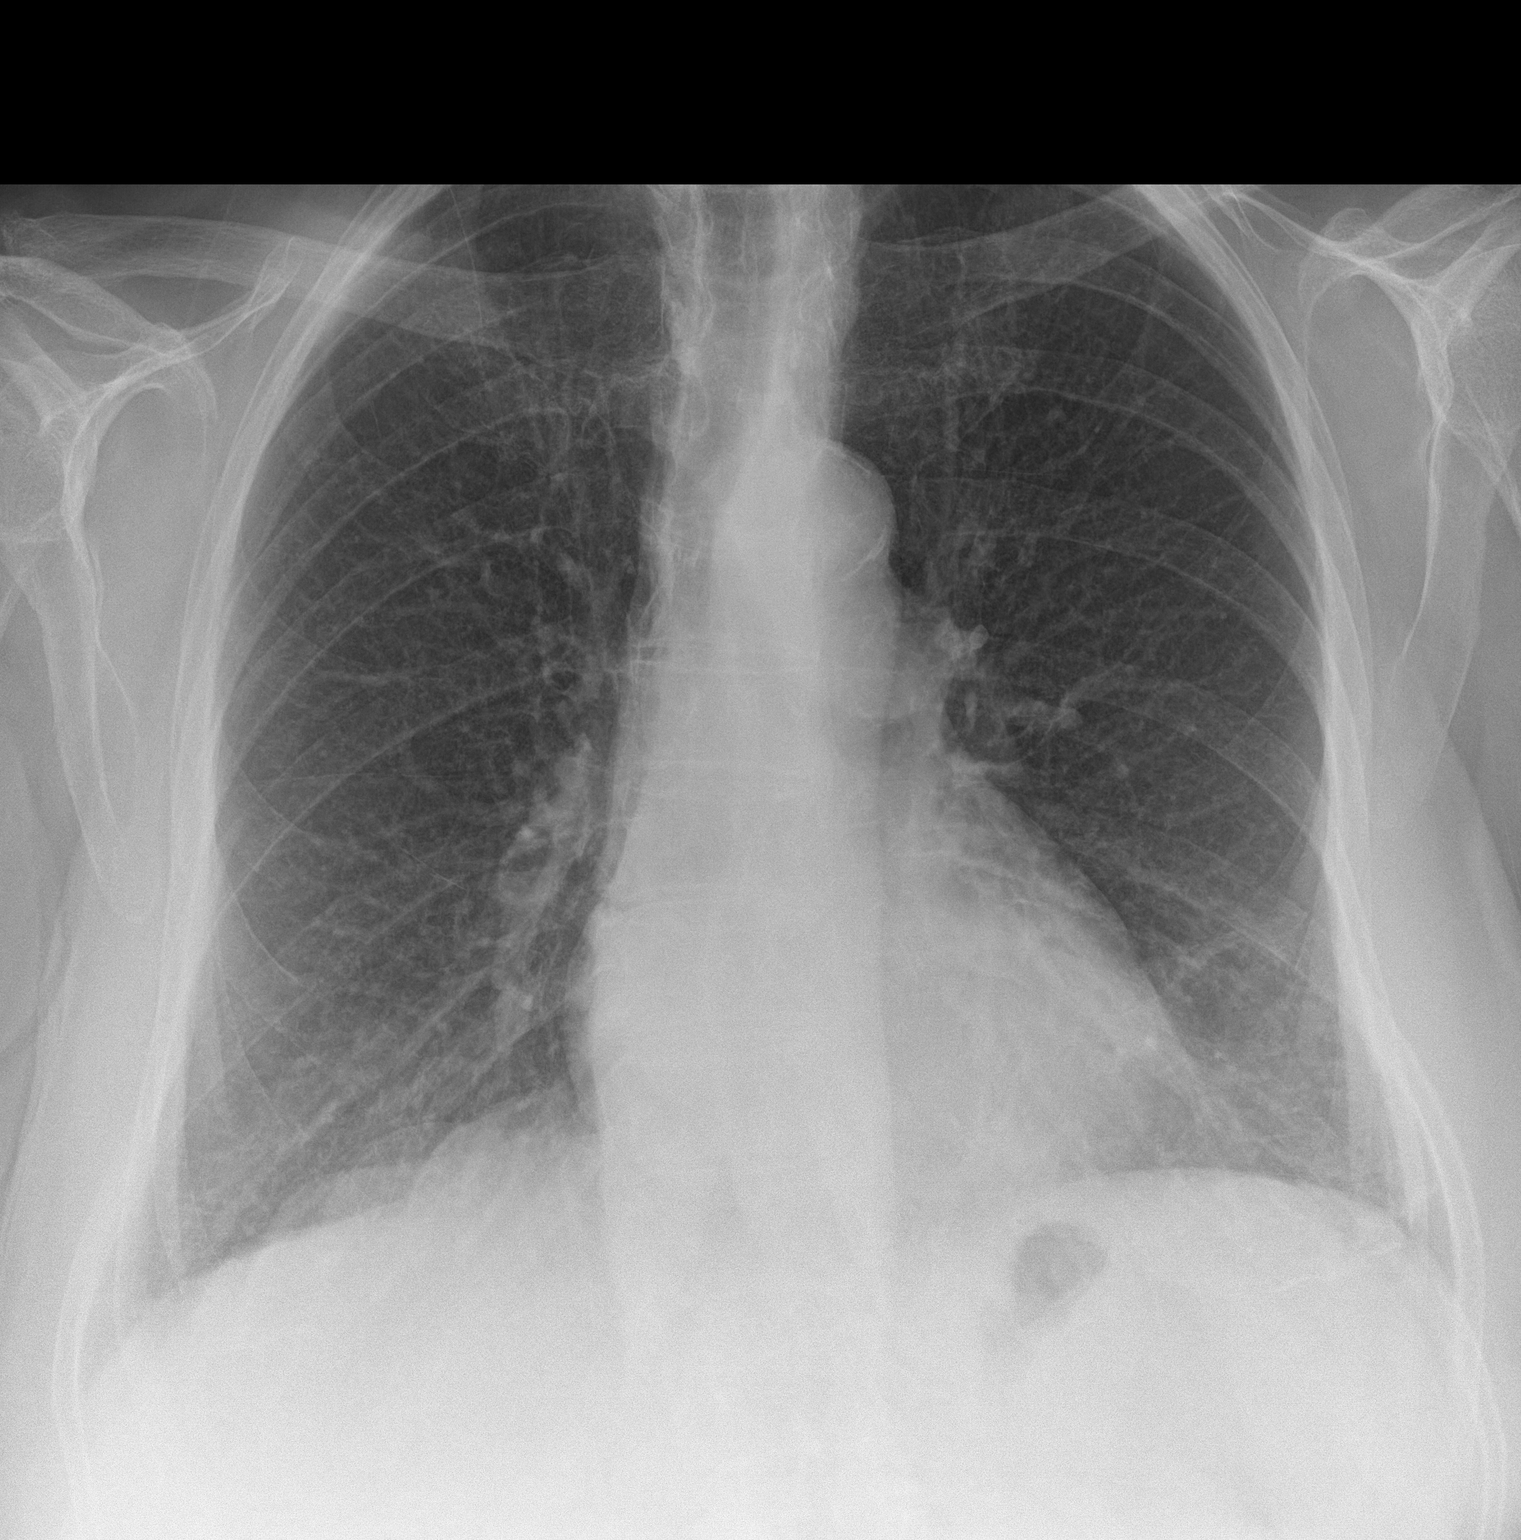

[chest lat]
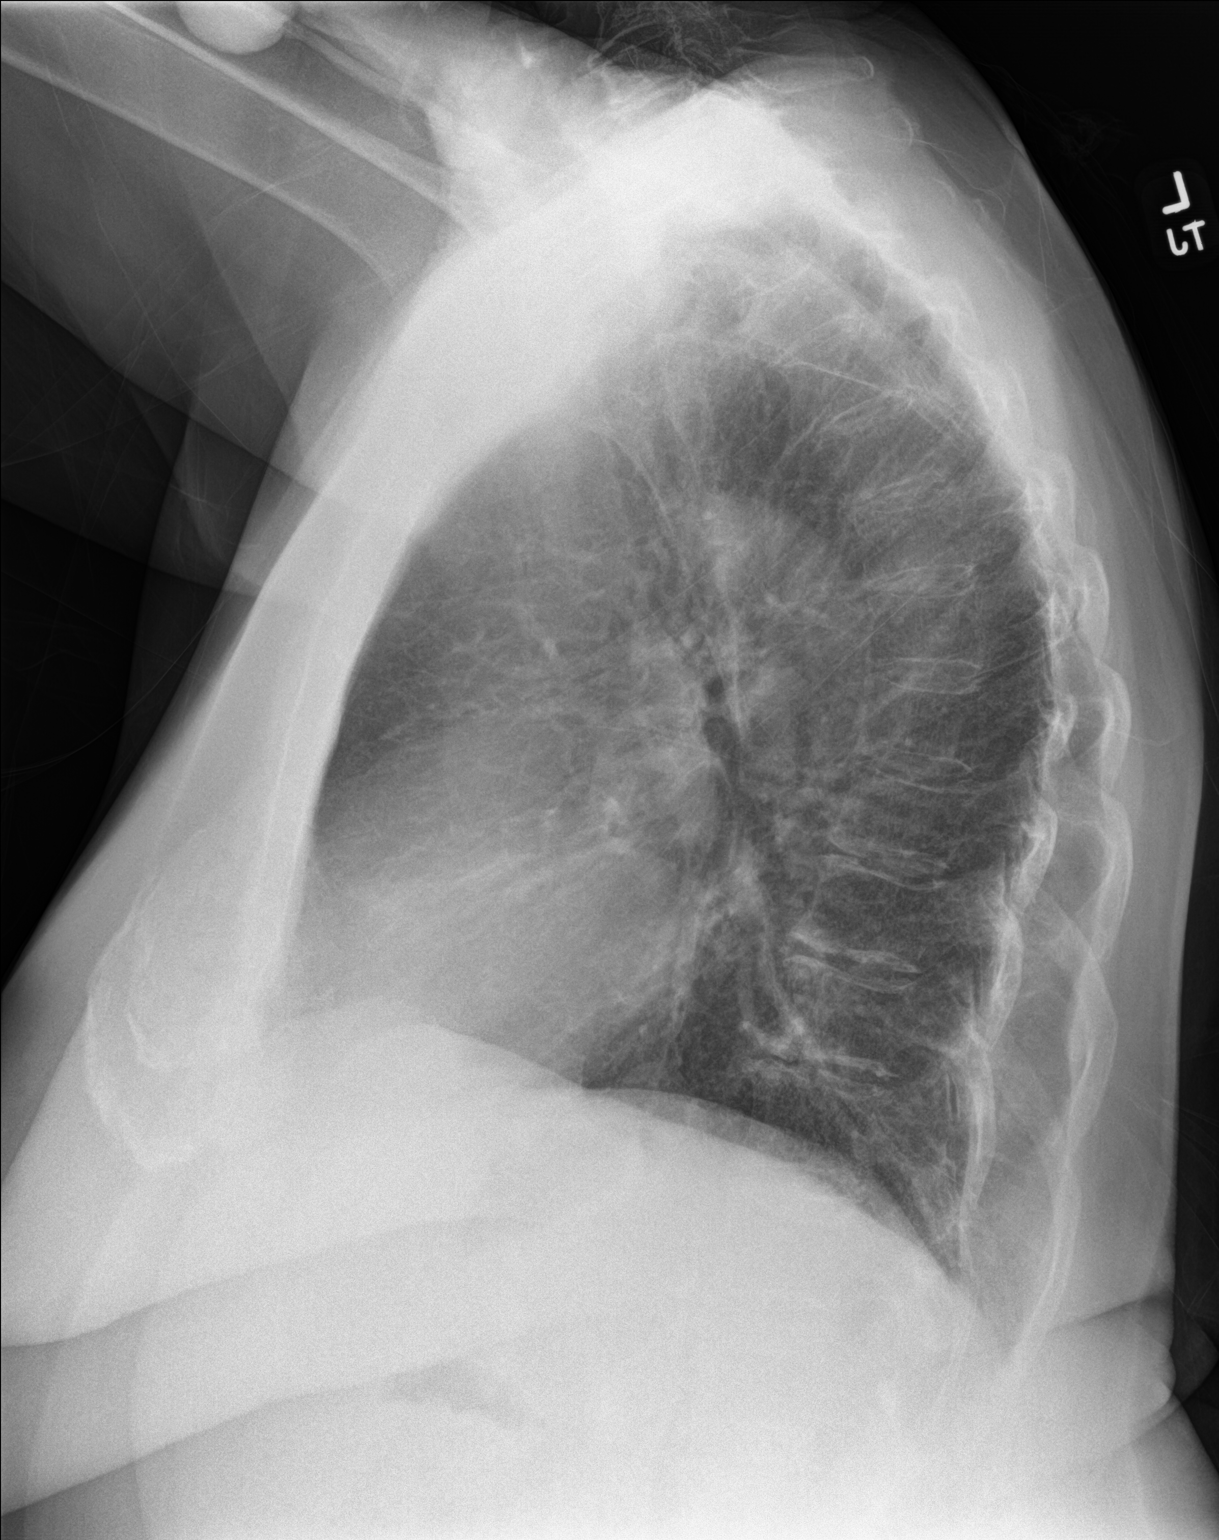

[2 of 2 positions shown; findings below may reference images not displayed]

FINDINGS: The heart size and mediastinal contours are within normal limits.
Both lungs are clear. The visualized skeletal structures are
unremarkable.
IMPRESSION: No active cardiopulmonary disease.

## 2022-07-03 DIAGNOSIS — M170 Bilateral primary osteoarthritis of knee: Secondary | ICD-10-CM | POA: Diagnosis not present

## 2022-07-03 DIAGNOSIS — G8929 Other chronic pain: Secondary | ICD-10-CM | POA: Diagnosis not present

## 2022-07-10 DIAGNOSIS — M170 Bilateral primary osteoarthritis of knee: Secondary | ICD-10-CM | POA: Diagnosis not present

## 2022-08-08 IMAGING — CT CT HEART MORP W/ CTA COR W/ SCORE W/ CA W/CM &/OR W/O CM
1 of 14 series · 3 of 20 positions shown, 4 images · non-contrast
Comparison: None

Addendum:
CLINICAL DATA: Chestpain and dyspnea on exertion

EXAM:
Cardiac/Coronary  CTA
TECHNIQUE: The patient was scanned on a Siemens Somatoform go.Top scanner.

[Series 44: ms multiphase cta coronary 0.60 · axial · 0.34mm/px · z∈[-1090,-1029]mm · 3 of 2745 slices shown, 4 images]
[im 687/2745  vessel]
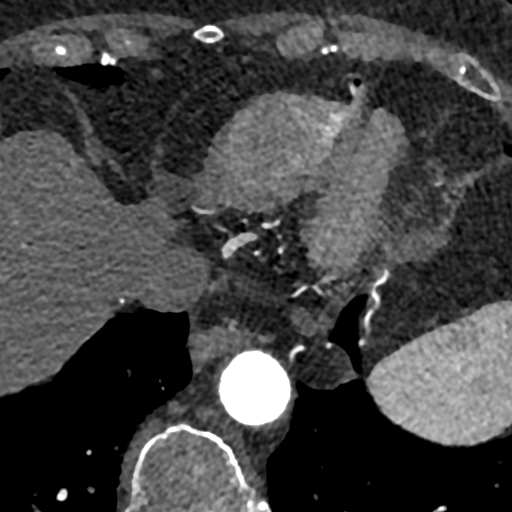
[im 687/2745  lung]
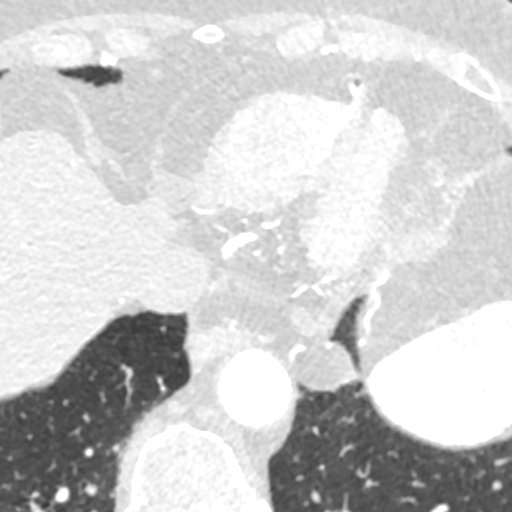
[im 1373/2745  vessel]
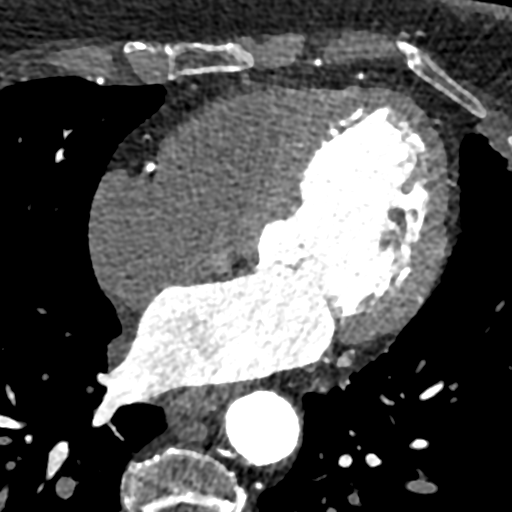
[im 2059/2745  vessel]
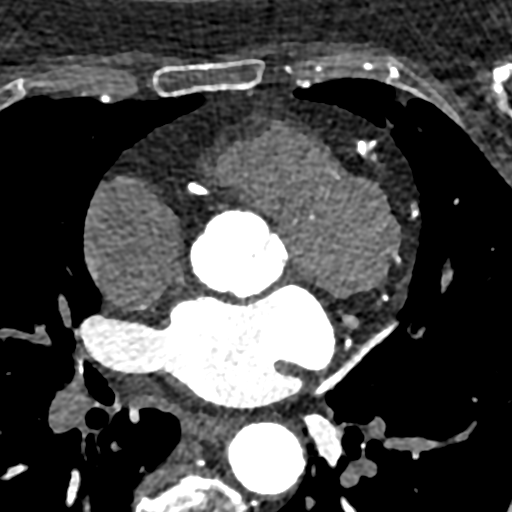

[3 of 20 positions shown; findings below may reference images not displayed]

FINDINGS: A retrospective scan was triggered in the descending thoracic aorta.
Axial non-contrast 3 mm slices were carried out through the heart.
The data set was analyzed on a dedicated work station and scored
using the Agatson method. Gantry rotation speed was 330 msecs and
collimation was .6 mm. 25mg of metoprolol and 0.8 mg of sl NTG was
given. The 3D data set was reconstructed in 5% intervals of the
60-95 % of the R-R cycle. Diastolic phases were analyzed on a
dedicated work station using MPR, MIP and VRT modes. The patient
received 75 cc of contrast.

Aorta: Normal size. Mild aortic root wall calcifications. No
dissection.

Aortic Valve:  Trileaflet.  No calcifications.

Coronary Arteries:  Normal coronary origin.  Right dominance.

RCA is a dominant artery that gives rise to PDA and PLA. There is
calcified plaque throughout the RCA causing mild stenosis (25-49%).

Left main is a large artery that gives rise to LAD and LCX arteries.
There is mild calcification in the mid to distal LM causing minimal
stenosis (<25%).

LAD is heavily calcified in the proximal to mid segments causing
moderate (50%) stenosis in the proximal segment and mild stenosis
(25-49%) in the mid segment.

LCX is a non-dominant artery that gives rise to one obtuse marginal
branch. There is no plaque.

Other findings:

Normal pulmonary vein drainage into the left atrium.

Normal left atrial appendage without a thrombus.

Normal size of the pulmonary artery.
IMPRESSION: 1. High coronary calcium score of 2288. This was 94th percentile for
age and sex matched control.

2. Normal coronary origin with right dominance.

3. Calcified plaque causing moderate stenosis (50%) in the proximal
LAD.

4. Calcified plaque causing mild (25-49%) stenosis throughout the
RCA and mid LAD.

5. CAD-RADS 3. Moderate stenosis. Consider symptom-guided
anti-ischemic pharmacotherapy as well as risk factor modification
per guideline directed care.

6. Additional analysis with CT FFR will be submitted and reported
separately.

EXAM:
OVER-READ INTERPRETATION  CT CHEST

The following report is an over-read performed by radiologist Dr.
Nicoleta Salad [[email or web address]] on 04/08/2020. This over-read
does not include interpretation of cardiac or coronary anatomy or
pathology. The coronary CTA interpretation by the cardiologist is
attached.
FINDINGS: Heart is normal size. Aorta normal caliber. Scattered calcifications
in the descending thoracic aorta. No adenopathy. Lingular scarring.
No effusions. Imaging into the upper abdomen demonstrates no acute
findings. Bilateral breast implants are noted. No acute bony
abnormality.
IMPRESSION: Scattered descending thoracic aortic atherosclerosis.

Lingular scarring.

No acute extra cardiac abnormality.

*** End of Addendum ***
FINDINGS: A retrospective scan was triggered in the descending thoracic aorta.
Axial non-contrast 3 mm slices were carried out through the heart.
The data set was analyzed on a dedicated work station and scored
using the Agatson method. Gantry rotation speed was 330 msecs and
collimation was .6 mm. 25mg of metoprolol and 0.8 mg of sl NTG was
given. The 3D data set was reconstructed in 5% intervals of the
60-95 % of the R-R cycle. Diastolic phases were analyzed on a
dedicated work station using MPR, MIP and VRT modes. The patient
received 75 cc of contrast.

Aorta: Normal size. Mild aortic root wall calcifications. No
dissection.

Aortic Valve:  Trileaflet.  No calcifications.

Coronary Arteries:  Normal coronary origin.  Right dominance.

RCA is a dominant artery that gives rise to PDA and PLA. There is
calcified plaque throughout the RCA causing mild stenosis (25-49%).

Left main is a large artery that gives rise to LAD and LCX arteries.
There is mild calcification in the mid to distal LM causing minimal
stenosis (<25%).

LAD is heavily calcified in the proximal to mid segments causing
moderate (50%) stenosis in the proximal segment and mild stenosis
(25-49%) in the mid segment.

LCX is a non-dominant artery that gives rise to one obtuse marginal
branch. There is no plaque.

Other findings:

Normal pulmonary vein drainage into the left atrium.

Normal left atrial appendage without a thrombus.

Normal size of the pulmonary artery.
IMPRESSION: 1. High coronary calcium score of 2288. This was 94th percentile for
age and sex matched control.

2. Normal coronary origin with right dominance.

3. Calcified plaque causing moderate stenosis (50%) in the proximal
LAD.

4. Calcified plaque causing mild (25-49%) stenosis throughout the
RCA and mid LAD.

5. CAD-RADS 3. Moderate stenosis. Consider symptom-guided
anti-ischemic pharmacotherapy as well as risk factor modification
per guideline directed care.

6. Additional analysis with CT FFR will be submitted and reported
separately.

## 2022-08-15 ENCOUNTER — Ambulatory Visit (INDEPENDENT_AMBULATORY_CARE_PROVIDER_SITE_OTHER): Payer: Medicare Other | Admitting: Nurse Practitioner

## 2022-08-15 ENCOUNTER — Ambulatory Visit (INDEPENDENT_AMBULATORY_CARE_PROVIDER_SITE_OTHER)
Admission: RE | Admit: 2022-08-15 | Discharge: 2022-08-15 | Disposition: A | Discharge status: Home / Self Care | Payer: Medicare Other | Source: Ambulatory Visit | Attending: Nurse Practitioner | Admitting: Nurse Practitioner

## 2022-08-15 ENCOUNTER — Encounter: Payer: Self-pay | Admitting: Nurse Practitioner

## 2022-08-15 VITALS — BP 124/68 | HR 65 | Temp 98.0°F | Resp 16 | Ht 65.0 in | Wt 180.2 lb

## 2022-08-15 DIAGNOSIS — R053 Chronic cough: Secondary | ICD-10-CM

## 2022-08-15 DIAGNOSIS — M6289 Other specified disorders of muscle: Secondary | ICD-10-CM

## 2022-08-15 DIAGNOSIS — F419 Anxiety disorder, unspecified: Secondary | ICD-10-CM | POA: Diagnosis not present

## 2022-08-15 DIAGNOSIS — F32A Depression, unspecified: Secondary | ICD-10-CM

## 2022-08-15 MED ORDER — PREDNISONE 20 MG PO TABS: ORAL_TABLET | ORAL | 0 refills | Status: AC

## 2022-08-15 MED ORDER — TIZANIDINE HCL 2 MG PO CAPS: 2.0000 mg | ORAL_CAPSULE | Freq: Every evening | ORAL | 1 refills | Status: DC | PRN

## 2022-08-15 MED ORDER — ESCITALOPRAM OXALATE 10 MG PO TABS: 10.0000 mg | ORAL_TABLET | Freq: Every day | ORAL | 0 refills | Status: DC

## 2022-08-15 NOTE — Assessment & Plan Note (Signed)
Cough since January will update chest x-ray today.  Patient is using albuterol and Wixela.  Will put her on prednisone to see if this is beneficial.  Low suspicion for infectious cause pending chest x-ray

## 2022-08-15 NOTE — Progress Notes (Signed)
Acute Office Visit  Subjective:     Patient ID: Christine [name], female    DOB: 1934/01/13, 89 y.o.   MRN: [id number]  Chief Complaint  Patient presents with   Cough    Keeps coming and going Jan.     Patient is in today for cough with a history of htn, asthma, GERD, hypothyroidism, DOE  Cough has been intermittent for since janurary. Statea that she has had it consistently. States that this week has been worse. States that it was fine this morning   States that she has used her albuterol inhaler with little relief. State that she is using the wixela inhaler. States that she was followed by Pulmology in the past.   States that she is dealing with a lot at home with her daughter.  States she also lives with her grandson.  Patient has been on Lexapro in the past and seem to be beneficial but she has stopped it.  He is to go Lexapro 10 mg.  Patient states that she feels like she is crying all the time.  Somewhat of a feeling of being overwhelmed/tired.  Review of Systems  Constitutional:  Negative for chills and fever.  HENT:  Negative for ear discharge, ear pain and sore throat.   Respiratory:  Positive for cough and shortness of breath. Negative for sputum production and wheezing.         Objective:    BP 124/68   Pulse 65   Temp 98 F (36.7 C)   Resp 16   Ht 5\' 5"  (1.651 m)   Wt 180 lb 4 oz (81.8 kg)   SpO2 97%   BMI 30.00 kg/m  BP Readings from Last 3 Encounters:  08/15/22 124/68  05/12/22 128/60  04/19/22 (!) 140/68   Wt Readings from Last 3 Encounters:  08/15/22 180 lb 4 oz (81.8 kg)  05/12/22 176 lb 6 oz (80 kg)  04/19/22 177 lb 9.6 oz (80.6 kg)      Physical Exam Vitals and nursing note reviewed.  Constitutional:      Appearance: Normal appearance.  HENT:     Right Ear: Tympanic membrane, ear canal and external ear normal.     Left Ear: Tympanic membrane, ear canal and external ear normal.     Ears:     Comments: Clear fluid behind right TM   Cardiovascular:     Rate and Rhythm: Normal rate and regular rhythm.     Heart sounds: Normal heart sounds.  Pulmonary:     Effort: Pulmonary effort is normal.     Breath sounds: Normal breath sounds.  Neurological:     Mental Status: She is alert.     No results found for any visits on 08/15/22.      Assessment & Plan:   Problem List Items Addressed This Visit       Other   Muscle tightness   Relevant Medications   tizanidine (ZANAFLEX) 2 MG capsule   Chronic cough    Cough since January will update chest x-ray today.  Patient is using albuterol and Wixela.  Will put her on prednisone to see if this is beneficial.  Low suspicion for infectious cause pending chest x-ray      Relevant Medications   predniSONE (DELTASONE) 20 MG tablet   Other Relevant Orders   DG Chest 2 View (Completed)   Anxiety and depression - Primary    Will start patient back on Lexapro 10 mg.  Patient states  she is started on 10 mg in the past and tolerated well.  Patient does have issues she can split the 10 mg tablets in half and take 1/2 tablet daily for 2 weeks and then bounced up to the full 10 mg tablet.  Patient to follow-up in 6 weeks      Relevant Medications   escitalopram (LEXAPRO) 10 MG tablet    Meds ordered this encounter  Medications   tizanidine (ZANAFLEX) 2 MG capsule    Sig: Take 1 capsule (2 mg total) by mouth at bedtime as needed for muscle spasms.    Dispense:  30 capsule    Refill:  1   predniSONE (DELTASONE) 20 MG tablet    Sig: Take 2 tablets (40 mg total) by mouth daily with breakfast for 3 days, THEN 1 tablet (20 mg total) daily with breakfast for 3 days. Avoid NSAIDS line ibuprofen, motrin, aleve, naproxen, BC/Goody powders.    Dispense:  9 tablet    Refill:  0    Order Specific Question:   Supervising Provider    Answer:   Milinda Antis MARNE A [[id number]]   escitalopram (LEXAPRO) 10 MG tablet    Sig: Take 1 tablet (10 mg total) by mouth daily.    Dispense:  90 tablet     Refill:  0    Order Specific Question:   Supervising Provider    Answer:   Roxy Manns A [[id number]]    Return in about 6 weeks (around 09/26/2022) for MDD/GAD recheck .  Audria Nine, NP

## 2022-08-15 NOTE — Assessment & Plan Note (Signed)
Will start patient back on Lexapro 10 mg.  Patient states she is started on 10 mg in the past and tolerated well.  Patient does have issues she can split the 10 mg tablets in half and take 1/2 tablet daily for 2 weeks and then bounced up to the full 10 mg tablet.  Patient to follow-up in 6 weeks

## 2022-08-15 NOTE — Patient Instructions (Signed)
Nice to see you today I will be in touch with the xray once I have reviewed it Follow up in 6 weeks for medication recheck, sooner if you need me   I have sent in 10mg   lexapro if you have trouble with the 10mg  half the tablets for 2 weeks

## 2022-08-16 ENCOUNTER — Other Ambulatory Visit: Payer: Self-pay | Admitting: Nurse Practitioner

## 2022-08-16 MED ORDER — TIZANIDINE HCL 2 MG PO TABS: 2.0000 mg | ORAL_TABLET | Freq: Every evening | ORAL | 1 refills | Status: DC | PRN

## 2022-09-12 ENCOUNTER — Other Ambulatory Visit: Payer: Self-pay | Admitting: Physician Assistant

## 2022-10-11 ENCOUNTER — Other Ambulatory Visit: Payer: Self-pay | Admitting: Physician Assistant

## 2022-10-11 DIAGNOSIS — I10 Essential (primary) hypertension: Secondary | ICD-10-CM

## 2022-10-11 NOTE — Telephone Encounter (Signed)
Please call pt to schedule f/u appt. . Thanks

## 2022-10-12 ENCOUNTER — Other Ambulatory Visit: Payer: Self-pay | Admitting: Nurse Practitioner

## 2022-10-12 DIAGNOSIS — K219 Gastro-esophageal reflux disease without esophagitis: Secondary | ICD-10-CM

## 2022-10-20 ENCOUNTER — Encounter: Payer: Self-pay | Admitting: Nurse Practitioner

## 2022-10-20 ENCOUNTER — Ambulatory Visit (INDEPENDENT_AMBULATORY_CARE_PROVIDER_SITE_OTHER): Payer: Medicare Other | Admitting: Nurse Practitioner

## 2022-10-20 VITALS — BP 122/76 | HR 68 | Temp 98.2°F | Ht 65.0 in | Wt 179.2 lb

## 2022-10-20 DIAGNOSIS — H6991 Unspecified Eustachian tube disorder, right ear: Secondary | ICD-10-CM | POA: Diagnosis not present

## 2022-10-20 DIAGNOSIS — R519 Headache, unspecified: Secondary | ICD-10-CM | POA: Diagnosis not present

## 2022-10-20 DIAGNOSIS — R053 Chronic cough: Secondary | ICD-10-CM | POA: Diagnosis not present

## 2022-10-20 DIAGNOSIS — H6990 Unspecified Eustachian tube disorder, unspecified ear: Secondary | ICD-10-CM | POA: Insufficient documentation

## 2022-10-20 MED ORDER — NORTRIPTYLINE HCL 10 MG PO CAPS
10.0000 mg | ORAL_CAPSULE | Freq: Every day | ORAL | 1 refills | Status: DC
Start: 2022-10-20 — End: 2022-12-06

## 2022-10-20 MED ORDER — PREDNISONE 20 MG PO TABS
ORAL_TABLET | ORAL | 0 refills | Status: AC
Start: 2022-10-20 — End: 2022-10-26

## 2022-10-20 MED ORDER — DOXYCYCLINE HYCLATE 100 MG PO TABS
100.0000 mg | ORAL_TABLET | Freq: Two times a day (BID) | ORAL | 0 refills | Status: AC
Start: 2022-10-20 — End: 2022-10-27
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
## 2022-10-20 NOTE — Assessment & Plan Note (Signed)
History of the same.  Patient is on antihistamine and topical nasal spray will do a course of steroids

## 2022-10-20 NOTE — Assessment & Plan Note (Signed)
Has been seen by me for this also follow-up pulmonology on Wixela and albuterol.  Prednisone did help.  Last time chest x-ray was within normal limits with possible bronchitis.  Will treat with doxycycline 100 mg twice daily prednisone taper.  Continue inhalers as prescribed by pulmonology

## 2022-10-20 NOTE — Patient Instructions (Addendum)
Nice to see you today We will do another round of prednisone and an antibiotic. I have sent in a medication to help prevent headaches but can cause sleepiness. Do not take this and the muscle relaxer together until you know how this medication will effect you
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
## 2022-10-26 ENCOUNTER — Encounter (INDEPENDENT_AMBULATORY_CARE_PROVIDER_SITE_OTHER): Payer: Self-pay

## 2022-10-27 ENCOUNTER — Other Ambulatory Visit: Payer: Self-pay | Admitting: Nurse Practitioner

## 2022-10-27 DIAGNOSIS — F32A Depression, unspecified: Secondary | ICD-10-CM

## 2022-10-27 MED ORDER — ESCITALOPRAM OXALATE 10 MG PO TABS
10.0000 mg | ORAL_TABLET | Freq: Every day | ORAL | 0 refills | Status: DC
Start: 2022-10-27 — End: 2022-12-21

## 2022-10-27 NOTE — Telephone Encounter (Signed)
Received fax for refill. Pended for approval  LAST APPOINTMENT DATE: 10/20/2022   NEXT APPOINTMENT DATE: 11/17/2022    LAST REFILL:08/15/22  QTY: 90

## 2022-10-30 ENCOUNTER — Other Ambulatory Visit: Payer: Self-pay | Admitting: Physician Assistant

## 2022-11-14 ENCOUNTER — Other Ambulatory Visit
Admission: RE | Admit: 2022-11-14 | Discharge: 2022-11-14 | Disposition: A | Discharge status: Home / Self Care | Payer: Medicare Other | Source: Ambulatory Visit | Attending: Physician Assistant | Admitting: Physician Assistant

## 2022-11-14 ENCOUNTER — Other Ambulatory Visit: Payer: Self-pay | Admitting: Emergency Medicine

## 2022-11-14 ENCOUNTER — Telehealth: Payer: Self-pay

## 2022-11-14 ENCOUNTER — Encounter: Payer: Self-pay | Admitting: Physician Assistant

## 2022-11-14 ENCOUNTER — Ambulatory Visit: Payer: Medicare Other | Attending: Physician Assistant | Admitting: Physician Assistant

## 2022-11-14 VITALS — BP 138/76 | HR 74 | Ht 65.0 in | Wt 173.8 lb

## 2022-11-14 DIAGNOSIS — I6523 Occlusion and stenosis of bilateral carotid arteries: Secondary | ICD-10-CM | POA: Diagnosis not present

## 2022-11-14 DIAGNOSIS — I25118 Atherosclerotic heart disease of native coronary artery with other forms of angina pectoris: Secondary | ICD-10-CM | POA: Diagnosis not present

## 2022-11-14 DIAGNOSIS — E785 Hyperlipidemia, unspecified: Secondary | ICD-10-CM | POA: Diagnosis not present

## 2022-11-14 DIAGNOSIS — R072 Precordial pain: Secondary | ICD-10-CM | POA: Diagnosis not present

## 2022-11-14 DIAGNOSIS — I771 Stricture of artery: Secondary | ICD-10-CM

## 2022-11-14 DIAGNOSIS — I10 Essential (primary) hypertension: Secondary | ICD-10-CM | POA: Diagnosis not present

## 2022-11-14 LAB — LIPID PANEL
Cholesterol: 152 mg/dL (ref 0–200)
HDL: 60 mg/dL (ref >40)
LDL Cholesterol: 57 mg/dL (ref 0–99)
Total CHOL/HDL Ratio: 2.5 ratio
Triglycerides: 174 mg/dL — ABNORMAL HIGH (ref <150)
VLDL: 35 mg/dL (ref 0–40)

## 2022-11-14 LAB — COMPREHENSIVE METABOLIC PANEL
ALT: 21 U/L (ref 0–44)
AST: 26 U/L (ref 15–41)
Albumin: 4.4 g/dL (ref 3.5–5.0)
Alkaline Phosphatase: 67 U/L (ref 38–126)
Anion gap: 13 (ref 5–15)
BUN: 17 mg/dL (ref 8–23)
CO2: 25 mmol/L (ref 22–32)
Calcium: 9.7 mg/dL (ref 8.9–10.3)
Chloride: 98 mmol/L (ref 98–111)
Creatinine, Ser: 0.95 mg/dL (ref 0.44–1.00)
GFR, Estimated: 58 mL/min — ABNORMAL LOW (ref >60)
Glucose, Bld: 142 mg/dL — ABNORMAL HIGH (ref 70–99)
Potassium: 3.6 mmol/L (ref 3.5–5.1)
Sodium: 136 mmol/L (ref 135–145)
Total Bilirubin: 1.2 mg/dL (ref 0.3–1.2)
Total Protein: 7.8 g/dL (ref 6.5–8.1)

## 2022-11-14 LAB — TROPONIN I (HIGH SENSITIVITY): Troponin I (High Sensitivity): 7 ng/L (ref <18)

## 2022-11-14 MED ORDER — METOPROLOL TARTRATE 50 MG PO TABS: ORAL_TABLET | ORAL | 0 refills | Status: DC

## 2022-11-14 NOTE — Telephone Encounter (Signed)
Pt made aware of results along with Ryan's recommendations. Pt verbalized understanding. Cardiac CTA ordered and pre test instructions provided    Sondra Barges, PA-C 11/14/2022 12:45 PM EDT Back to Top    Negative troponin reviewed with patient over the phone. Triglycerides mildly elevated, nonfasting sample Total cholesterol, HDL, and LDL at goal Glucose mildly elevated, nonfasting sample Liver and kidney function normal Potassium normal   Recommendations: -Please schedule patient for coronary CTA (diagnosis coronary artery disease involving the native coronary arteries with precordial pain)   Your cardiac CT will be scheduled at one of the below locations:   [facility] [address] [place], [place] [address] [phone]  OR   [facility] [address] [place], [place] [address] [phone]  If scheduled at [facility], please arrive at the [facility] and Children's Entrance (Entrance C2) of [facility] 30 minutes prior to test start time. You can use the FREE valet parking offered at entrance C (encouraged to control the heart rate for the test)  Proceed to the [facility] Radiology Department (first floor) to check-in and test prep.  All radiology patients and guests should use entrance C2 at [facility], accessed from [facility], even though the hospital's physical address listed is [address]    If scheduled at [facility] or [facility], please arrive 15 mins early for check-in and test prep.  There is spacious parking and easy access to the radiology department from the [facility] Heart and Vascular entrance. Please enter here and check-in with the desk attendant.   Please follow these instructions carefully (unless otherwise directed):  An IV will be required for this test and Nitroglycerin will be given.   Hold all erectile dysfunction medications at least 3 days (72 hrs) prior to test. (Ie viagra, cialis, sildenafil, tadalafil, etc)   On the Night Before the Test: Be sure to Drink plenty of water. Do not consume any caffeinated/decaffeinated beverages or chocolate 12 hours prior to your test. Do not take any antihistamines 12 hours prior to your test.  On the Day of the Test: Drink plenty of water until 1 hour prior to the test. Do not eat any food 1 hour prior to test. You may take your regular medications prior to the test.  Take metoprolol (Lopressor) 50 mg  two hours prior to test. If you take /Hydrochlorothiazide please HOLD on the morning of the test. FEMALES- please wear underwire-free bra if available, avoid dresses & tight clothing   After the Test: Drink plenty of water. After receiving IV contrast, you may experience a mild flushed feeling. This is normal. On occasion, you may experience a mild rash up to 24 hours after the test. This is not dangerous. If this occurs, you can take Benadryl 25 mg and increase your fluid intake. If you experience trouble breathing, this can be serious. If it is severe call 911 IMMEDIATELY. If it is mild, please call our office. If you take any of these medications: Glipizide/Metformin, Avandament, Glucavance, please do not take 48 hours after completing test unless otherwise instructed.  We will call to schedule your test 2-4 weeks out understanding that some insurance companies will need an authorization prior to the service being performed.   For more information and frequently asked questions, please visit our website : [email or web address]  For non-scheduling related questions, please contact the cardiac imaging nurse navigator  should you have any questions/concerns: Cardiac Imaging Nurse Navigators Direct Office Dial: [phone]   For scheduling needs, including cancellations and rescheduling, please call [place],  [phone].
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
## 2022-11-14 NOTE — Patient Instructions (Signed)
Medication Instructions:  Your Physician recommend you continue on your current medication as directed.    *If you need a refill on your cardiac medications before your next appointment, please call your pharmacy*   Lab Work: Your provider would like for you to have following labs drawn today STAT Troponin, CMET, and Lipid Profile.   If you have labs (blood work) drawn today and your tests are completely normal, you will receive your results only by: MyChart Message (if you have MyChart) OR A paper copy in the mail If you have any lab test that is abnormal or we need to change your treatment, we will call you to review the results.   Testing/Procedures: Not at this time   Follow-Up: At [facility], you and your health needs are our priority.  As part of our continuing mission to provide you with exceptional heart care, we have created designated Provider Care Teams.  These Care Teams include your primary Cardiologist (physician) and Advanced Practice Providers (APPs -  Physician Assistants and Nurse Practitioners) who all work together to provide you with the care you need, when you need it.  We recommend signing up for the patient portal called "MyChart".  Sign up information is provided on this After Visit Summary.  MyChart is used to connect with patients for Virtual Visits (Telemedicine).  Patients are able to view lab/test results, encounter notes, upcoming appointments, etc.  Non-urgent messages can be sent to your provider as well.   To learn more about what you can do with MyChart, go to [email or web address].    Your next appointment:   1 month(s)  Provider:   You may see Yvonne Kendall, MD or one of the following Advanced Practice Providers on your designated Care Team:   Eula Listen, [place]

## 2022-11-17 ENCOUNTER — Ambulatory Visit (INDEPENDENT_AMBULATORY_CARE_PROVIDER_SITE_OTHER): Payer: Medicare Other | Admitting: Nurse Practitioner

## 2022-11-17 ENCOUNTER — Encounter: Payer: Self-pay | Admitting: Nurse Practitioner

## 2022-11-17 VITALS — BP 110/72 | HR 72 | Temp 98.3°F | Ht 65.0 in | Wt 178.0 lb

## 2022-11-17 DIAGNOSIS — J4520 Mild intermittent asthma, uncomplicated: Secondary | ICD-10-CM | POA: Diagnosis not present

## 2022-11-17 DIAGNOSIS — R519 Headache, unspecified: Secondary | ICD-10-CM | POA: Diagnosis not present

## 2022-11-17 DIAGNOSIS — R053 Chronic cough: Secondary | ICD-10-CM | POA: Diagnosis not present

## 2022-11-17 NOTE — Assessment & Plan Note (Signed)
Has been evaluated by chest x-ray did a dose of steroids orally, antibiotics.  Patient started using the Flonase regularly this is seem to help.  States she does have a cough but it is tolerable patient also has underlying asthma that is irritant driven.  Can take inhalers as prescribed follow-up with pulmonology as recommended

## 2022-11-17 NOTE — Assessment & Plan Note (Signed)
Patient follow-up pulmonology currently on Wixela and albuterol as needed.  It is worsened with irritant exposure like cigarette smoke.  Continue taking medication as prescribed continue following pulmonology as recommended
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
## 2022-11-17 NOTE — Assessment & Plan Note (Signed)
Has responded some to nortriptyline.  Patient can take the muscle relaxer as needed but needs to separate them between 2 to 4 hours each.  Did offer to refer patient to neurology patient declined at this current juncture.  Continue medication as prescribed

## 2022-11-28 ENCOUNTER — Encounter (HOSPITAL_COMMUNITY): Payer: Self-pay

## 2022-11-30 ENCOUNTER — Other Ambulatory Visit: Payer: Self-pay | Admitting: Physician Assistant

## 2022-11-30 ENCOUNTER — Ambulatory Visit
Admission: RE | Admit: 2022-11-30 | Discharge: 2022-11-30 | Disposition: A | Discharge status: Home / Self Care | Payer: Medicare Other | Source: Ambulatory Visit | Attending: Physician Assistant | Admitting: Physician Assistant

## 2022-11-30 DIAGNOSIS — I2510 Atherosclerotic heart disease of native coronary artery without angina pectoris: Secondary | ICD-10-CM

## 2022-11-30 DIAGNOSIS — R072 Precordial pain: Secondary | ICD-10-CM | POA: Diagnosis not present

## 2022-11-30 DIAGNOSIS — R931 Abnormal findings on diagnostic imaging of heart and coronary circulation: Secondary | ICD-10-CM | POA: Diagnosis not present

## 2022-11-30 MED ORDER — IOHEXOL 350 MG/ML SOLN
75.0000 mL | Freq: Once | INTRAVENOUS | Status: AC | PRN
  Administered 2022-11-30: 75 mL via INTRAVENOUS

## 2022-11-30 MED ORDER — NITROGLYCERIN 0.4 MG SL SUBL
0.8000 mg | SUBLINGUAL_TABLET | Freq: Once | SUBLINGUAL | Status: AC
  Administered 2022-11-30: 0.8 mg via SUBLINGUAL

## 2022-11-30 MED ORDER — SODIUM CHLORIDE 0.9 % IV SOLN: INTRAVENOUS | Status: DC

## 2022-11-30 NOTE — Progress Notes (Signed)
Patient tolerated procedure well. Ambulate w/o difficulty. Denies light headedness or being dizzy. Drinking water provided. Encouraged to drink extra water today and reasoning explained. Verbalized understanding. All questions answered. ABC intact. No further needs. Discharge from procedure area w/o issues.

## 2022-12-01 ENCOUNTER — Telehealth: Payer: Self-pay

## 2022-12-01 NOTE — Telephone Encounter (Signed)
3rd attempt this morning to contact pt regarding results.  No answer, left message that someone will reach out this afternoon.

## 2022-12-01 NOTE — Telephone Encounter (Signed)
Patient returning call.

## 2022-12-01 NOTE — Telephone Encounter (Signed)
Attempted to contact pt to review results. Left message for her to call back

## 2022-12-01 NOTE — Telephone Encounter (Signed)
Patient made aware of results and verbalized understanding.  Appointment made with Dr. Okey Dupre on 12/06/22.  Cardiac overread: Calcium score 2280 Significant narrowing involving the proximal to mid LAD estimated at greater than 70% moderate narrowing involving the proximal RCA estimated at 50 to 69% and mild to moderate narrowing involving the left main and left circumflex estimated at 25 to 49%.   Recommendations: -Schedule patient to be seen within the next week for discussion of cardiac cath -Continue current medications including aspirin and Crestor (LDL 57 in 11/2022) -We will reach back out as well once the noncardiac overread has been read by radiology, which can take a week or more

## 2022-12-01 NOTE — Progress Notes (Signed)
Lmom 9/20

## 2022-12-01 NOTE — Telephone Encounter (Signed)
-----   Message from Eula Listen sent at 11/30/2022  5:51 PM EDT ----- CT FFR showed significant narrowing in the LAD and RCA.  Please see result note for coronary CTA with recommendations to have the patient evaluated within the next week if possible to discuss cardiac cath.

## 2022-12-01 NOTE — Telephone Encounter (Signed)
Attempted to call pt back, left message that I will attempt again shortly.

## 2022-12-01 NOTE — Progress Notes (Signed)
See note from Misty Stanley, RN, established contact with pt, apprised of results, and made appt with Dr End

## 2022-12-06 ENCOUNTER — Encounter: Payer: Self-pay | Admitting: Internal Medicine

## 2022-12-06 ENCOUNTER — Ambulatory Visit: Payer: Medicare Other | Attending: Internal Medicine | Admitting: Internal Medicine

## 2022-12-06 VITALS — BP 132/78 | HR 65 | Ht 65.0 in | Wt 173.0 lb

## 2022-12-06 DIAGNOSIS — I25119 Atherosclerotic heart disease of native coronary artery with unspecified angina pectoris: Secondary | ICD-10-CM | POA: Diagnosis not present

## 2022-12-06 DIAGNOSIS — R079 Chest pain, unspecified: Secondary | ICD-10-CM | POA: Diagnosis not present

## 2022-12-06 DIAGNOSIS — I10 Essential (primary) hypertension: Secondary | ICD-10-CM | POA: Insufficient documentation

## 2022-12-06 DIAGNOSIS — E785 Hyperlipidemia, unspecified: Secondary | ICD-10-CM | POA: Insufficient documentation

## 2022-12-06 MED ORDER — NITROGLYCERIN 0.4 MG SL SUBL: 0.4000 mg | SUBLINGUAL_TABLET | SUBLINGUAL | 3 refills | Status: DC | PRN

## 2022-12-06 MED ORDER — ISOSORBIDE MONONITRATE ER 30 MG PO TB24: 30.0000 mg | ORAL_TABLET | Freq: Every day | ORAL | 3 refills | Status: DC

## 2022-12-06 NOTE — H&P (View-Only) (Signed)
Cardiology Office Note:  .   Date:  12/06/2022  ID:  [name], DOB 12-Oct-1933, MRN [id number] PCP: Eden Emms, NP  [facility] Providers Cardiologist:  Yvonne Kendall, MD     History of Present Illness: .   Christine [name] is a 89 y.o. female with history of coronary artery disease, carotid artery stenosis, hypertension, hyperlipidemia, rheumatic fever, asthma, hypothyroidism, and GERD, who presents for follow-up of chest pain and abnormal coronary CTA.  She was last seen in our office 3 weeks ago by Eula Listen, PA, at which time she complained of an episode of chest pain 2 days earlier.  EKG showed normal sinus rhythm with isolated PVC and no ischemic changes.  High-sensitivity troponin I was normal.  Coronary CTA was subsequently performed and showed high calcium score (2280) and multivessel CAD including greater than 70% stenosis in the proximal/mid LAD, 50-69% proximal RCA, and 25-50% LMCA and LCx.  Today, Christine [name] reports that she has been feeling fairly well without any further episodes of chest heaviness like what brought her to our office earlier this month.  However, she notices vague discomfort between her shoulder blades that waxes and wanes.  It is not clearly exertional or related to any particular movement, though it seems to be a little more pronounced when she is watching television at night.  She has not had any significant dyspnea nor palpitations lightheadedness.  She often has mild swelling in her ankles, which is chronic.  She remains quite active at home and is currently caring for her daughter, who fractured her hip and collarbone, as well as her special needs grandson.  ROS: See HPI  Studies Reviewed: Marland Kitchen   EKG Interpretation Date/Time:  Wednesday December 06 2022 10:42:44 EDT Ventricular Rate:  65 PR Interval:  158 QRS Duration:  88 QT Interval:  412 QTC Calculation: 428 R Axis:   12  Text Interpretation: Normal sinus rhythm Normal ECG  When compared with ECG of 14-Nov-2022 10:03, Premature ventricular complexes are no longer Present Otherwise no significant change Confirmed by Kaladin Noseworthy, Cristal Deer [phone]) on 12/06/2022 10:53:49 AM    Coronary CTA (11/30/2022): Multivessel coronary artery disease with mild LMCA disease.  Severe proximal/mid LAD stenosis.  Mild LCx disease.  Moderate proximal RCA stenosis.  Coronary artery calcium score 2280.  CT FFR hemodynamically significant in the mid LAD and proximal RCA; borderline significant in the LCx.  Risk Assessment/Calculations:             Physical Exam:   VS:  BP 132/78 (BP Location: Left Arm, Patient Position: Sitting, Cuff Size: Normal)   Pulse 65   Ht 5\' 5"  (1.651 m)   Wt 173 lb (78.5 kg)   SpO2 97%   BMI 28.79 kg/m    Wt Readings from Last 3 Encounters:  12/06/22 173 lb (78.5 kg)  11/17/22 178 lb (80.7 kg)  11/14/22 173 lb 12.8 oz (78.8 kg)    General:  NAD. Neck: No JVD or HJR. Lungs: Clear to auscultation bilaterally without wheezes or crackles. Heart: Regular rate and rhythm without murmurs, rubs, or gallops. Abdomen: Soft, nontender, nondistended. Extremities: Trace ankle edema bilaterally.  2+ radial pulses bilaterally.  ASSESSMENT AND PLAN: .    Coronary artery disease with angina: Recent coronary CTA suggested severe 2-3 vessel CAD.  Given that Christine [name] is quite functional and had an episode of fairly pronounced typical chest pain earlier this month, I think it would be prudent to move forward with cardiac catheterization and  possible PCI.  The vague pain between her shoulder blades could certainly be an anginal equivalent as well or some noncardiac process.  Her EKG today does not show any acute ischemic changes.  We have discussed further evaluation options and have agreed to move forward with cardiac catheterization and possible PCI.  We will plan to do this on Monday at [facility], as her heavy coronary calcium burden may necessitate  atherectomy/lithotripsy if PCI is needed.  Though she is not eager to undergo CABG, if necessary, she would be amenable to this as she is still quite functional at age 19.  She endorses FULL CODE status for the procedure but notes that she would not wish to be on mechanical life support indefinitely.  In the meantime, we will continue current doses of aspirin and amlodipine.  I will add isosorbide mononitrate for additional antianginal therapy and also provide Christine [name] for prescription for as needed sublingual nitroglycerin.  Rosuvastatin should be continued for secondary prevention.  Hypertension: Blood pressure upper normal today.  As above, we will add isosorbide mononitrate 30 mg daily.  Continue current regimen of amlodipine, HCTZ, and losartan as well.  Hyperlipidemia: LDL well-controlled at 57 on last check earlier this month.  Triglycerides were mildly elevated at 174, albeit from a nonfasting sample.  Continue rosuvastatin 20 mg daily.    Informed Consent   Shared Decision Making/Informed Consent The risks [stroke (1 in 1000), death (1 in 1000), kidney failure [usually temporary] (1 in 500), bleeding (1 in 200), allergic reaction [possibly serious] (1 in 200)], benefits (diagnostic support and management of coronary artery disease) and alternatives of a cardiac catheterization were discussed in detail with Christine [name] and she is willing to proceed.     Dispo: Return to clinic in 3 weeks.  Signed, Yvonne Kendall, MD

## 2022-12-06 NOTE — Patient Instructions (Addendum)
Medication Instructions:  Your physician recommends the following medication changes.  START TAKING: Imdur 30 mg by mouth daily  For as needed Nitroglycerin, if you develop chest pain: Sit and rest 5 minutes. If chest pain does not resolve place 1 nitroglycerin under your tongue and wait 5 minutes. If chest pain does not resolve, place a 2nd nitroglycerin under your tongue and wait 5 more minutes. If chest pain does not resolve, place a 3rd nitroglycerin under your tongue and seek emergency services.   *If you need a refill on your cardiac medications before your next appointment, please call your pharmacy*   Lab Work: Your provider would like for you to have following labs drawn today (CBC, BMP).     Testing/Procedures: Your physician has requested that you have a cardiac catheterization. Cardiac catheterization is used to diagnose and/or treat various heart conditions. Doctors may recommend this procedure for a number of different reasons. The most common reason is to evaluate chest pain. Chest pain can be a symptom of coronary artery disease (CAD), and cardiac catheterization can show whether plaque is narrowing or blocking your heart's arteries. This procedure is also used to evaluate the valves, as well as measure the blood flow and oxygen levels in different parts of your heart. For further information please visit [email or web address] Please follow instruction sheet, as given. Please see instructions below   Follow-Up: At [facility], you and your health needs are our priority.  As part of our continuing mission to provide you with exceptional heart care, we have created designated Provider Care Teams.  These Care Teams include your primary Cardiologist (physician) and Advanced Practice Providers (APPs -  Physician Assistants and Nurse Practitioners) who all work together to provide you with the care you need, when you need it.  We recommend signing up for the patient  portal called "MyChart".  Sign up information is provided on this After Visit Summary.  MyChart is used to connect with patients for Virtual Visits (Telemedicine).  Patients are able to view lab/test results, encounter notes, upcoming appointments, etc.  Non-urgent messages can be sent to your provider as well.   To learn more about what you can do with MyChart, go to [email or web address].    Your next appointment:   3 week(s)  Provider:   You may see Yvonne Kendall, MD or one of the following Advanced Practice Providers on your designated Care Team:   Nicolasa Ducking, NP Eula Listen, PA-C Cadence Fransico Michael, PA-C Charlsie Quest, NP    [facility] A DEPT OF [place] AT [facility] [address] [place] [address] Dept: [phone] Loc: [phone]  Brigita Gutridge  12/06/2022  You are scheduled for a Cardiac Catheterization on Monday, September 30 with Dr. Cristal Deer End.  1. Please arrive at the [facility] (Main Entrance A) at [facility]: [address] [place], [place] [address] at 8:30 AM (This time is 2 hour(s) before your procedure to ensure your preparation). Free valet parking service is available. You will check in at ADMITTING. The support person will be asked to wait in the waiting room.  It is OK to have someone drop you off and come back when you are ready to be discharged.    Special note: Every effort is made to have your procedure done on time. Please understand that emergencies sometimes delay scheduled procedures.  2. Diet: Do not eat solid foods after midnight.  The patient may  have clear liquids until 5am upon the day of the procedure.  3. Labs: You will need to have blood drawn today (CBC, BMP)  4. Medication instructions in preparation for your procedure:   Contrast Allergy: No Hold Hydrochlorothiazide morning of procedure   On the morning of your procedure, take your  Aspirin 81 mg and any morning medicines NOT listed above.  You may use sips of water.  5. Plan to go home the same day, you will only stay overnight if medically necessary. 6. Bring a current list of your medications and current insurance cards. 7. You MUST have a responsible person to drive you home. 8. Someone MUST be with you the first 24 hours after you arrive home or your discharge will be delayed. 9. Please wear clothes that are easy to get on and off and wear slip-on shoes.  Thank you for allowing [place] to care for you!   -- [facility]

## 2022-12-06 NOTE — Progress Notes (Signed)
Cardiology Office Note:  .   Date:  12/06/2022  ID:  Christine [name], DOB 12-Oct-1933, MRN [id number] PCP: Eden Emms, NP  [facility] Providers Cardiologist:  Yvonne Kendall, MD     History of Present Illness: .   Christine [name] is a 89 y.o. female with history of coronary artery disease, carotid artery stenosis, hypertension, hyperlipidemia, rheumatic fever, asthma, hypothyroidism, and GERD, who presents for follow-up of chest pain and abnormal coronary CTA.  She was last seen in our office 3 weeks ago by Eula Listen, PA, at which time she complained of an episode of chest pain 2 days earlier.  EKG showed normal sinus rhythm with isolated PVC and no ischemic changes.  High-sensitivity troponin I was normal.  Coronary CTA was subsequently performed and showed high calcium score (2280) and multivessel CAD including greater than 70% stenosis in the proximal/mid LAD, 50-69% proximal RCA, and 25-50% LMCA and LCx.  Today, Christine [name] reports that she has been feeling fairly well without any further episodes of chest heaviness like what brought her to our office earlier this month.  However, she notices vague discomfort between her shoulder blades that waxes and wanes.  It is not clearly exertional or related to any particular movement, though it seems to be a little more pronounced when she is watching television at night.  She has not had any significant dyspnea nor palpitations lightheadedness.  She often has mild swelling in her ankles, which is chronic.  She remains quite active at home and is currently caring for her daughter, who fractured her hip and collarbone, as well as her special needs grandson.  ROS: See HPI  Studies Reviewed: Marland Kitchen   EKG Interpretation Date/Time:  Wednesday December 06 2022 10:42:44 EDT Ventricular Rate:  65 PR Interval:  158 QRS Duration:  88 QT Interval:  412 QTC Calculation: 428 R Axis:   12  Text Interpretation: Normal sinus rhythm Normal ECG  When compared with ECG of 14-Nov-2022 10:03, Premature ventricular complexes are no longer Present Otherwise no significant change Confirmed by Kaladin Noseworthy, Cristal Deer [phone]) on 12/06/2022 10:53:49 AM    Coronary CTA (11/30/2022): Multivessel coronary artery disease with mild LMCA disease.  Severe proximal/mid LAD stenosis.  Mild LCx disease.  Moderate proximal RCA stenosis.  Coronary artery calcium score 2280.  CT FFR hemodynamically significant in the mid LAD and proximal RCA; borderline significant in the LCx.  Risk Assessment/Calculations:             Physical Exam:   VS:  BP 132/78 (BP Location: Left Arm, Patient Position: Sitting, Cuff Size: Normal)   Pulse 65   Ht 5\' 5"  (1.651 m)   Wt 173 lb (78.5 kg)   SpO2 97%   BMI 28.79 kg/m    Wt Readings from Last 3 Encounters:  12/06/22 173 lb (78.5 kg)  11/17/22 178 lb (80.7 kg)  11/14/22 173 lb 12.8 oz (78.8 kg)    General:  NAD. Neck: No JVD or HJR. Lungs: Clear to auscultation bilaterally without wheezes or crackles. Heart: Regular rate and rhythm without murmurs, rubs, or gallops. Abdomen: Soft, nontender, nondistended. Extremities: Trace ankle edema bilaterally.  2+ radial pulses bilaterally.  ASSESSMENT AND PLAN: .    Coronary artery disease with angina: Recent coronary CTA suggested severe 2-3 vessel CAD.  Given that Christine [name] is quite functional and had an episode of fairly pronounced typical chest pain earlier this month, I think it would be prudent to move forward with cardiac catheterization and  possible PCI.  The vague pain between her shoulder blades could certainly be an anginal equivalent as well or some noncardiac process.  Her EKG today does not show any acute ischemic changes.  We have discussed further evaluation options and have agreed to move forward with cardiac catheterization and possible PCI.  We will plan to do this on Monday at [facility], as her heavy coronary calcium burden may necessitate  atherectomy/lithotripsy if PCI is needed.  Though she is not eager to undergo CABG, if necessary, she would be amenable to this as she is still quite functional at age 19.  She endorses FULL CODE status for the procedure but notes that she would not wish to be on mechanical life support indefinitely.  In the meantime, we will continue current doses of aspirin and amlodipine.  I will add isosorbide mononitrate for additional antianginal therapy and also provide Christine [name] for prescription for as needed sublingual nitroglycerin.  Rosuvastatin should be continued for secondary prevention.  Hypertension: Blood pressure upper normal today.  As above, we will add isosorbide mononitrate 30 mg daily.  Continue current regimen of amlodipine, HCTZ, and losartan as well.  Hyperlipidemia: LDL well-controlled at 57 on last check earlier this month.  Triglycerides were mildly elevated at 174, albeit from a nonfasting sample.  Continue rosuvastatin 20 mg daily.    Informed Consent   Shared Decision Making/Informed Consent The risks [stroke (1 in 1000), death (1 in 1000), kidney failure [usually temporary] (1 in 500), bleeding (1 in 200), allergic reaction [possibly serious] (1 in 200)], benefits (diagnostic support and management of coronary artery disease) and alternatives of a cardiac catheterization were discussed in detail with Christine [name] and she is willing to proceed.     Dispo: Return to clinic in 3 weeks.  Signed, Yvonne Kendall, MD

## 2022-12-07 LAB — CBC
Hematocrit: 46 % (ref 34.0–46.6)
Hemoglobin: 15.4 g/dL (ref 11.1–15.9)
MCH: 30.3 pg (ref 26.6–33.0)
MCHC: 33.5 g/dL (ref 31.5–35.7)
MCV: 90 fL (ref 79–97)
Platelets: 203 10*3/uL (ref 150–450)
RBC: 5.09 x10E6/uL (ref 3.77–5.28)
RDW: 12.5 % (ref 11.7–15.4)
WBC: 7 10*3/uL (ref 3.4–10.8)

## 2022-12-07 LAB — BASIC METABOLIC PANEL
BUN/Creatinine Ratio: 13 (ref 12–28)
BUN: 12 mg/dL (ref 8–27)
CO2: 22 mmol/L (ref 20–29)
Calcium: 10.3 mg/dL (ref 8.7–10.3)
Chloride: 98 mmol/L (ref 96–106)
Creatinine, Ser: 0.91 mg/dL (ref 0.57–1.00)
Glucose: 109 mg/dL — ABNORMAL HIGH (ref 70–99)
Potassium: 4.5 mmol/L (ref 3.5–5.2)
Sodium: 138 mmol/L (ref 134–144)
eGFR: 61 mL/min/{1.73_m2} (ref >59)

## 2022-12-11 ENCOUNTER — Other Ambulatory Visit: Payer: Self-pay | Admitting: Physician Assistant

## 2022-12-11 ENCOUNTER — Encounter (HOSPITAL_COMMUNITY)
Admission: AD | Disposition: A | Discharge status: Skilled Nursing Facility | Payer: Self-pay | Source: Home / Self Care | Attending: Thoracic Surgery (Cardiothoracic Vascular Surgery)

## 2022-12-11 ENCOUNTER — Inpatient Hospital Stay (HOSPITAL_COMMUNITY)
Admission: AD | Admit: 2022-12-11 | Discharge: 2022-12-21 | DRG: 234 | Disposition: A | Discharge status: Skilled Nursing Facility | Payer: Medicare Other | Attending: Thoracic Surgery (Cardiothoracic Vascular Surgery) | Admitting: Thoracic Surgery (Cardiothoracic Vascular Surgery)

## 2022-12-11 ENCOUNTER — Encounter (HOSPITAL_COMMUNITY): Payer: Self-pay | Admitting: Internal Medicine

## 2022-12-11 ENCOUNTER — Other Ambulatory Visit: Payer: Self-pay

## 2022-12-11 DIAGNOSIS — I4892 Unspecified atrial flutter: Secondary | ICD-10-CM | POA: Diagnosis not present

## 2022-12-11 DIAGNOSIS — E785 Hyperlipidemia, unspecified: Secondary | ICD-10-CM | POA: Diagnosis present

## 2022-12-11 DIAGNOSIS — Z8249 Family history of ischemic heart disease and other diseases of the circulatory system: Secondary | ICD-10-CM | POA: Diagnosis not present

## 2022-12-11 DIAGNOSIS — D696 Thrombocytopenia, unspecified: Secondary | ICD-10-CM | POA: Diagnosis present

## 2022-12-11 DIAGNOSIS — I25118 Atherosclerotic heart disease of native coronary artery with other forms of angina pectoris: Secondary | ICD-10-CM | POA: Diagnosis not present

## 2022-12-11 DIAGNOSIS — I25119 Atherosclerotic heart disease of native coronary artery with unspecified angina pectoris: Secondary | ICD-10-CM | POA: Diagnosis not present

## 2022-12-11 DIAGNOSIS — M170 Bilateral primary osteoarthritis of knee: Secondary | ICD-10-CM | POA: Diagnosis not present

## 2022-12-11 DIAGNOSIS — I700 Atherosclerosis of aorta: Secondary | ICD-10-CM | POA: Diagnosis not present

## 2022-12-11 DIAGNOSIS — J9811 Atelectasis: Secondary | ICD-10-CM | POA: Diagnosis not present

## 2022-12-11 DIAGNOSIS — Z888 Allergy status to other drugs, medicaments and biological substances status: Secondary | ICD-10-CM | POA: Diagnosis not present

## 2022-12-11 DIAGNOSIS — J4520 Mild intermittent asthma, uncomplicated: Secondary | ICD-10-CM | POA: Diagnosis not present

## 2022-12-11 DIAGNOSIS — J90 Pleural effusion, not elsewhere classified: Secondary | ICD-10-CM | POA: Diagnosis not present

## 2022-12-11 DIAGNOSIS — F419 Anxiety disorder, unspecified: Secondary | ICD-10-CM

## 2022-12-11 DIAGNOSIS — I25110 Atherosclerotic heart disease of native coronary artery with unstable angina pectoris: Principal | ICD-10-CM | POA: Diagnosis present

## 2022-12-11 DIAGNOSIS — Z48812 Encounter for surgical aftercare following surgery on the circulatory system: Secondary | ICD-10-CM | POA: Diagnosis not present

## 2022-12-11 DIAGNOSIS — K219 Gastro-esophageal reflux disease without esophagitis: Secondary | ICD-10-CM | POA: Diagnosis present

## 2022-12-11 DIAGNOSIS — E871 Hypo-osmolality and hyponatremia: Secondary | ICD-10-CM | POA: Diagnosis present

## 2022-12-11 DIAGNOSIS — E663 Overweight: Secondary | ICD-10-CM | POA: Diagnosis not present

## 2022-12-11 DIAGNOSIS — R918 Other nonspecific abnormal finding of lung field: Secondary | ICD-10-CM | POA: Diagnosis not present

## 2022-12-11 DIAGNOSIS — I201 Angina pectoris with documented spasm: Secondary | ICD-10-CM | POA: Diagnosis not present

## 2022-12-11 DIAGNOSIS — Z743 Need for continuous supervision: Secondary | ICD-10-CM | POA: Diagnosis not present

## 2022-12-11 DIAGNOSIS — I480 Paroxysmal atrial fibrillation: Secondary | ICD-10-CM | POA: Diagnosis present

## 2022-12-11 DIAGNOSIS — Z7982 Long term (current) use of aspirin: Secondary | ICD-10-CM

## 2022-12-11 DIAGNOSIS — I10 Essential (primary) hypertension: Secondary | ICD-10-CM | POA: Diagnosis present

## 2022-12-11 DIAGNOSIS — Z841 Family history of disorders of kidney and ureter: Secondary | ICD-10-CM | POA: Diagnosis not present

## 2022-12-11 DIAGNOSIS — I351 Nonrheumatic aortic (valve) insufficiency: Secondary | ICD-10-CM | POA: Diagnosis not present

## 2022-12-11 DIAGNOSIS — K5900 Constipation, unspecified: Secondary | ICD-10-CM | POA: Diagnosis not present

## 2022-12-11 DIAGNOSIS — J45909 Unspecified asthma, uncomplicated: Secondary | ICD-10-CM | POA: Diagnosis present

## 2022-12-11 DIAGNOSIS — E46 Unspecified protein-calorie malnutrition: Secondary | ICD-10-CM | POA: Diagnosis not present

## 2022-12-11 DIAGNOSIS — Z9013 Acquired absence of bilateral breasts and nipples: Secondary | ICD-10-CM | POA: Diagnosis not present

## 2022-12-11 DIAGNOSIS — Z79890 Hormone replacement therapy (postmenopausal): Secondary | ICD-10-CM | POA: Diagnosis not present

## 2022-12-11 DIAGNOSIS — Z4682 Encounter for fitting and adjustment of non-vascular catheter: Secondary | ICD-10-CM | POA: Diagnosis not present

## 2022-12-11 DIAGNOSIS — R519 Headache, unspecified: Secondary | ICD-10-CM | POA: Diagnosis not present

## 2022-12-11 DIAGNOSIS — D62 Acute posthemorrhagic anemia: Secondary | ICD-10-CM | POA: Diagnosis not present

## 2022-12-11 DIAGNOSIS — E039 Hypothyroidism, unspecified: Secondary | ICD-10-CM | POA: Diagnosis not present

## 2022-12-11 DIAGNOSIS — H6991 Unspecified Eustachian tube disorder, right ear: Secondary | ICD-10-CM | POA: Diagnosis not present

## 2022-12-11 DIAGNOSIS — E8770 Fluid overload, unspecified: Secondary | ICD-10-CM | POA: Diagnosis not present

## 2022-12-11 DIAGNOSIS — M542 Cervicalgia: Secondary | ICD-10-CM | POA: Diagnosis not present

## 2022-12-11 DIAGNOSIS — Z1152 Encounter for screening for COVID-19: Secondary | ICD-10-CM

## 2022-12-11 DIAGNOSIS — R0602 Shortness of breath: Secondary | ICD-10-CM | POA: Diagnosis not present

## 2022-12-11 DIAGNOSIS — Z90710 Acquired absence of both cervix and uterus: Secondary | ICD-10-CM | POA: Diagnosis not present

## 2022-12-11 DIAGNOSIS — Z79899 Other long term (current) drug therapy: Secondary | ICD-10-CM

## 2022-12-11 DIAGNOSIS — Z951 Presence of aortocoronary bypass graft: Secondary | ICD-10-CM | POA: Diagnosis not present

## 2022-12-11 DIAGNOSIS — I200 Unstable angina: Secondary | ICD-10-CM | POA: Diagnosis not present

## 2022-12-11 DIAGNOSIS — R053 Chronic cough: Secondary | ICD-10-CM | POA: Diagnosis not present

## 2022-12-11 DIAGNOSIS — Z806 Family history of leukemia: Secondary | ICD-10-CM | POA: Diagnosis not present

## 2022-12-11 DIAGNOSIS — I358 Other nonrheumatic aortic valve disorders: Secondary | ICD-10-CM | POA: Diagnosis not present

## 2022-12-11 DIAGNOSIS — Z4889 Encounter for other specified surgical aftercare: Secondary | ICD-10-CM | POA: Diagnosis not present

## 2022-12-11 DIAGNOSIS — I6523 Occlusion and stenosis of bilateral carotid arteries: Secondary | ICD-10-CM | POA: Diagnosis not present

## 2022-12-11 DIAGNOSIS — I499 Cardiac arrhythmia, unspecified: Secondary | ICD-10-CM | POA: Diagnosis not present

## 2022-12-11 DIAGNOSIS — M40204 Unspecified kyphosis, thoracic region: Secondary | ICD-10-CM | POA: Diagnosis not present

## 2022-12-11 DIAGNOSIS — F32A Depression, unspecified: Secondary | ICD-10-CM | POA: Diagnosis not present

## 2022-12-11 DIAGNOSIS — T7840XD Allergy, unspecified, subsequent encounter: Secondary | ICD-10-CM | POA: Diagnosis not present

## 2022-12-11 DIAGNOSIS — I2510 Atherosclerotic heart disease of native coronary artery without angina pectoris: Secondary | ICD-10-CM | POA: Diagnosis not present

## 2022-12-11 DIAGNOSIS — R110 Nausea: Secondary | ICD-10-CM | POA: Diagnosis not present

## 2022-12-11 HISTORY — PX: LEFT HEART CATH AND CORONARY ANGIOGRAPHY: CATH118249

## 2022-12-11 SURGERY — LEFT HEART CATH AND CORONARY ANGIOGRAPHY
Anesthesia: LOCAL

## 2022-12-11 MED ORDER — SODIUM CHLORIDE 0.9% FLUSH
3.0000 mL | Freq: Two times a day (BID) | INTRAVENOUS | Status: DC
  Administered 2022-12-12 (×2): 3 mL via INTRAVENOUS

## 2022-12-11 MED ORDER — SODIUM CHLORIDE 0.9% FLUSH: 3.0000 mL | INTRAVENOUS | Status: DC | PRN

## 2022-12-11 MED ORDER — FENTANYL CITRATE (PF) 100 MCG/2ML IJ SOLN
INTRAMUSCULAR | Status: AC
  Filled 2022-12-11: qty 2

## 2022-12-11 MED ORDER — ALBUTEROL SULFATE (2.5 MG/3ML) 0.083% IN NEBU: 3.0000 mL | INHALATION_SOLUTION | Freq: Four times a day (QID) | RESPIRATORY_TRACT | Status: DC | PRN

## 2022-12-11 MED ORDER — TIZANIDINE HCL 2 MG PO TABS: 2.0000 mg | ORAL_TABLET | Freq: Every evening | ORAL | Status: DC | PRN

## 2022-12-11 MED ORDER — SODIUM CHLORIDE 0.9 % WEIGHT BASED INFUSION: 1.0000 mL/kg/h | INTRAVENOUS | Status: DC

## 2022-12-11 MED ORDER — SODIUM CHLORIDE 0.9 % IV SOLN: 250.0000 mL | INTRAVENOUS | Status: DC | PRN

## 2022-12-11 MED ORDER — HYDRALAZINE HCL 20 MG/ML IJ SOLN: 10.0000 mg | INTRAMUSCULAR | Status: AC | PRN

## 2022-12-11 MED ORDER — VERAPAMIL HCL 2.5 MG/ML IV SOLN
INTRAVENOUS | Status: AC
  Filled 2022-12-11: qty 2

## 2022-12-11 MED ORDER — FLUTICASONE FUROATE-VILANTEROL 200-25 MCG/ACT IN AEPB
1.0000 | INHALATION_SPRAY | Freq: Every day | RESPIRATORY_TRACT | Status: DC
  Administered 2022-12-12: 1 via RESPIRATORY_TRACT
  Filled 2022-12-11: qty 28

## 2022-12-11 MED ORDER — LIDOCAINE HCL (PF) 1 % IJ SOLN
INTRAMUSCULAR | Status: AC
  Filled 2022-12-11: qty 30

## 2022-12-11 MED ORDER — LIDOCAINE HCL (PF) 1 % IJ SOLN
INTRAMUSCULAR | Status: DC | PRN
  Administered 2022-12-11: 2 mL

## 2022-12-11 MED ORDER — HYDROCHLOROTHIAZIDE 25 MG PO TABS
25.0000 mg | ORAL_TABLET | Freq: Every day | ORAL | Status: DC
  Administered 2022-12-12: 25 mg via ORAL
  Filled 2022-12-11: qty 1

## 2022-12-11 MED ORDER — NITROGLYCERIN 0.4 MG SL SUBL: 0.4000 mg | SUBLINGUAL_TABLET | SUBLINGUAL | Status: DC | PRN

## 2022-12-11 MED ORDER — ONDANSETRON HCL 4 MG/2ML IJ SOLN: 4.0000 mg | Freq: Four times a day (QID) | INTRAMUSCULAR | Status: DC | PRN

## 2022-12-11 MED ORDER — VERAPAMIL HCL 2.5 MG/ML IV SOLN
INTRAVENOUS | Status: DC | PRN
  Administered 2022-12-11: 10 mL via INTRA_ARTERIAL

## 2022-12-11 MED ORDER — METOPROLOL TARTRATE 12.5 MG HALF TABLET
12.5000 mg | ORAL_TABLET | Freq: Two times a day (BID) | ORAL | Status: DC
  Administered 2022-12-11 – 2022-12-12 (×3): 12.5 mg via ORAL
  Filled 2022-12-11 (×3): qty 1

## 2022-12-11 MED ORDER — ROSUVASTATIN CALCIUM 20 MG PO TABS
20.0000 mg | ORAL_TABLET | Freq: Every day | ORAL | Status: DC
  Administered 2022-12-12: 20 mg via ORAL
  Filled 2022-12-11: qty 1

## 2022-12-11 MED ORDER — FENTANYL CITRATE (PF) 100 MCG/2ML IJ SOLN
INTRAMUSCULAR | Status: DC | PRN
  Administered 2022-12-11: 25 ug via INTRAVENOUS

## 2022-12-11 MED ORDER — ASPIRIN 81 MG PO TBEC
81.0000 mg | DELAYED_RELEASE_TABLET | Freq: Every day | ORAL | Status: DC
  Administered 2022-12-12: 81 mg via ORAL
  Filled 2022-12-11: qty 1

## 2022-12-11 MED ORDER — LEVOTHYROXINE SODIUM 75 MCG PO TABS
75.0000 ug | ORAL_TABLET | Freq: Every day | ORAL | Status: DC
  Administered 2022-12-12 – 2022-12-13 (×2): 75 ug via ORAL
  Filled 2022-12-11 (×2): qty 1

## 2022-12-11 MED ORDER — ESCITALOPRAM OXALATE 10 MG PO TABS
10.0000 mg | ORAL_TABLET | ORAL | Status: DC
  Administered 2022-12-11: 10 mg via ORAL
  Filled 2022-12-11: qty 1

## 2022-12-11 MED ORDER — ASPIRIN 81 MG PO CHEW: 81.0000 mg | CHEWABLE_TABLET | ORAL | Status: DC

## 2022-12-11 MED ORDER — MIDAZOLAM HCL 2 MG/2ML IJ SOLN
INTRAMUSCULAR | Status: AC
  Filled 2022-12-11: qty 2

## 2022-12-11 MED ORDER — ENOXAPARIN SODIUM 40 MG/0.4ML IJ SOSY
40.0000 mg | PREFILLED_SYRINGE | INTRAMUSCULAR | Status: DC
  Administered 2022-12-12: 40 mg via SUBCUTANEOUS
  Filled 2022-12-11: qty 0.4

## 2022-12-11 MED ORDER — SODIUM CHLORIDE 0.9 % IV SOLN: INTRAVENOUS | Status: AC

## 2022-12-11 MED ORDER — HEPARIN SODIUM (PORCINE) 1000 UNIT/ML IJ SOLN
INTRAMUSCULAR | Status: DC | PRN
  Administered 2022-12-11: 4000 [IU] via INTRAVENOUS

## 2022-12-11 MED ORDER — ACETAMINOPHEN 325 MG PO TABS: 650.0000 mg | ORAL_TABLET | ORAL | Status: DC | PRN

## 2022-12-11 MED ORDER — LOSARTAN POTASSIUM 50 MG PO TABS
50.0000 mg | ORAL_TABLET | Freq: Every day | ORAL | Status: DC
  Administered 2022-12-12: 50 mg via ORAL
  Filled 2022-12-11: qty 1

## 2022-12-11 MED ORDER — MIDAZOLAM HCL 2 MG/2ML IJ SOLN
INTRAMUSCULAR | Status: DC | PRN
  Administered 2022-12-11: 1 mg via INTRAVENOUS

## 2022-12-11 MED ORDER — PANTOPRAZOLE SODIUM 40 MG PO TBEC
40.0000 mg | DELAYED_RELEASE_TABLET | Freq: Every day | ORAL | Status: DC
  Administered 2022-12-12: 40 mg via ORAL
  Filled 2022-12-11: qty 1

## 2022-12-11 MED ORDER — HEPARIN SODIUM (PORCINE) 1000 UNIT/ML IJ SOLN
INTRAMUSCULAR | Status: AC
  Filled 2022-12-11: qty 10

## 2022-12-11 MED ORDER — LABETALOL HCL 5 MG/ML IV SOLN: 10.0000 mg | INTRAVENOUS | Status: AC | PRN

## 2022-12-11 MED ORDER — IOHEXOL 350 MG/ML SOLN
INTRAVENOUS | Status: DC | PRN
  Administered 2022-12-11: 50 mL

## 2022-12-11 MED ORDER — AMLODIPINE BESYLATE 5 MG PO TABS
5.0000 mg | ORAL_TABLET | Freq: Every day | ORAL | Status: DC
  Administered 2022-12-12: 5 mg via ORAL
  Filled 2022-12-11: qty 1

## 2022-12-11 MED ORDER — SODIUM CHLORIDE 0.9 % WEIGHT BASED INFUSION
3.0000 mL/kg/h | INTRAVENOUS | Status: DC
  Administered 2022-12-11: 3 mL/kg/h via INTRAVENOUS

## 2022-12-11 MED ORDER — ISOSORBIDE MONONITRATE ER 30 MG PO TB24
30.0000 mg | ORAL_TABLET | Freq: Every day | ORAL | Status: DC
  Administered 2022-12-12: 30 mg via ORAL
  Filled 2022-12-11: qty 1

## 2022-12-11 SURGICAL SUPPLY — 8 items
CATH 5FR JL3.5 JR4 ANG PIG MP (CATHETERS) ×1
DEVICE RAD TR BAND REGULAR (VASCULAR PRODUCTS) ×1
ELECT DEFIB PAD ADLT CADENCE (PAD) ×1
GLIDESHEATH SLEND SS 6F .021 (SHEATH) ×1
INQWIRE 1.5J .035X260CM (WIRE) ×1
PACK CARDIAC CATHETERIZATION (CUSTOM PROCEDURE TRAY) ×1
PROTECTION STATION PRESSURIZED (MISCELLANEOUS) ×1
SET ATX-X65L (MISCELLANEOUS) ×1

## 2022-12-11 NOTE — Interval H&P Note (Signed)
History and Physical Interval Note:  12/11/2022 1:09 PM  Christine [name]  has presented today for surgery, with the diagnosis of chest pain and abnormal coronary CTA.  The various methods of treatment have been discussed with the patient and family. After consideration of risks, benefits and other options for treatment, the patient has consented to  Procedure(s): LEFT HEART CATH AND CORONARY ANGIOGRAPHY (N/A) as a surgical intervention.  The patient's history has been reviewed, patient examined, no change in status, stable for surgery.  I have reviewed the patient's chart and labs.  Questions were answered to the patient's satisfaction.    Cath Lab Visit (complete for each Cath Lab visit)  Clinical Evaluation Leading to the Procedure:   ACS: No.  Non-ACS:    Anginal Classification: CCS IV  Anti-ischemic medical therapy: Minimal Therapy (1 class of medications)  Non-Invasive Test Results: Coronary CTA with severe ostial/proximal LAD disease that is significant by CTFFR->high risk  Prior CABG: No previous CABG        Christine [name]

## 2022-12-11 NOTE — Plan of Care (Signed)

## 2022-12-11 NOTE — H&P (View-Only) (Signed)
[address]       [place] [address]             [phone]        Luana Tatro [facility] Medical Record #[id number] Date of Birth: [birth year]  Referring: No ref. provider found Primary Care: Eden Emms, NP Primary Cardiologist:Christopher End, MD  Reason for consult:  Evaluation for potential surgical management of multivessel coronary artery disease   History of Present Illness:     Mrs. Brewer HD 16 days of 89 year old female with past history of  known coronary artery disease with a history of angina, carotid artery stenosis, dyslipidemia, hypertension, and acquired hypothyroidism.  She had an abnormal coronary CTA in 2021 and has been followed by her cardiologist regularly since then.  She was last seen by Eula Listen, PA-C about 3 weeks ago after having an episode of chest pressure while at church a few days before.  EKG and high-sensitivity troponin were normal at that visit.  Follow-up coronary CTA was ordered showing elevated calcium score of 2080 and evidence of significant coronary stenoses in the proximal to mid LAD and proximal RCA.  Given these findings, left heart catheterization was recommended and was done electively earlier today confirming advanced three-vessel coronary artery disease.  An echocardiogram obtained about 1 year ago showed normal biventricular function.  There was evidence of aortic sclerosis without aortic valve stenosis.  There was mild mitral insufficiency.  We have been asked to evaluate Ms. Dolson for possible coronary bypass grafting for multivessel coronary artery disease presenting stable angina pectoris.  Ms. Prim recently relocated from [place] to [facility]. She remains quite active and currently assists in caring for a special needs grandson and also for her daughter who recently sustained orthopedic injuries.  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  She is currently comfortable, free of any chest pain or shortness of breath.   Current Activity/ Functional Status: Patient is independent with mobility/ambulation, transfers, ADL's, IADL's.   Zubrod Score: At the time of surgery this patient's most appropriate activity status/level should be described as: []     0    Normal activity, no symptoms [x]     1    Restricted in physical strenuous activity but ambulatory, able to do out light work []     2    Ambulatory and capable of self care, unable to do work activities, up and about                 more than 50%  Of the time                            []     3    Only limited self care, in bed greater than 50% of waking hours []     4    Completely disabled, no self care, confined to bed or chair []     5    Moribund  Past Medical History:  Diagnosis Date   Asthma    Carotid arterial disease (HCC)    a. 06/2019 Carotid U/S: RICA 50-69%. Mod LICA plaque.   Chicken pox    Diastolic dysfunction    a. 02/2020 Echo: EF 60-65%, no rwma, GrI DD. Nl RV size/fxn. Triv MR. Mild-mod Ao sclerosis w/o stenois.   Frequent headaches    GERD (gastroesophageal reflux disease)  Heart murmur    a. 02/2020 Echo: Triv MR. Mild-mod Ao sclerosis w/o stenosis.   Hyperlipidemia    Hypertension    Hypothyroidism    Nonobstructive CAD (coronary artery disease)    a.  Remote Cath in [facility] - reportedly nonobs dzs; b. 03/2020 Cor CTA:  Ca2+ = 1844 (94th %'ile). LAD 50p, RCA 25-49 (FFRct nl in prox RCA w/ slow taper in mid-dist segments from 0.8-->0.6).   Rheumatic fever    Urine incontinence     Past Surgical History:  Procedure Laterality Date   ABDOMINAL HYSTERECTOMY     APPENDECTOMY     BREAST BIOPSY     MASTECTOMY Bilateral 04/1979    Social History   Tobacco Use  Smoking Status Never  Smokeless Tobacco Never    Social History   Substance and Sexual Activity  Alcohol Use Yes   Comment: maybe once a year     Allergies  Allergen  Reactions   Atorvastatin     Myalgias    Pollen Extract     Sneezing, coughing     Current Facility-Administered Medications  Medication Dose Route Frequency Provider Last Rate Last Admin   0.9% sodium chloride infusion  1 mL/kg/hr Intravenous Continuous End, Christopher, MD 78.5 mL/hr at 12/11/22 0930 1 mL/kg/hr at 12/11/22 0930   aspirin chewable tablet 81 mg  81 mg Oral Pre-Cath End, Christopher, MD       fentaNYL (SUBLIMAZE) injection    PRN End, Cristal Deer, MD   25 mcg at 12/11/22 1330   heparin sodium (porcine) injection    PRN End, Cristal Deer, MD   4,000 Units at 12/11/22 1339   iohexol (OMNIPAQUE) 350 MG/ML injection    PRN End, Cristal Deer, MD   50 mL at 12/11/22 1353   lidocaine (PF) (XYLOCAINE) 1 % injection    PRN End, Cristal Deer, MD   2 mL at 12/11/22 1336   midazolam (VERSED) injection    PRN End, Cristal Deer, MD   1 mg at 12/11/22 1330   Radial Cocktail/Verapamil only    PRN End, Cristal Deer, MD   10 mL at 12/11/22 1337   Radial Cocktail/Verapamil only    PRN End, Cristal Deer, MD   10 mL at 12/11/22 1353    Medications Prior to Admission  Medication Sig Dispense Refill Last Dose   albuterol (VENTOLIN HFA) 108 (90 Base) MCG/ACT inhaler Inhale 2 puffs into the lungs every 6 (six) hours as needed for wheezing or shortness of breath. 8 g 2 12/11/2022   amLODipine (NORVASC) 5 MG tablet TAKE 1 TABLET DAILY 90 tablet 0 12/11/2022   aspirin EC 81 MG tablet Take 1 tablet (81 mg total) by mouth daily. Swallow whole. 90 tablet 3 12/11/2022   escitalopram (LEXAPRO) 10 MG tablet Take 1 tablet (10 mg total) by mouth daily. (Patient taking differently: Take 10 mg by mouth 2 (two) times a week.) 90 tablet 0 Past Week   fluticasone (FLONASE) 50 MCG/ACT nasal spray Place 1 spray into both nostrils daily as needed for allergies or rhinitis.   12/10/2022   fluticasone-salmeterol (WIXELA INHUB) 250-50 MCG/ACT AEPB Inhale 1 puff into the lungs in the morning and at bedtime.   Past Week    hydrochlorothiazide (HYDRODIURIL) 25 MG tablet Take 1 tablet (25 mg total) by mouth daily. PLEASE CALL OFFICE TO SCHEDULE APPOINTMENT PRIOR TO NEXT REFILL 90 tablet 0 12/10/2022   isosorbide mononitrate (IMDUR) 30 MG 24 hr tablet Take 1 tablet (30 mg total) by mouth daily. 90 tablet 3  12/11/2022   levothyroxine (SYNTHROID) 75 MCG tablet Take 1 tablet (75 mcg total) by mouth daily. 30 tablet 0 12/11/2022   losartan (COZAAR) 50 MG tablet Take 1 tablet (50 mg total) by mouth daily. 90 tablet 3 12/11/2022   RABEprazole (ACIPHEX) 20 MG tablet TAKE 1 TABLET DAILY 90 tablet 3 12/11/2022   rosuvastatin (CRESTOR) 20 MG tablet TAKE 1 TABLET DAILY 90 tablet 0 12/11/2022   tiZANidine (ZANAFLEX) 2 MG tablet Take 1 tablet (2 mg total) by mouth at bedtime as needed for muscle spasms. 30 tablet 1 Past Week   loratadine (CLARITIN) 10 MG tablet Take 10 mg by mouth daily as needed for allergies.   More than a month   nitroGLYCERIN (NITROSTAT) 0.4 MG SL tablet Place 1 tablet (0.4 mg total) under the tongue every 5 (five) minutes as needed. 25 tablet 3 More than a month    Family History  Problem Relation Age of Onset   Early death Mother 86   Kidney disease Mother        bright disease   Other Father        tumor on the side of his leg   Heart disease Brother    Heart attack Brother 44   Heart disease Brother 102   Leukemia Daughter    Valvular heart disease Daughter        s/p mitral valve replacement     Review of Systems:     Cardiac Review of Systems: Y or  [    ]= no  Chest Pain [  x  ]  Resting SOB [   ] Exertional SOB  [  ]  Orthopnea [  ]   Pedal Edema [   ]    Palpitations [  ] Syncope  [  ]   Presyncope [   ]  General Review of Systems: [Y] = yes [  ]=no Constitional: recent weight change [  ]; anorexia [  ]; fatigue [  ]; nausea [  ]; night sweats [  ]; fever [  ]; or chills [  ]                                                               Dental: Last Dentist visit:   Eye : blurred vision [  ];  diplopia [   ]; vision changes [  ];  Amaurosis fugax[  ]; Resp: cough [  ];  wheezing[  ];  hemoptysis[  ]; shortness of breath[  ]; paroxysmal nocturnal dyspnea[  ]; dyspnea on exertion[  ]; or orthopnea[  ];  GI:  gallstones[  ], vomiting[  ];  dysphagia[  ]; melena[  ];  hematochezia [  ]; heartburn[  ];   Hx of  Colonoscopy[  ]; GU: kidney stones [  ]; hematuria[  ];   dysuria [  ];  nocturia[  ];  history of     obstruction [  ]; urinary frequency [  ]             Skin: rash, swelling[  ];, hair loss[  ];  peripheral edema[  ];  or itching[  ]; Musculosketetal: myalgias[  ];  joint swelling[  ];  joint erythema[  ];  joint pain[  ];  back pain[  ];  Heme/Lymph: bruising[  ];  bleeding[  ];  anemia[  ];  Neuro: TIA[  ];  headaches[  ];  stroke[  ];  vertigo[  ];  seizures[  ];   paresthesias[  ];  difficulty walking[  ];  Psych:depression[  ]; anxiety[  ];  Endocrine: diabetes[  ];  thyroid dysfunction[  ];    Physical Exam: BP (!) 150/69   Pulse (!) 58   Temp (!) 97.4 F (36.3 C) (Temporal)   Resp 14   Ht 5\' 5"  (1.651 m)   Wt 78.5 kg   SpO2 93%   BMI 28.79 kg/m    General appearance: alert, cooperative, and no distress Head: Normocephalic, without obvious abnormality, atraumatic Neck: no adenopathy, no carotid bruit, no JVD, and supple, symmetrical, trachea midline Lymph nodes: No cervical or clavicular adenopathy Resp: clear to auscultation bilaterally Cardio: Regular rhythm without murmur GI: Soft, no tenderness. Extremities: No deformities.  All warm and well-perfused.  She has palpable pedal pulses bilaterally.  There is a TR band over the right radial artery Neurologic: Grossly normal  Diagnostic Studies & Laboratory data:  LEFT HEART CATH AND CORONARY ANGIOGRAPHY   Conclusion  Conclusions: Significant multivessel coronary artery disease, including sequential 60% ostial and 50% mid LAD stenoses, complex ostial LCx stenosis involving trifurcation of OM1, OM2, and  LCx proper with hazy 90% stenosis, and diffuse calcified RCA disease of up to 70%. Normal left ventricular systolic function (LVEF 55-65%) and filling pressure (LVEDP 10 mmHg).   Recommendations: Given severe multivessel CAD and continued vague chest pain at rest and with activity, we will admit [name] for medical optimization and cardiac surgery consultation for CABG. Continue aggressive secondary prevention of coronary artery disease. Obtain echocardiogram.   Yvonne Kendall, MD [facility]  Coronary Findings  Diagnostic Dominance: Right Left Main  Vessel is large.    Left Anterior Descending  Ost LAD lesion is 60% stenosed. The lesion is calcified.  Mid LAD-1 lesion is 20% stenosed.  Mid LAD-2 lesion is 50% stenosed.    First Diagonal Branch  Vessel is moderate in size.  1st Diag lesion is 50% stenosed.    Second Diagonal Branch  Vessel is moderate in size.    Left Circumflex  Vessel is large.  Ost Cx to Prox Cx lesion is 90% stenosed.  Prox Cx lesion is 20% stenosed.    First Obtuse Marginal Branch  Vessel is moderate in size.    Second Obtuse Marginal Branch  Vessel is moderate in size.  2nd Mrg lesion is 80% stenosed.    Third Obtuse Marginal Branch  Vessel is small in size.    Fourth Obtuse Marginal Branch  Vessel is small in size.    Right Coronary Artery  Vessel is moderate in size. There is moderate diffuse disease throughout the vessel.  Prox RCA to Mid RCA lesion is 70% stenosed. The lesion is eccentric. The lesion is moderately calcified.  Mid RCA lesion is 70% stenosed.    Right Posterior Descending Artery  Vessel is moderate in size.    Right Posterior Atrioventricular Artery  Vessel is moderate in size.    First Right Posterolateral Branch  Vessel is moderate in size.    Intervention   No interventions have been documented.   Left Heart  Left Ventricle The left ventricular size is normal. The left ventricular systolic  function is normal. LV end diastolic pressure is normal. LVEDP 10 mmHg. The left ventricular ejection  fraction is 55-65% by visual estimate.  Aortic Valve There is no aortic valve stenosis.   Coronary Diagrams  Diagnostic Dominance: Right   CLINICAL DATA:  Chest pain and shortness of breath   EXAM: Cardiac/Coronary  CTA   TECHNIQUE: The patient was scanned on a Siemens Somatom go.Top scanner.   : A retrospective scan was triggered in the ascending thoracic aorta. Axial non-contrast 3 mm slices were carried out through the heart. The data set was analyzed on a dedicated work station and scored using the Agatson method. Gantry rotation speed was 330 msecs and collimation was .6 mm. 50mg  of metoprolol and 0.8 mg of sl NTG was given. The 3D data set was reconstructed in 5% intervals of the 60-95 % of the R-R cycle. Diastolic phases were analyzed on a dedicated work station using MPR, MIP and VRT modes. The patient received 75 cc of contrast.   FINDINGS: Aorta:  Normal size.  Aortic wall calcifications.  No dissection.   Aortic Valve:  Trileaflet.  No calcifications.   Coronary Arteries:  Normal coronary origin.  Right dominance.   RCA is a dominant artery. There is calcified plaque proximally causing moderate stenosis (50-69%).   Left main gives rise to LAD and LCX arteries. LM has calcified plaque proximally causing mild stenosis (25-49%).   LAD has heavily calcified plaque proximally causing severe stenosis (>70%).   LCX is a non-dominant artery. There is calcified plaque proximally causing mild stenosis (25-49%).   Other findings:   Normal pulmonary vein drainage into the left atrium.   Normal left atrial appendage without a thrombus.   Normal size of the pulmonary artery.   IMPRESSION: 1. Coronary calcium score of 2280.   2. Normal coronary origin with right dominance.   3. Severe proximal-mid LAD stenosis (>70%).   4. Moderate proximal RCA stenosis  (50-69%).   5. Mild LM and LCx stenosis (25-49%).   6. CAD-RADS 4 Severe stenosis. (70-99% or > 50% left main). Cardiac catheterization is recommended. Consider symptom-guided anti-ischemic pharmacotherapy as well as risk factor modification per guideline directed care.   7. Additional analysis with CT FFR will be submitted and reported separately.     Electronically Signed   By: Debbe Odea M.D.   On: 11/30/2022 14:44    ECHOCARDIOGRAM REPORT     Patient Name:   ZAN TRISKA Date of Exam: 11/15/2021  Medical Rec #:  [id number]         Height:       65.0 in  Accession #:    [id number]        Weight:       190.6 lb  Date of Birth:  Jul 14, 1933         BSA:          1.938 m  Patient Age:    88 years          BP:           130/72 mmHg  Patient Gender: F                 HR:           64 bpm.  Exam Location:  [place]   Procedure: 2D Echo, Cardiac Doppler and Color Doppler   Indications:    R06.9 DOE    History:        Patient has prior history of Echocardiogram examinations,  most  recent 03/09/2020. TIA and Carotid Disease; Risk                  Factors:Hypertension, Dyslipidemia and Non-Smoker.    Sonographer:    Dondra Prader RVT  Referring Phys: [id number] Raymon Mutton DUNN     Sonographer Comments: Suboptimal parasternal window.  IMPRESSIONS     1. Left ventricular ejection fraction, by estimation, is 60 to 65%. The  left ventricle has normal function. The left ventricle has no regional  wall motion abnormalities. Left ventricular diastolic parameters are  consistent with Grade II diastolic  dysfunction (pseudonormalization).   2. Right ventricular systolic function is normal. The right ventricular  size is normal.   3. The mitral valve is normal in structure. Mild mitral valve  regurgitation. No evidence of mitral stenosis.   4. The aortic valve is normal in structure. Aortic valve regurgitation is  not visualized. No aortic stenosis is present.    5. The inferior vena cava is normal in size with greater than 50%  respiratory variability, suggesting right atrial pressure of 3 mmHg.   Comparison(s): 03/09/20 EF 60-65%, no rwma, GrI DD. Nl RV size/fxn. Triv  MR. Mild-mod Ao sclerosis w/o stenois.   FINDINGS   Left Ventricle: Left ventricular ejection fraction, by estimation, is 60  to 65%. The left ventricle has normal function. The left ventricle has no  regional wall motion abnormalities. The left ventricular internal cavity  size was normal in size. There is   no left ventricular hypertrophy. Left ventricular diastolic parameters  are consistent with Grade II diastolic dysfunction (pseudonormalization).   Right Ventricle: The right ventricular size is normal. No increase in  right ventricular wall thickness. Right ventricular systolic function is  normal.   Left Atrium: Left atrial size was normal in size.   Right Atrium: Right atrial size was normal in size.   Pericardium: There is no evidence of pericardial effusion.   Mitral Valve: The mitral valve is normal in structure. Mild mitral valve  regurgitation. No evidence of mitral valve stenosis.   Tricuspid Valve: The tricuspid valve is normal in structure. Tricuspid  valve regurgitation is not demonstrated. No evidence of tricuspid  stenosis.   Aortic Valve: The aortic valve is normal in structure. Aortic valve  regurgitation is not visualized. No aortic stenosis is present. Aortic  valve mean gradient measures 3.0 mmHg. Aortic valve peak gradient measures  4.9 mmHg. Aortic valve area, by VTI  measures 1.95 cm.   Pulmonic Valve: The pulmonic valve was normal in structure. Pulmonic valve  regurgitation is not visualized. No evidence of pulmonic stenosis.   Aorta: The aortic root is normal in size and structure.   Venous: The inferior vena cava is normal in size with greater than 50%  respiratory variability, suggesting right atrial pressure of 3 mmHg.    IAS/Shunts: No atrial level shunt detected by color flow Doppler.     LEFT VENTRICLE  PLAX 2D  LVIDd:         4.50 cm   Diastology  LVIDs:         2.50 cm   LV e' medial:    8.38 cm/s  LV PW:         1.20 cm   LV E/e' medial:  11.8  LV IVS:        1.20 cm   LV e' lateral:   7.83 cm/s  LVOT diam:     1.80 cm   LV E/e' lateral: 12.6  LV SV:         56  LV SV Index:   29  LVOT Area:     2.54 cm     RIGHT VENTRICLE             IVC  RV Basal diam:  4.30 cm     IVC diam: 1.50 cm  RV Mid diam:    3.50 cm  RV S prime:     20.00 cm/s  TAPSE (M-mode): 2.4 cm   LEFT ATRIUM             Index        RIGHT ATRIUM           Index  LA diam:        3.70 cm 1.91 cm/m   RA Area:     12.40 cm  LA Vol (A2C):   59.1 ml 30.49 ml/m  RA Volume:   33.90 ml  17.49 ml/m  LA Vol (A4C):   53.2 ml 27.45 ml/m  LA Biplane Vol: 58.6 ml 30.24 ml/m   AORTIC VALVE                    PULMONIC VALVE  AV Area (Vmax):    1.87 cm     PV Vmax:       1.15 m/s  AV Area (Vmean):   1.81 cm     PV Peak grad:  5.3 mmHg  AV Area (VTI):     1.95 cm  AV Vmax:           111.00 cm/s  AV Vmean:          76.700 cm/s  AV VTI:            0.288 m  AV Peak Grad:      4.9 mmHg  AV Mean Grad:      3.0 mmHg  LVOT Vmax:         81.70 cm/s  LVOT Vmean:        54.700 cm/s  LVOT VTI:          0.221 m  LVOT/AV VTI ratio: 0.77    AORTA  Ao Root diam: 3.30 cm  Ao Asc diam:  3.30 cm  Ao Arch diam: 2.8 cm   MITRAL VALVE  MV Area (PHT): 4.04 cm    SHUNTS  MV Decel Time: 188 msec    Systemic VTI:  0.22 m  MV E velocity: 98.90 cm/s  Systemic Diam: 1.80 cm  MV A velocity: 86.50 cm/s  MV E/A ratio:  1.14   Julien Nordmann MD  Electronically signed by Julien Nordmann MD  Signature Date/Time: 11/15/2021/1:31:10 PM        Recent Radiology Findings:    CLINICAL DATA:  Cough since January. Chronic cough.   EXAM: CHEST - 2 VIEW   COMPARISON:  Chest radiograph 12/10/2020. Included portions of cardiac CT 04/08/2020    FINDINGS: The heart is normal in size. Mediastinal contours are normal. Aortic atherosclerosis. Minimal peribronchial thickening without confluent airspace disease. Minimal lingular scarring. No pulmonary edema, pleural effusion or pneumothorax. Bilateral peripherally calcified breast implants. Mild thoracic spondylosis   IMPRESSION: Minimal peribronchial thickening, can be seen with bronchitis or asthma.   Aortic Atherosclerosis (ICD10-I70.0).     Electronically Signed   By: Narda Rutherford M.D.   On: 08/15/2022 11:20   I have independently reviewed the above radiologic studies and discussed with the patient   Recent Lab Findings:  Lab Results  Component Value Date   WBC 7.0 12/06/2022   HGB 15.4 12/06/2022   HCT 46.0 12/06/2022   PLT 203 12/06/2022   GLUCOSE 109 (H) 12/06/2022   CHOL 152 11/14/2022   TRIG 174 (H) 11/14/2022   HDL 60 11/14/2022   LDLDIRECT 116.0 02/12/2020   LDLCALC 57 11/14/2022   ALT 21 11/14/2022   AST 26 11/14/2022   NA 138 12/06/2022   K 4.5 12/06/2022   CL 98 12/06/2022   CREATININE 0.91 12/06/2022   BUN 12 12/06/2022   CO2 22 12/06/2022   TSH 1.26 09/06/2021      Assessment / Plan:    -Multivessel coronary artery disease:  Very pleasant 89 year old female with the above described past medical history, lifelong non-smoker.  She presents with stable angina pectoris on aspirin and Imdur. She is noted on left heart catheterization today to have significant three-vessel coronary artery disease.  She has preserved biventricular function based on an echocardiogram 1 year ago. We agree that coronary bypass grafting would be her best option for revascularization.  The procedure and expected postoperative course were discussed with her in detail this afternoon and her questions were answered.  She would like for [place] to proceed with preoperative testing to include repeat echocardiogram, carotid Dopplers, and lower extremity in preparation for possible  surgery later this week. Dr. Maicee Fetch will review her coronary angiography and clinical data. Discussion regarding the timing of surgery will follow.  -History of hypertension:  Controlled with amlodipine and losartan  -Hypothyroidism: On replacement therapy  -History of gastroesophageal reflux disease: On Aciphex   I  spent 30 minutes counseling the patient face to face.   Leary Roca, PA-C  12/11/2022 3:09 PM  Patient seen and examined.  Records reviewed.  I personally reviewed her catheterization images.  Severe proximal circumflex disease with moderate, but hemodynamically significant, LAD and RCA disease.  The proximal circumflex lesion is not amenable to percutaneous intervention.  I discussed the options of CABG versus medical therapy with Mrs. Elisabeth Most.    I informed her of the general nature of CABG.  She understands the need for general anesthesia, the incisions to be used, use of cardiopulmonary bypass, the use of drainage tubes and temporary pacemaker wires postoperatively, the expected hospital stay, and the overall recovery.  I informed her of the indications, risk, benefits, and alternatives.  She understands the risks include, but not limited to death, stroke, MI, DVT, PE, bleeding, possible need for transfusion, infection, cardiac arrhythmias, respiratory renal failure, as well as possibility of other unforeseeable complications.  She accepts the risks and agrees to proceed.  Plan CABG Wednesday, 12/12/2022.  Salvatore Decent Shadara Fetch, MD [facility] [phone]

## 2022-12-11 NOTE — Consult Note (Cosign Needed)
[address]       [place] [address]             [phone]        Cassidi Modesitt [facility] Medical Record #[id number] Date of Birth: [birth year]  Referring: No ref. provider found Primary Care: Eden Emms, NP Primary Cardiologist:Christopher End, MD  Reason for consult:  Evaluation for potential surgical management of multivessel coronary artery disease   History of Present Illness:     Mrs. Brewer HD 34 days of 89 year old female with past history of  known coronary artery disease with a history of angina, carotid artery stenosis, dyslipidemia, hypertension, and acquired hypothyroidism.  She had an abnormal coronary CTA in 2021 and has been followed by her cardiologist regularly since then.  She was last seen by Eula Listen, PA-C about 3 weeks ago after having an episode of chest pressure while at church a few days before.  EKG and high-sensitivity troponin were normal at that visit.  Follow-up coronary CTA was ordered showing elevated calcium score of 2080 and evidence of significant coronary stenoses in the proximal to mid LAD and proximal RCA.  Given these findings, left heart catheterization was recommended and was done electively earlier today confirming advanced three-vessel coronary artery disease.  An echocardiogram obtained about 1 year ago showed normal biventricular function.  There was evidence of aortic sclerosis without aortic valve stenosis.  There was mild mitral insufficiency.  We have been asked to evaluate Ms. Olkowski for possible coronary bypass grafting for multivessel coronary artery disease presenting stable angina pectoris.  Ms. Lamarca recently relocated from [place] to [facility]. She remains quite active and currently assists in caring for a special needs grandson and also for her daughter who recently sustained orthopedic injuries.  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  She is currently comfortable, free of any chest pain or shortness of breath.   Current Activity/ Functional Status: Patient is independent with mobility/ambulation, transfers, ADL's, IADL's.   Zubrod Score: At the time of surgery this patient's most appropriate activity status/level should be described as: []     0    Normal activity, no symptoms [x]     1    Restricted in physical strenuous activity but ambulatory, able to do out light work []     2    Ambulatory and capable of self care, unable to do work activities, up and about                 more than 50%  Of the time                            []     3    Only limited self care, in bed greater than 50% of waking hours []     4    Completely disabled, no self care, confined to bed or chair []     5    Moribund  Past Medical History:  Diagnosis Date   Asthma    Carotid arterial disease (HCC)    a. 06/2019 Carotid U/S: RICA 50-69%. Mod LICA plaque.   Chicken pox    Diastolic dysfunction    a. 02/2020 Echo: EF 60-65%, no rwma, GrI DD. Nl RV size/fxn. Triv MR. Mild-mod Ao sclerosis w/o stenois.   Frequent headaches    GERD (gastroesophageal reflux disease)  Heart murmur    a. 02/2020 Echo: Triv MR. Mild-mod Ao sclerosis w/o stenosis.   Hyperlipidemia    Hypertension    Hypothyroidism    Nonobstructive CAD (coronary artery disease)    a.  Remote Cath in [facility] - reportedly nonobs dzs; b. 03/2020 Cor CTA:  Ca2+ = 1844 (94th %'ile). LAD 50p, RCA 25-49 (FFRct nl in prox RCA w/ slow taper in mid-dist segments from 0.8-->0.6).   Rheumatic fever    Urine incontinence     Past Surgical History:  Procedure Laterality Date   ABDOMINAL HYSTERECTOMY     APPENDECTOMY     BREAST BIOPSY     MASTECTOMY Bilateral 04/1979    Social History   Tobacco Use  Smoking Status Never  Smokeless Tobacco Never    Social History   Substance and Sexual Activity  Alcohol Use Yes   Comment: maybe once a year     Allergies  Allergen  Reactions   Atorvastatin     Myalgias    Pollen Extract     Sneezing, coughing     Current Facility-Administered Medications  Medication Dose Route Frequency Provider Last Rate Last Admin   0.9% sodium chloride infusion  1 mL/kg/hr Intravenous Continuous End, Christopher, MD 78.5 mL/hr at 12/11/22 0930 1 mL/kg/hr at 12/11/22 0930   aspirin chewable tablet 81 mg  81 mg Oral Pre-Cath End, Christopher, MD       fentaNYL (SUBLIMAZE) injection    PRN End, Cristal Deer, MD   25 mcg at 12/11/22 1330   heparin sodium (porcine) injection    PRN End, Cristal Deer, MD   4,000 Units at 12/11/22 1339   iohexol (OMNIPAQUE) 350 MG/ML injection    PRN End, Cristal Deer, MD   50 mL at 12/11/22 1353   lidocaine (PF) (XYLOCAINE) 1 % injection    PRN End, Cristal Deer, MD   2 mL at 12/11/22 1336   midazolam (VERSED) injection    PRN End, Cristal Deer, MD   1 mg at 12/11/22 1330   Radial Cocktail/Verapamil only    PRN End, Cristal Deer, MD   10 mL at 12/11/22 1337   Radial Cocktail/Verapamil only    PRN End, Cristal Deer, MD   10 mL at 12/11/22 1353    Medications Prior to Admission  Medication Sig Dispense Refill Last Dose   albuterol (VENTOLIN HFA) 108 (90 Base) MCG/ACT inhaler Inhale 2 puffs into the lungs every 6 (six) hours as needed for wheezing or shortness of breath. 8 g 2 12/11/2022   amLODipine (NORVASC) 5 MG tablet TAKE 1 TABLET DAILY 90 tablet 0 12/11/2022   aspirin EC 81 MG tablet Take 1 tablet (81 mg total) by mouth daily. Swallow whole. 90 tablet 3 12/11/2022   escitalopram (LEXAPRO) 10 MG tablet Take 1 tablet (10 mg total) by mouth daily. (Patient taking differently: Take 10 mg by mouth 2 (two) times a week.) 90 tablet 0 Past Week   fluticasone (FLONASE) 50 MCG/ACT nasal spray Place 1 spray into both nostrils daily as needed for allergies or rhinitis.   12/10/2022   fluticasone-salmeterol (WIXELA INHUB) 250-50 MCG/ACT AEPB Inhale 1 puff into the lungs in the morning and at bedtime.   Past Week    hydrochlorothiazide (HYDRODIURIL) 25 MG tablet Take 1 tablet (25 mg total) by mouth daily. PLEASE CALL OFFICE TO SCHEDULE APPOINTMENT PRIOR TO NEXT REFILL 90 tablet 0 12/10/2022   isosorbide mononitrate (IMDUR) 30 MG 24 hr tablet Take 1 tablet (30 mg total) by mouth daily. 90 tablet 3  12/11/2022   levothyroxine (SYNTHROID) 75 MCG tablet Take 1 tablet (75 mcg total) by mouth daily. 30 tablet 0 12/11/2022   losartan (COZAAR) 50 MG tablet Take 1 tablet (50 mg total) by mouth daily. 90 tablet 3 12/11/2022   RABEprazole (ACIPHEX) 20 MG tablet TAKE 1 TABLET DAILY 90 tablet 3 12/11/2022   rosuvastatin (CRESTOR) 20 MG tablet TAKE 1 TABLET DAILY 90 tablet 0 12/11/2022   tiZANidine (ZANAFLEX) 2 MG tablet Take 1 tablet (2 mg total) by mouth at bedtime as needed for muscle spasms. 30 tablet 1 Past Week   loratadine (CLARITIN) 10 MG tablet Take 10 mg by mouth daily as needed for allergies.   More than a month   nitroGLYCERIN (NITROSTAT) 0.4 MG SL tablet Place 1 tablet (0.4 mg total) under the tongue every 5 (five) minutes as needed. 25 tablet 3 More than a month    Family History  Problem Relation Age of Onset   Early death Mother 73   Kidney disease Mother        bright disease   Other Father        tumor on the side of his leg   Heart disease Brother    Heart attack Brother 39   Heart disease Brother 54   Leukemia Daughter    Valvular heart disease Daughter        s/p mitral valve replacement     Review of Systems:     Cardiac Review of Systems: Y or  [    ]= no  Chest Pain [  x  ]  Resting SOB [   ] Exertional SOB  [  ]  Orthopnea [  ]   Pedal Edema [   ]    Palpitations [  ] Syncope  [  ]   Presyncope [   ]  General Review of Systems: [Y] = yes [  ]=no Constitional: recent weight change [  ]; anorexia [  ]; fatigue [  ]; nausea [  ]; night sweats [  ]; fever [  ]; or chills [  ]                                                               Dental: Last Dentist visit:   Eye : blurred vision [  ];  diplopia [   ]; vision changes [  ];  Amaurosis fugax[  ]; Resp: cough [  ];  wheezing[  ];  hemoptysis[  ]; shortness of breath[  ]; paroxysmal nocturnal dyspnea[  ]; dyspnea on exertion[  ]; or orthopnea[  ];  GI:  gallstones[  ], vomiting[  ];  dysphagia[  ]; melena[  ];  hematochezia [  ]; heartburn[  ];   Hx of  Colonoscopy[  ]; GU: kidney stones [  ]; hematuria[  ];   dysuria [  ];  nocturia[  ];  history of     obstruction [  ]; urinary frequency [  ]             Skin: rash, swelling[  ];, hair loss[  ];  peripheral edema[  ];  or itching[  ]; Musculosketetal: myalgias[  ];  joint swelling[  ];  joint erythema[  ];  joint pain[  ];  back pain[  ];  Heme/Lymph: bruising[  ];  bleeding[  ];  anemia[  ];  Neuro: TIA[  ];  headaches[  ];  stroke[  ];  vertigo[  ];  seizures[  ];   paresthesias[  ];  difficulty walking[  ];  Psych:depression[  ]; anxiety[  ];  Endocrine: diabetes[  ];  thyroid dysfunction[  ];    Physical Exam: BP (!) 150/69   Pulse (!) 58   Temp (!) 97.4 F (36.3 C) (Temporal)   Resp 14   Ht 5\' 5"  (1.651 m)   Wt 78.5 kg   SpO2 93%   BMI 28.79 kg/m    General appearance: alert, cooperative, and no distress Head: Normocephalic, without obvious abnormality, atraumatic Neck: no adenopathy, no carotid bruit, no JVD, and supple, symmetrical, trachea midline Lymph nodes: No cervical or clavicular adenopathy Resp: clear to auscultation bilaterally Cardio: Regular rhythm without murmur GI: Soft, no tenderness. Extremities: No deformities.  All warm and well-perfused.  She has palpable pedal pulses bilaterally.  There is a TR band over the right radial artery Neurologic: Grossly normal  Diagnostic Studies & Laboratory data:  LEFT HEART CATH AND CORONARY ANGIOGRAPHY   Conclusion  Conclusions: Significant multivessel coronary artery disease, including sequential 60% ostial and 50% mid LAD stenoses, complex ostial LCx stenosis involving trifurcation of OM1, OM2, and  LCx proper with hazy 90% stenosis, and diffuse calcified RCA disease of up to 70%. Normal left ventricular systolic function (LVEF 55-65%) and filling pressure (LVEDP 10 mmHg).   Recommendations: Given severe multivessel CAD and continued vague chest pain at rest and with activity, we will admit [name] for medical optimization and cardiac surgery consultation for CABG. Continue aggressive secondary prevention of coronary artery disease. Obtain echocardiogram.   Yvonne Kendall, MD [facility]  Coronary Findings  Diagnostic Dominance: Right Left Main  Vessel is large.    Left Anterior Descending  Ost LAD lesion is 60% stenosed. The lesion is calcified.  Mid LAD-1 lesion is 20% stenosed.  Mid LAD-2 lesion is 50% stenosed.    First Diagonal Branch  Vessel is moderate in size.  1st Diag lesion is 50% stenosed.    Second Diagonal Branch  Vessel is moderate in size.    Left Circumflex  Vessel is large.  Ost Cx to Prox Cx lesion is 90% stenosed.  Prox Cx lesion is 20% stenosed.    First Obtuse Marginal Branch  Vessel is moderate in size.    Second Obtuse Marginal Branch  Vessel is moderate in size.  2nd Mrg lesion is 80% stenosed.    Third Obtuse Marginal Branch  Vessel is small in size.    Fourth Obtuse Marginal Branch  Vessel is small in size.    Right Coronary Artery  Vessel is moderate in size. There is moderate diffuse disease throughout the vessel.  Prox RCA to Mid RCA lesion is 70% stenosed. The lesion is eccentric. The lesion is moderately calcified.  Mid RCA lesion is 70% stenosed.    Right Posterior Descending Artery  Vessel is moderate in size.    Right Posterior Atrioventricular Artery  Vessel is moderate in size.    First Right Posterolateral Branch  Vessel is moderate in size.    Intervention   No interventions have been documented.   Left Heart  Left Ventricle The left ventricular size is normal. The left ventricular systolic  function is normal. LV end diastolic pressure is normal. LVEDP 10 mmHg. The left ventricular ejection  fraction is 55-65% by visual estimate.  Aortic Valve There is no aortic valve stenosis.   Coronary Diagrams  Diagnostic Dominance: Right   CLINICAL DATA:  Chest pain and shortness of breath   EXAM: Cardiac/Coronary  CTA   TECHNIQUE: The patient was scanned on a Siemens Somatom go.Top scanner.   : A retrospective scan was triggered in the ascending thoracic aorta. Axial non-contrast 3 mm slices were carried out through the heart. The data set was analyzed on a dedicated work station and scored using the Agatson method. Gantry rotation speed was 330 msecs and collimation was .6 mm. 50mg  of metoprolol and 0.8 mg of sl NTG was given. The 3D data set was reconstructed in 5% intervals of the 60-95 % of the R-R cycle. Diastolic phases were analyzed on a dedicated work station using MPR, MIP and VRT modes. The patient received 75 cc of contrast.   FINDINGS: Aorta:  Normal size.  Aortic wall calcifications.  No dissection.   Aortic Valve:  Trileaflet.  No calcifications.   Coronary Arteries:  Normal coronary origin.  Right dominance.   RCA is a dominant artery. There is calcified plaque proximally causing moderate stenosis (50-69%).   Left main gives rise to LAD and LCX arteries. LM has calcified plaque proximally causing mild stenosis (25-49%).   LAD has heavily calcified plaque proximally causing severe stenosis (>70%).   LCX is a non-dominant artery. There is calcified plaque proximally causing mild stenosis (25-49%).   Other findings:   Normal pulmonary vein drainage into the left atrium.   Normal left atrial appendage without a thrombus.   Normal size of the pulmonary artery.   IMPRESSION: 1. Coronary calcium score of 2280.   2. Normal coronary origin with right dominance.   3. Severe proximal-mid LAD stenosis (>70%).   4. Moderate proximal RCA stenosis  (50-69%).   5. Mild LM and LCx stenosis (25-49%).   6. CAD-RADS 4 Severe stenosis. (70-99% or > 50% left main). Cardiac catheterization is recommended. Consider symptom-guided anti-ischemic pharmacotherapy as well as risk factor modification per guideline directed care.   7. Additional analysis with CT FFR will be submitted and reported separately.     Electronically Signed   By: Debbe Odea M.D.   On: 11/30/2022 14:44    ECHOCARDIOGRAM REPORT     Patient Name:   MYLEAH CAVENDISH Date of Exam: 11/15/2021  Medical Rec #:  [id number]         Height:       65.0 in  Accession #:    [id number]        Weight:       190.6 lb  Date of Birth:  23-Dec-1933         BSA:          1.938 m  Patient Age:    88 years          BP:           130/72 mmHg  Patient Gender: F                 HR:           64 bpm.  Exam Location:  [place]   Procedure: 2D Echo, Cardiac Doppler and Color Doppler   Indications:    R06.9 DOE    History:        Patient has prior history of Echocardiogram examinations,  most  recent 03/09/2020. TIA and Carotid Disease; Risk                  Factors:Hypertension, Dyslipidemia and Non-Smoker.    Sonographer:    Dondra Prader RVT  Referring Phys: [id number] Raymon Mutton DUNN     Sonographer Comments: Suboptimal parasternal window.  IMPRESSIONS     1. Left ventricular ejection fraction, by estimation, is 60 to 65%. The  left ventricle has normal function. The left ventricle has no regional  wall motion abnormalities. Left ventricular diastolic parameters are  consistent with Grade II diastolic  dysfunction (pseudonormalization).   2. Right ventricular systolic function is normal. The right ventricular  size is normal.   3. The mitral valve is normal in structure. Mild mitral valve  regurgitation. No evidence of mitral stenosis.   4. The aortic valve is normal in structure. Aortic valve regurgitation is  not visualized. No aortic stenosis is present.    5. The inferior vena cava is normal in size with greater than 50%  respiratory variability, suggesting right atrial pressure of 3 mmHg.   Comparison(s): 03/09/20 EF 60-65%, no rwma, GrI DD. Nl RV size/fxn. Triv  MR. Mild-mod Ao sclerosis w/o stenois.   FINDINGS   Left Ventricle: Left ventricular ejection fraction, by estimation, is 60  to 65%. The left ventricle has normal function. The left ventricle has no  regional wall motion abnormalities. The left ventricular internal cavity  size was normal in size. There is   no left ventricular hypertrophy. Left ventricular diastolic parameters  are consistent with Grade II diastolic dysfunction (pseudonormalization).   Right Ventricle: The right ventricular size is normal. No increase in  right ventricular wall thickness. Right ventricular systolic function is  normal.   Left Atrium: Left atrial size was normal in size.   Right Atrium: Right atrial size was normal in size.   Pericardium: There is no evidence of pericardial effusion.   Mitral Valve: The mitral valve is normal in structure. Mild mitral valve  regurgitation. No evidence of mitral valve stenosis.   Tricuspid Valve: The tricuspid valve is normal in structure. Tricuspid  valve regurgitation is not demonstrated. No evidence of tricuspid  stenosis.   Aortic Valve: The aortic valve is normal in structure. Aortic valve  regurgitation is not visualized. No aortic stenosis is present. Aortic  valve mean gradient measures 3.0 mmHg. Aortic valve peak gradient measures  4.9 mmHg. Aortic valve area, by VTI  measures 1.95 cm.   Pulmonic Valve: The pulmonic valve was normal in structure. Pulmonic valve  regurgitation is not visualized. No evidence of pulmonic stenosis.   Aorta: The aortic root is normal in size and structure.   Venous: The inferior vena cava is normal in size with greater than 50%  respiratory variability, suggesting right atrial pressure of 3 mmHg.    IAS/Shunts: No atrial level shunt detected by color flow Doppler.     LEFT VENTRICLE  PLAX 2D  LVIDd:         4.50 cm   Diastology  LVIDs:         2.50 cm   LV e' medial:    8.38 cm/s  LV PW:         1.20 cm   LV E/e' medial:  11.8  LV IVS:        1.20 cm   LV e' lateral:   7.83 cm/s  LVOT diam:     1.80 cm   LV E/e' lateral: 12.6  LV SV:         56  LV SV Index:   29  LVOT Area:     2.54 cm     RIGHT VENTRICLE             IVC  RV Basal diam:  4.30 cm     IVC diam: 1.50 cm  RV Mid diam:    3.50 cm  RV S prime:     20.00 cm/s  TAPSE (M-mode): 2.4 cm   LEFT ATRIUM             Index        RIGHT ATRIUM           Index  LA diam:        3.70 cm 1.91 cm/m   RA Area:     12.40 cm  LA Vol (A2C):   59.1 ml 30.49 ml/m  RA Volume:   33.90 ml  17.49 ml/m  LA Vol (A4C):   53.2 ml 27.45 ml/m  LA Biplane Vol: 58.6 ml 30.24 ml/m   AORTIC VALVE                    PULMONIC VALVE  AV Area (Vmax):    1.87 cm     PV Vmax:       1.15 m/s  AV Area (Vmean):   1.81 cm     PV Peak grad:  5.3 mmHg  AV Area (VTI):     1.95 cm  AV Vmax:           111.00 cm/s  AV Vmean:          76.700 cm/s  AV VTI:            0.288 m  AV Peak Grad:      4.9 mmHg  AV Mean Grad:      3.0 mmHg  LVOT Vmax:         81.70 cm/s  LVOT Vmean:        54.700 cm/s  LVOT VTI:          0.221 m  LVOT/AV VTI ratio: 0.77    AORTA  Ao Root diam: 3.30 cm  Ao Asc diam:  3.30 cm  Ao Arch diam: 2.8 cm   MITRAL VALVE  MV Area (PHT): 4.04 cm    SHUNTS  MV Decel Time: 188 msec    Systemic VTI:  0.22 m  MV E velocity: 98.90 cm/s  Systemic Diam: 1.80 cm  MV A velocity: 86.50 cm/s  MV E/A ratio:  1.14   Julien Nordmann MD  Electronically signed by Julien Nordmann MD  Signature Date/Time: 11/15/2021/1:31:10 PM        Recent Radiology Findings:    CLINICAL DATA:  Cough since January. Chronic cough.   EXAM: CHEST - 2 VIEW   COMPARISON:  Chest radiograph 12/10/2020. Included portions of cardiac CT 04/08/2020    FINDINGS: The heart is normal in size. Mediastinal contours are normal. Aortic atherosclerosis. Minimal peribronchial thickening without confluent airspace disease. Minimal lingular scarring. No pulmonary edema, pleural effusion or pneumothorax. Bilateral peripherally calcified breast implants. Mild thoracic spondylosis   IMPRESSION: Minimal peribronchial thickening, can be seen with bronchitis or asthma.   Aortic Atherosclerosis (ICD10-I70.0).     Electronically Signed   By: Narda Rutherford M.D.   On: 08/15/2022 11:20   I have independently reviewed the above radiologic studies and discussed with the patient   Recent Lab Findings:  Lab Results  Component Value Date   WBC 7.0 12/06/2022   HGB 15.4 12/06/2022   HCT 46.0 12/06/2022   PLT 203 12/06/2022   GLUCOSE 109 (H) 12/06/2022   CHOL 152 11/14/2022   TRIG 174 (H) 11/14/2022   HDL 60 11/14/2022   LDLDIRECT 116.0 02/12/2020   LDLCALC 57 11/14/2022   ALT 21 11/14/2022   AST 26 11/14/2022   NA 138 12/06/2022   K 4.5 12/06/2022   CL 98 12/06/2022   CREATININE 0.91 12/06/2022   BUN 12 12/06/2022   CO2 22 12/06/2022   TSH 1.26 09/06/2021      Assessment / Plan:    -Multivessel coronary artery disease:  Very pleasant 89 year old female with the above described past medical history, lifelong non-smoker.  She presents with stable angina pectoris on aspirin and Imdur. She is noted on left heart catheterization today to have significant three-vessel coronary artery disease.  She has preserved biventricular function based on an echocardiogram 1 year ago. We agree that coronary bypass grafting would be her best option for revascularization.  The procedure and expected postoperative course were discussed with her in detail this afternoon and her questions were answered.  She would like for [place] to proceed with preoperative testing to include repeat echocardiogram, carotid Dopplers, and lower extremity in preparation for possible  surgery later this week. Dr. Belissa Fetch will review her coronary angiography and clinical data. Discussion regarding the timing of surgery will follow.  -History of hypertension:  Controlled with amlodipine and losartan  -Hypothyroidism: On replacement therapy  -History of gastroesophageal reflux disease: On Aciphex   I  spent 30 minutes counseling the patient face to face.   Leary Roca, PA-C  12/11/2022 3:09 PM  Patient seen and examined.  Records reviewed.  I personally reviewed her catheterization images.  Severe proximal circumflex disease with moderate, but hemodynamically significant, LAD and RCA disease.  The proximal circumflex lesion is not amenable to percutaneous intervention.  I discussed the options of CABG versus medical therapy with Mrs. Elisabeth Most.    I informed her of the general nature of CABG.  She understands the need for general anesthesia, the incisions to be used, use of cardiopulmonary bypass, the use of drainage tubes and temporary pacemaker wires postoperatively, the expected hospital stay, and the overall recovery.  I informed her of the indications, risk, benefits, and alternatives.  She understands the risks include, but not limited to death, stroke, MI, DVT, PE, bleeding, possible need for transfusion, infection, cardiac arrhythmias, respiratory renal failure, as well as possibility of other unforeseeable complications.  She accepts the risks and agrees to proceed.  Plan CABG Wednesday, 12/12/2022.  Salvatore Decent Persephone Fetch, MD [facility] [phone]

## 2022-12-12 ENCOUNTER — Inpatient Hospital Stay (HOSPITAL_COMMUNITY): Payer: Medicare Other

## 2022-12-12 ENCOUNTER — Encounter (HOSPITAL_COMMUNITY): Payer: Self-pay | Admitting: Internal Medicine

## 2022-12-12 DIAGNOSIS — I2510 Atherosclerotic heart disease of native coronary artery without angina pectoris: Secondary | ICD-10-CM | POA: Diagnosis not present

## 2022-12-12 DIAGNOSIS — I25110 Atherosclerotic heart disease of native coronary artery with unstable angina pectoris: Secondary | ICD-10-CM | POA: Diagnosis not present

## 2022-12-12 DIAGNOSIS — I25119 Atherosclerotic heart disease of native coronary artery with unspecified angina pectoris: Secondary | ICD-10-CM | POA: Diagnosis not present

## 2022-12-12 LAB — SURGICAL PCR SCREEN
MRSA, PCR: NEGATIVE
Staphylococcus aureus: NEGATIVE

## 2022-12-12 LAB — URINALYSIS, ROUTINE W REFLEX MICROSCOPIC
Bacteria, UA: NONE SEEN
Bilirubin Urine: NEGATIVE
Glucose, UA: NEGATIVE mg/dL
Ketones, ur: NEGATIVE mg/dL
Leukocytes,Ua: NEGATIVE
Nitrite: NEGATIVE
Protein, ur: NEGATIVE mg/dL
Specific Gravity, Urine: 1.004 — ABNORMAL LOW (ref 1.005–1.030)
pH: 5 (ref 5.0–8.0)

## 2022-12-12 LAB — BLOOD GAS, ARTERIAL
Acid-Base Excess: 0.7 mmol/L (ref 0.0–2.0)
Bicarbonate: 25.4 mmol/L (ref 20.0–28.0)
O2 Saturation: 95.8 %
Patient temperature: 37
pCO2 arterial: 40 mm[Hg] (ref 32–48)
pH, Arterial: 7.41 (ref 7.35–7.45)
pO2, Arterial: 70 mm[Hg] — ABNORMAL LOW (ref 83–108)

## 2022-12-12 LAB — VAS US DOPPLER PRE CABG
Left ABI: 1.03
Right ABI: 1.06

## 2022-12-12 LAB — ABO/RH: ABO/RH(D): AB POS

## 2022-12-12 LAB — TYPE AND SCREEN
ABO/RH(D): AB POS
Antibody Screen: NEGATIVE

## 2022-12-12 LAB — ECHOCARDIOGRAM COMPLETE
Area-P 1/2: 2.52 cm2
S' Lateral: 3.4 cm

## 2022-12-12 MED ORDER — NOREPINEPHRINE 4 MG/250ML-% IV SOLN
0.0000 ug/min | INTRAVENOUS | Status: DC
  Filled 2022-12-12: qty 250

## 2022-12-12 MED ORDER — CEFAZOLIN SODIUM-DEXTROSE 2-4 GM/100ML-% IV SOLN
2.0000 g | INTRAVENOUS | Status: DC
  Filled 2022-12-12: qty 100

## 2022-12-12 MED ORDER — NITROGLYCERIN IN D5W 200-5 MCG/ML-% IV SOLN
2.0000 ug/min | INTRAVENOUS | Status: DC
  Filled 2022-12-12: qty 250

## 2022-12-12 MED ORDER — MAGNESIUM SULFATE 50 % IJ SOLN
40.0000 meq | INTRAMUSCULAR | Status: DC
  Filled 2022-12-12: qty 9.85

## 2022-12-12 MED ORDER — METOPROLOL TARTRATE 12.5 MG HALF TABLET
12.5000 mg | ORAL_TABLET | Freq: Once | ORAL | Status: AC
  Administered 2022-12-13: 12.5 mg via ORAL
  Filled 2022-12-12: qty 1

## 2022-12-12 MED ORDER — DIAZEPAM 2 MG PO TABS
2.0000 mg | ORAL_TABLET | Freq: Once | ORAL | Status: AC
  Administered 2022-12-13: 2 mg via ORAL
  Filled 2022-12-12: qty 1

## 2022-12-12 MED ORDER — TRANEXAMIC ACID (OHS) PUMP PRIME SOLUTION
2.0000 mg/kg | INTRAVENOUS | Status: DC
  Filled 2022-12-12: qty 1.57

## 2022-12-12 MED ORDER — CHLORHEXIDINE GLUCONATE CLOTH 2 % EX PADS
6.0000 | MEDICATED_PAD | Freq: Once | CUTANEOUS | Status: AC
  Administered 2022-12-12: 6 via TOPICAL

## 2022-12-12 MED ORDER — EPINEPHRINE HCL 5 MG/250ML IV SOLN IN NS
0.0000 ug/min | INTRAVENOUS | Status: DC
  Filled 2022-12-12: qty 250

## 2022-12-12 MED ORDER — PLASMA-LYTE A IV SOLN
INTRAVENOUS | Status: DC
  Filled 2022-12-12: qty 2.5

## 2022-12-12 MED ORDER — MILRINONE LACTATE IN DEXTROSE 20-5 MG/100ML-% IV SOLN
0.3000 ug/kg/min | INTRAVENOUS | Status: DC
  Filled 2022-12-12: qty 100

## 2022-12-12 MED ORDER — POTASSIUM CHLORIDE 2 MEQ/ML IV SOLN
80.0000 meq | INTRAVENOUS | Status: DC
  Filled 2022-12-12: qty 40

## 2022-12-12 MED ORDER — DEXMEDETOMIDINE HCL IN NACL 400 MCG/100ML IV SOLN
0.1000 ug/kg/h | INTRAVENOUS | Status: AC
  Administered 2022-12-13: .5 ug/kg/h via INTRAVENOUS
  Filled 2022-12-12: qty 100

## 2022-12-12 MED ORDER — PHENYLEPHRINE HCL-NACL 20-0.9 MG/250ML-% IV SOLN
30.0000 ug/min | INTRAVENOUS | Status: AC
  Administered 2022-12-13: 15 ug/min via INTRAVENOUS
  Filled 2022-12-12: qty 250

## 2022-12-12 MED ORDER — INSULIN REGULAR(HUMAN) IN NACL 100-0.9 UT/100ML-% IV SOLN
INTRAVENOUS | Status: AC
  Administered 2022-12-13: 1.5 [IU]/h via INTRAVENOUS
  Filled 2022-12-12: qty 100

## 2022-12-12 MED ORDER — HEPARIN 30,000 UNITS/1000 ML (OHS) CELLSAVER SOLUTION
Status: DC
  Filled 2022-12-12: qty 1000

## 2022-12-12 MED ORDER — TRANEXAMIC ACID (OHS) BOLUS VIA INFUSION
15.0000 mg/kg | INTRAVENOUS | Status: AC
  Administered 2022-12-13: 1177.5 mg via INTRAVENOUS
  Filled 2022-12-12: qty 1178

## 2022-12-12 MED ORDER — BISACODYL 5 MG PO TBEC
5.0000 mg | DELAYED_RELEASE_TABLET | Freq: Once | ORAL | Status: AC
  Administered 2022-12-12: 5 mg via ORAL
  Filled 2022-12-12: qty 1

## 2022-12-12 MED ORDER — CEFAZOLIN SODIUM-DEXTROSE 2-4 GM/100ML-% IV SOLN
2.0000 g | INTRAVENOUS | Status: AC
  Administered 2022-12-13: 2 g via INTRAVENOUS
  Filled 2022-12-12: qty 100

## 2022-12-12 MED ORDER — CHLORHEXIDINE GLUCONATE 0.12 % MT SOLN
15.0000 mL | Freq: Once | OROMUCOSAL | Status: AC
  Administered 2022-12-13: 15 mL via OROMUCOSAL
  Filled 2022-12-12: qty 15

## 2022-12-12 MED ORDER — CHLORHEXIDINE GLUCONATE CLOTH 2 % EX PADS: 6.0000 | MEDICATED_PAD | Freq: Once | CUTANEOUS | Status: DC

## 2022-12-12 MED ORDER — TRANEXAMIC ACID 1000 MG/10ML IV SOLN
1.5000 mg/kg/h | INTRAVENOUS | Status: DC
  Filled 2022-12-12: qty 25

## 2022-12-12 MED ORDER — VANCOMYCIN HCL 1250 MG/250ML IV SOLN
1250.0000 mg | INTRAVENOUS | Status: AC
  Administered 2022-12-13: 1250 mg via INTRAVENOUS
  Filled 2022-12-12: qty 250

## 2022-12-12 NOTE — Progress Notes (Signed)
1 Day Post-Op Procedure(s) (LRB): LEFT HEART CATH AND CORONARY ANGIOGRAPHY (N/A) Subjective: No complaints this morning.  Objective: Vital signs in last 24 hours: Temp:  [97.6 F (36.4 C)-98.3 F (36.8 C)] 98.3 F (36.8 C) (10/01 0808) Pulse Rate:  [0-98] 53 (10/01 0829) Cardiac Rhythm: Sinus bradycardia (10/01 1027) Resp:  [8-22] 18 (10/01 0411) BP: (124-169)/(60-83) 166/70 (10/01 0829) SpO2:  [92 %-99 %] 94 % (10/01 0808)  Hemodynamic parameters for last 24 hours:    Intake/Output from previous day: No intake/output data recorded. Intake/Output this shift: No intake/output data recorded.  General appearance: alert, cooperative, and no distress Neurologic: intact Heart: regular rate and rhythm Lungs: clear to auscultation bilaterally  Lab Results: No results for input(s): "WBC", "HGB", "HCT", "PLT" in the last 72 hours. BMET: No results for input(s): "NA", "K", "CL", "CO2", "GLUCOSE", "BUN", "CREATININE", "CALCIUM" in the last 72 hours.  PT/INR: No results for input(s): "LABPROT", "INR" in the last 72 hours. ABG No results found for: "PHART", "HCO3", "TCO2", "ACIDBASEDEF", "O2SAT" CBG (last 3)  No results for input(s): "GLUCAP" in the last 72 hours.  Assessment/Plan: S/P Procedure(s) (LRB): LEFT HEART CATH AND CORONARY ANGIOGRAPHY (N/A) Three-vessel coronary disease with angina  Please see full consultation note dated 12/11/2022  For CABG tomorrow morning.  All questions answered.   LOS: 1 day    Loreli Slot 12/12/2022

## 2022-12-12 NOTE — Progress Notes (Signed)
Mobility Specialist Progress Note:   12/12/22 1146  Therapy Vitals  Temp 98 F (36.7 C)  Temp Source Oral  Pulse Rate 77  Resp 17  BP 131/67  Patient Position (if appropriate) Lying  Oxygen Therapy  SpO2 96 %  O2 Device Room Air  Mobility  Activity Ambulated independently in hallway  Level of Assistance Independent  Assistive Device None  Distance Ambulated (ft) 940 ft  Activity Response Tolerated well  Mobility Referral Yes  $Mobility charge 1 Mobility  Mobility Specialist Start Time (ACUTE ONLY) 1046  Mobility Specialist Stop Time (ACUTE ONLY) 1055  Mobility Specialist Time Calculation (min) (ACUTE ONLY) 9 min    During Mobility: 75 HR Post Mobility:  69 HR  Received pt in bed having no complaints and agreeable to mobility. Pt was asymptomatic throughout ambulation and returned to room w/o fault. Left ambulating in room w/ call bell in reach and all needs met.   D'Vante Earlene Plater Mobility Specialist Please contact via Special educational needs teacher or Rehab office at [phone]

## 2022-12-12 NOTE — Anesthesia Preprocedure Evaluation (Signed)
Anesthesia Evaluation  Patient identified by MRN, date of birth, ID band Patient awake    Reviewed: Allergy & Precautions, H&P , NPO status , Patient's Chart, lab work & pertinent test results  Airway Mallampati: II  TM Distance: >3 FB Neck ROM: Full    Dental no notable dental hx.    Pulmonary asthma    Pulmonary exam normal breath sounds clear to auscultation       Cardiovascular hypertension, + angina  + CAD  Normal cardiovascular exam Rhythm:Regular Rate:Normal  IMPRESSIONS     1. Left ventricular ejection fraction, by estimation, is 60 to 65%. The  left ventricle has normal function. The left ventricle has no regional  wall motion abnormalities. Left ventricular diastolic parameters are  consistent with Grade I diastolic  dysfunction (impaired relaxation).   2. Right ventricular systolic function is normal. The right ventricular  size is normal. Tricuspid regurgitation signal is inadequate for assessing  PA pressure.   3. The mitral valve is normal in structure. No evidence of mitral valve  regurgitation. No evidence of mitral stenosis.   4. The aortic valve is tricuspid. Aortic valve regurgitation is not  visualized. No aortic stenosis is present.   5. The inferior vena cava is normal in size with greater than 50%  respiratory variability, suggesting right atrial pressure of 3 mmHg.     Neuro/Psych  Headaches PSYCHIATRIC DISORDERS Anxiety Depression       GI/Hepatic Neg liver ROS,GERD  ,,  Endo/Other  Hypothyroidism    Renal/GU negative Renal ROS  negative genitourinary   Musculoskeletal  (+) Arthritis ,    Abdominal   Peds negative pediatric ROS (+)  Hematology negative hematology ROS (+)   Anesthesia Other Findings   Reproductive/Obstetrics negative OB ROS                             Anesthesia Physical Anesthesia Plan  ASA: 4  Anesthesia Plan: General   Post-op Pain  Management:    Induction: Intravenous  PONV Risk Score and Plan:   Airway Management Planned: Oral ETT  Additional Equipment: Arterial line, CVP, PA Cath, 3D TEE and Ultrasound Guidance Line Placement  Intra-op Plan:   Post-operative Plan: Extubation in OR  Informed Consent: I have reviewed the patients History and Physical, chart, labs and discussed the procedure including the risks, benefits and alternatives for the proposed anesthesia with the patient or authorized representative who has indicated his/her understanding and acceptance.     Dental advisory given  Plan Discussed with: CRNA  Anesthesia Plan Comments:         Anesthesia Quick Evaluation

## 2022-12-12 NOTE — TOC Initial Note (Signed)
Transition of Care [facility]) - Initial/Assessment Note    Patient Details  Name: Christine [name] MRN: [id number] Date of Birth: 1933/03/26  [facility]) CM/SW Contact:    Gala Lewandowsky, RN Phone Number: 12/12/2022, 12:27 PM  Clinical Narrative:  Patient plan for CABG 12-13-22. PTA patient was independent from home with support of daughter. Patient reports that she has DME: recliner lift chair, cane, wheelchair, and walker in the home. Case Manager will continue to follow for transition of care needs as the patient progresses.                   Expected Discharge Plan: Home w Home Health Services Barriers to Discharge: Continued Medical Work up   Patient Goals and CMS Choice Patient states their goals for this hospitalization and ongoing recovery are:: plan to return to daughters home.   Expected Discharge Plan and Services   Discharge Planning Services: CM Consult Post Acute Care Choice: Home Health Living arrangements for the past 2 months: Single Family Home                   DME Agency: NA  Prior Living Arrangements/Services Living arrangements for the past 2 months: Single Family Home Lives with:: Adult Children Patient language and need for interpreter reviewed:: Yes Do you feel safe going back to the place where you live?: Yes      Need for Family Participation in Patient Care: Yes (Comment) Care giver support system in place?: Yes (comment) Current home services: DME (recliner lift chair, cane, wheelchair, and rolling walker.) Criminal Activity/Legal Involvement Pertinent to Current Situation/Hospitalization: No - Comment as needed  Activities of Daily Living   ADL Screening (condition at time of admission) Does the patient have a NEW difficulty with bathing/dressing/toileting/self-feeding that is expected to last >3 days?: No Does the patient have a NEW difficulty with getting in/out of bed, walking, or climbing stairs that is expected to last  >3 days?: No Does the patient have a NEW difficulty with communication that is expected to last >3 days?: No Is the patient deaf or have difficulty hearing?: No Does the patient have difficulty seeing, even when wearing glasses/contacts?: No Does the patient have difficulty concentrating, remembering, or making decisions?: No  Permission Sought/Granted Permission sought to share information with : Family Supports, Case Manager     Emotional Assessment Appearance:: Appears stated age Attitude/Demeanor/Rapport: Engaged Affect (typically observed): Appropriate Orientation: : Oriented to Self, Oriented to Situation, Oriented to Place, Oriented to  Time Alcohol / Substance Use: Not Applicable Psych Involvement: No (comment)  Admission diagnosis:  Coronary artery disease involving native coronary artery of native heart with unstable angina pectoris (HCC) [I25.110] Patient Active Problem List   Diagnosis Date Noted   Coronary artery disease involving native coronary artery of native heart with unstable angina pectoris (HCC) 12/11/2022   Frequent headaches 10/20/2022   Eustachian tube disorder, right 10/20/2022   Anxiety and depression 08/15/2022   Neck pain 05/12/2022   Sensation of fullness in left ear 05/12/2022   Impacted cerumen of left ear 05/12/2022   Chronic cough 04/06/2022   Other headache syndrome 04/06/2022   Bronchitis 04/06/2022   Thoracic spine pain 03/20/2022   Muscle tightness 03/20/2022   Acute post-traumatic headache, not intractable 01/06/2022   Rib pain on left side 12/26/2021   Cellulitis of left leg 12/13/2021   Hypokalemia 12/13/2021   Closed fracture of body of sternum 11/30/2021   Lightheadedness 11/30/2021   Daytime sleepiness  09/27/2021   Coronary artery disease of native artery of native heart with stable angina pectoris (HCC) 05/12/2020   Chest pain 04/02/2020   Current moderate episode of major depressive disorder without prior episode (HCC) 03/02/2020    Fatigue 02/15/2020   Mild intermittent asthma without complication 02/12/2020   DOE (dyspnea on exertion) 02/12/2020   Stenosis of carotid artery 06/12/2019   Osteoarthritis of knees, bilateral 06/03/2019   Chronic pain of left knee 12/10/2017   Essential hypertension 12/10/2017   Acquired hypothyroidism 12/10/2017   GERD without esophagitis 12/10/2017   Hyperlipidemia LDL goal <70 12/10/2017   PCP:  Eden Emms, NP Pharmacy:   [facility] DELIV[facility] - [place], [place] - [address] [place] [address] Phone: [phone] Fax: [phone]  [id number] [place], [place] - [address] AT [facility] [address] [place] [address] Phone: [phone] Fax: [phone]  Social Determinants of Health (SDOH) Social History: SDOH Screenings   Food Insecurity: No Food Insecurity (12/11/2022)  Housing: Low Risk  (12/11/2022)  Transportation Needs: No Transportation Needs (12/11/2022)  Utilities: Not At Risk (12/11/2022)  Depression (PHQ2-9): Low Risk  (10/20/2022)  Recent Concern: Depression (PHQ2-9) - Medium Risk (08/15/2022)  Tobacco Use: Low Risk  (12/06/2022)   Readmission Risk Interventions     No data to display

## 2022-12-12 NOTE — Progress Notes (Signed)
Rounding Note    Patient Name: Christine [name] Date of Encounter: 12/12/2022  [facility] Cardiologist: Yvonne Kendall, MD   Subjective   Status post left heart cath yesterday by Dr. Okey Dupre.  Patient is asymptomatic awaiting CABG tomorrow morning.  Inpatient Medications    Scheduled Meds:  amLODipine  5 mg Oral Daily   aspirin EC  81 mg Oral Daily   enoxaparin (LOVENOX) injection  40 mg Subcutaneous Q24H   escitalopram  10 mg Oral Once per day on Monday Thursday   fluticasone furoate-vilanterol  1 puff Inhalation Daily   hydrochlorothiazide  25 mg Oral Daily   isosorbide mononitrate  30 mg Oral Daily   levothyroxine  75 mcg Oral Daily   metoprolol tartrate  12.5 mg Oral BID   pantoprazole  40 mg Oral Daily   rosuvastatin  20 mg Oral Daily   sodium chloride flush  3 mL Intravenous Q12H   Continuous Infusions:  sodium chloride     PRN Meds: sodium chloride, acetaminophen, albuterol, nitroGLYCERIN, ondansetron (ZOFRAN) IV, sodium chloride flush, tiZANidine   Vital Signs    Vitals:   12/11/22 2343 12/12/22 0411 12/12/22 0808 12/12/22 0829  BP: (!) 141/64 134/73 (!) 168/83 (!) 166/70  Pulse: (!) 51 (!) 53 (!) 55 (!) 53  Resp: 18 18    Temp: 98.1 F (36.7 C) 98.2 F (36.8 C) 98.3 F (36.8 C)   TempSrc: Oral Oral Oral   SpO2: 97% 97% 94%   Weight:      Height:       No intake or output data in the 24 hours ending 12/12/22 0929    12/11/2022    8:06 AM 12/06/2022   10:38 AM 11/17/2022    3:05 PM  Last 3 Weights  Weight (lbs) 173 lb 173 lb 178 lb  Weight (kg) 78.472 kg 78.472 kg 80.74 kg      Telemetry    Sinus rhythm/sinus bradycardia- Personally Reviewed  ECG    Not performed today- Personally Reviewed  Physical Exam   GEN: No acute distress.   Neck: No JVD Cardiac: RRR, no murmurs, rubs, or gallops.  Respiratory: Clear to auscultation bilaterally. GI: Soft, nontender, non-distended  MS: No edema; No deformity. Neuro:  Nonfocal  Psych:  Normal affect   Labs    High Sensitivity Troponin:   Recent Labs  Lab 11/14/22 1108  TROPONINIHS 7     Chemistry Recent Labs  Lab 12/06/22 1151  NA 138  K 4.5  CL 98  CO2 22  GLUCOSE 109*  BUN 12  CREATININE 0.91  CALCIUM 10.3    Lipids No results for input(s): "CHOL", "TRIG", "HDL", "LABVLDL", "LDLCALC", "CHOLHDL" in the last 168 hours.  Hematology Recent Labs  Lab 12/06/22 1151  WBC 7.0  RBC 5.09  HGB 15.4  HCT 46.0  MCV 90  MCH 30.3  MCHC 33.5  RDW 12.5  PLT 203   Thyroid No results for input(s): "TSH", "FREET4" in the last 168 hours.  BNPNo results for input(s): "BNP", "PROBNP" in the last 168 hours.  DDimer No results for input(s): "DDIMER" in the last 168 hours.   Radiology    CARDIAC CATHETERIZATION  Result Date: 12/11/2022 Conclusions: Significant multivessel coronary artery disease, including sequential 60% ostial and 50% mid LAD stenoses, complex ostial LCx stenosis involving trifurcation of OM1, OM2, and LCx proper with hazy 90% stenosis, and diffuse calcified RCA disease of up to 70%. Normal left ventricular systolic function (LVEF 55-65%) and filling  pressure (LVEDP 10 mmHg). Recommendations: Given severe multivessel CAD and continued vague chest pain at rest and with activity, we will admit Christine [name] for medical optimization and cardiac surgery consultation for CABG. Continue aggressive secondary prevention of coronary artery disease. Obtain echocardiogram. Yvonne Kendall, MD [facility]   Cardiac Studies   Cardiac catheterization (12/11/2022)  Conclusion  Conclusions: Significant multivessel coronary artery disease, including sequential 60% ostial and 50% mid LAD stenoses, complex ostial LCx stenosis involving trifurcation of OM1, OM2, and LCx proper with hazy 90% stenosis, and diffuse calcified RCA disease of up to 70%. Normal left ventricular systolic function (LVEF 55-65%) and filling pressure (LVEDP 10 mmHg).    Recommendations: Given severe multivessel CAD and continued vague chest pain at rest and with activity, we will admit Christine [name] for medical optimization and cardiac surgery consultation for CABG. Continue aggressive secondary prevention of coronary artery disease. Obtain echocardiogram.   Yvonne Kendall, MD [facility]  y Diagrams  Diagnostic Dominance: Right  Intervention   Patient Profile     89 y.o. female patient of Dr. Serita Kyle with history of hypertension, hyperlipidemia and an elevated coronary calcium score greater than 2000.  Because of chest pain she underwent diagnostic coronary angiography yesterday.  Assessment & Plan    1: Coronary artery disease-coronary calcium score greater than 2000.  Left heart cath performed radially by Dr. Okey Dupre yesterday showed three-vessel disease with preserved LV function.  Planned CABG by Dr. Gila Fetch tomorrow morning.  2: Essential hypertension-blood pressure running slightly high today on amlodipine, losartan, hydrochlorothiazide and beta-blocker.  ARB is on hold for bypass surgery  3: Hyperlipidemia-on rosuvastatin 20 with LDL well-controlled at 57.  For questions or updates, please contact [facility] Please consult [email or web address] for contact info under        Signed, Nanetta Batty, MD  12/12/2022, 9:29 AM

## 2022-12-12 NOTE — Progress Notes (Signed)
Pt received OHS book, Move in the Tube sheet, OHS careguide, and incentive spirometer (IS). Pt was educated on approx length of surgery and stay, importance of ambulation and using IS, restrictions, home needs, and CRPII. IS baseline .   Christine Congress MS, ACSM-CEP 12/12/2022 2:29 PM

## 2022-12-13 ENCOUNTER — Inpatient Hospital Stay (HOSPITAL_COMMUNITY)
Admission: AD | Disposition: A | Discharge status: Skilled Nursing Facility | Payer: Self-pay | Source: Home / Self Care | Attending: Thoracic Surgery (Cardiothoracic Vascular Surgery)

## 2022-12-13 ENCOUNTER — Other Ambulatory Visit: Payer: Self-pay

## 2022-12-13 ENCOUNTER — Inpatient Hospital Stay (HOSPITAL_COMMUNITY): Payer: Self-pay | Admitting: Certified Registered Nurse Anesthetist

## 2022-12-13 ENCOUNTER — Inpatient Hospital Stay (HOSPITAL_COMMUNITY): Payer: Medicare Other | Admitting: Certified Registered Nurse Anesthetist

## 2022-12-13 ENCOUNTER — Inpatient Hospital Stay (HOSPITAL_COMMUNITY): Payer: Medicare Other

## 2022-12-13 ENCOUNTER — Encounter (HOSPITAL_COMMUNITY): Payer: Self-pay | Admitting: Internal Medicine

## 2022-12-13 DIAGNOSIS — Z951 Presence of aortocoronary bypass graft: Secondary | ICD-10-CM

## 2022-12-13 DIAGNOSIS — I25119 Atherosclerotic heart disease of native coronary artery with unspecified angina pectoris: Secondary | ICD-10-CM | POA: Diagnosis not present

## 2022-12-13 DIAGNOSIS — I2510 Atherosclerotic heart disease of native coronary artery without angina pectoris: Secondary | ICD-10-CM

## 2022-12-13 HISTORY — PX: TEE WITHOUT CARDIOVERSION: SHX5443

## 2022-12-13 HISTORY — PX: CORONARY ARTERY BYPASS GRAFT: SHX141

## 2022-12-13 LAB — CBC
HCT: 36.1 % (ref 36.0–46.0)
HCT: 30 % — ABNORMAL LOW (ref 36.0–46.0)
HCT: 27.6 % — ABNORMAL LOW (ref 36.0–46.0)
Hemoglobin: 12.3 g/dL (ref 12.0–15.0)
Hemoglobin: 10.1 g/dL — ABNORMAL LOW (ref 12.0–15.0)
Hemoglobin: 9.5 g/dL — ABNORMAL LOW (ref 12.0–15.0)
MCH: 29.6 pg (ref 26.0–34.0)
MCH: 29.6 pg (ref 26.0–34.0)
MCH: 30.2 pg (ref 26.0–34.0)
MCHC: 34.1 g/dL (ref 30.0–36.0)
MCHC: 33.7 g/dL (ref 30.0–36.0)
MCHC: 34.4 g/dL (ref 30.0–36.0)
MCV: 87 fL (ref 80.0–100.0)
MCV: 88 fL (ref 80.0–100.0)
MCV: 87.6 fL (ref 80.0–100.0)
Platelets: 175 K/uL (ref 150–400)
Platelets: 119 10*3/uL — ABNORMAL LOW (ref 150–400)
Platelets: 119 10*3/uL — ABNORMAL LOW (ref 150–400)
RBC: 4.15 MIL/uL (ref 3.87–5.11)
RBC: 3.41 MIL/uL — ABNORMAL LOW (ref 3.87–5.11)
RBC: 3.15 MIL/uL — ABNORMAL LOW (ref 3.87–5.11)
RDW: 12.6 % (ref 11.5–15.5)
RDW: 12.4 % (ref 11.5–15.5)
RDW: 12.6 % (ref 11.5–15.5)
WBC: 6.2 K/uL (ref 4.0–10.5)
WBC: 11.8 10*3/uL — ABNORMAL HIGH (ref 4.0–10.5)
WBC: 8.6 10*3/uL (ref 4.0–10.5)
nRBC: 0 % (ref 0.0–0.2)
nRBC: 0 % (ref 0.0–0.2)
nRBC: 0 % (ref 0.0–0.2)

## 2022-12-13 LAB — ECHO INTRAOPERATIVE TEE
Height: 65 in
Weight: 2768 [oz_av]

## 2022-12-13 LAB — POCT I-STAT 7, (LYTES, BLD GAS, ICA,H+H)
Acid-Base Excess: 1 mmol/L (ref 0.0–2.0)
Acid-Base Excess: 2 mmol/L (ref 0.0–2.0)
Acid-base deficit: 1 mmol/L (ref 0.0–2.0)
Acid-base deficit: 3 mmol/L — ABNORMAL HIGH (ref 0.0–2.0)
Acid-base deficit: 3 mmol/L — ABNORMAL HIGH (ref 0.0–2.0)
Acid-base deficit: 3 mmol/L — ABNORMAL HIGH (ref 0.0–2.0)
Bicarbonate: 25.5 mmol/L (ref 20.0–28.0)
Bicarbonate: 25.4 mmol/L (ref 20.0–28.0)
Bicarbonate: 23.6 mmol/L (ref 20.0–28.0)
Bicarbonate: 22.9 mmol/L (ref 20.0–28.0)
Bicarbonate: 22.7 mmol/L (ref 20.0–28.0)
Bicarbonate: 23.2 mmol/L (ref 20.0–28.0)
Calcium, Ion: 1.05 mmol/L — ABNORMAL LOW (ref 1.15–1.40)
Calcium, Ion: 1.04 mmol/L — ABNORMAL LOW (ref 1.15–1.40)
Calcium, Ion: 1.08 mmol/L — ABNORMAL LOW (ref 1.15–1.40)
Calcium, Ion: 1.15 mmol/L (ref 1.15–1.40)
Calcium, Ion: 1.14 mmol/L — ABNORMAL LOW (ref 1.15–1.40)
Calcium, Ion: 1.14 mmol/L — ABNORMAL LOW (ref 1.15–1.40)
HCT: 26 % — ABNORMAL LOW (ref 36.0–46.0)
HCT: 26 % — ABNORMAL LOW (ref 36.0–46.0)
HCT: 26 % — ABNORMAL LOW (ref 36.0–46.0)
HCT: 27 % — ABNORMAL LOW (ref 36.0–46.0)
HCT: 28 % — ABNORMAL LOW (ref 36.0–46.0)
HCT: 28 % — ABNORMAL LOW (ref 36.0–46.0)
Hemoglobin: 8.8 g/dL — ABNORMAL LOW (ref 12.0–15.0)
Hemoglobin: 8.8 g/dL — ABNORMAL LOW (ref 12.0–15.0)
Hemoglobin: 8.8 g/dL — ABNORMAL LOW (ref 12.0–15.0)
Hemoglobin: 9.2 g/dL — ABNORMAL LOW (ref 12.0–15.0)
Hemoglobin: 9.5 g/dL — ABNORMAL LOW (ref 12.0–15.0)
Hemoglobin: 9.5 g/dL — ABNORMAL LOW (ref 12.0–15.0)
O2 Saturation: 100 %
O2 Saturation: 100 %
O2 Saturation: 99 %
O2 Saturation: 93 %
O2 Saturation: 96 %
O2 Saturation: 94 %
Patient temperature: 35.7
Patient temperature: 36.3
Patient temperature: 36.7
Potassium: 3.7 mmol/L (ref 3.5–5.1)
Potassium: 4.4 mmol/L (ref 3.5–5.1)
Potassium: 4.2 mmol/L (ref 3.5–5.1)
Potassium: 3.9 mmol/L (ref 3.5–5.1)
Potassium: 4.6 mmol/L (ref 3.5–5.1)
Potassium: 4.3 mmol/L (ref 3.5–5.1)
Sodium: 136 mmol/L (ref 135–145)
Sodium: 135 mmol/L (ref 135–145)
Sodium: 136 mmol/L (ref 135–145)
Sodium: 137 mmol/L (ref 135–145)
Sodium: 137 mmol/L (ref 135–145)
Sodium: 138 mmol/L (ref 135–145)
TCO2: 27 mmol/L (ref 22–32)
TCO2: 26 mmol/L (ref 22–32)
TCO2: 25 mmol/L (ref 22–32)
TCO2: 24 mmol/L (ref 22–32)
TCO2: 24 mmol/L (ref 22–32)
TCO2: 25 mmol/L (ref 22–32)
pCO2 arterial: 41.7 mm[Hg] (ref 32–48)
pCO2 arterial: 35.3 mm[Hg] (ref 32–48)
pCO2 arterial: 35.8 mm[Hg] (ref 32–48)
pCO2 arterial: 40.4 mm[Hg] (ref 32–48)
pCO2 arterial: 41.7 mm[Hg] (ref 32–48)
pCO2 arterial: 43.6 mm[Hg] (ref 32–48)
pH, Arterial: 7.395 (ref 7.35–7.45)
pH, Arterial: 7.465 — ABNORMAL HIGH (ref 7.35–7.45)
pH, Arterial: 7.428 (ref 7.35–7.45)
pH, Arterial: 7.356 (ref 7.35–7.45)
pH, Arterial: 7.339 — ABNORMAL LOW (ref 7.35–7.45)
pH, Arterial: 7.332 — ABNORMAL LOW (ref 7.35–7.45)
pO2, Arterial: 364 mm[Hg] — ABNORMAL HIGH (ref 83–108)
pO2, Arterial: 247 mm[Hg] — ABNORMAL HIGH (ref 83–108)
pO2, Arterial: 140 mm[Hg] — ABNORMAL HIGH (ref 83–108)
pO2, Arterial: 65 mm[Hg] — ABNORMAL LOW (ref 83–108)
pO2, Arterial: 82 mm[Hg] — ABNORMAL LOW (ref 83–108)
pO2, Arterial: 74 mm[Hg] — ABNORMAL LOW (ref 83–108)

## 2022-12-13 LAB — POCT I-STAT EG7
Acid-Base Excess: 0 mmol/L (ref 0.0–2.0)
Bicarbonate: 24.8 mmol/L (ref 20.0–28.0)
Calcium, Ion: 1.08 mmol/L — ABNORMAL LOW (ref 1.15–1.40)
HCT: 26 % — ABNORMAL LOW (ref 36.0–46.0)
Hemoglobin: 8.8 g/dL — ABNORMAL LOW (ref 12.0–15.0)
O2 Saturation: 81 %
Potassium: 6.2 mmol/L — ABNORMAL HIGH (ref 3.5–5.1)
Sodium: 135 mmol/L (ref 135–145)
TCO2: 26 mmol/L (ref 22–32)
pCO2, Ven: 40.6 mm[Hg] — ABNORMAL LOW (ref 44–60)
pH, Ven: 7.394 (ref 7.25–7.43)
pO2, Ven: 45 mm[Hg] (ref 32–45)

## 2022-12-13 LAB — COMPREHENSIVE METABOLIC PANEL
ALT: 17 U/L (ref 0–44)
AST: 19 U/L (ref 15–41)
Albumin: 3.1 g/dL — ABNORMAL LOW (ref 3.5–5.0)
Alkaline Phosphatase: 53 U/L (ref 38–126)
Anion gap: 9 (ref 5–15)
BUN: 11 mg/dL (ref 8–23)
CO2: 25 mmol/L (ref 22–32)
Calcium: 9 mg/dL (ref 8.9–10.3)
Chloride: 100 mmol/L (ref 98–111)
Creatinine, Ser: 0.87 mg/dL (ref 0.44–1.00)
GFR, Estimated: >60 mL/min (ref >60)
Glucose, Bld: 110 mg/dL — ABNORMAL HIGH (ref 70–99)
Potassium: 3.5 mmol/L (ref 3.5–5.1)
Sodium: 134 mmol/L — ABNORMAL LOW (ref 135–145)
Total Bilirubin: 0.6 mg/dL (ref 0.3–1.2)
Total Protein: 5.3 g/dL — ABNORMAL LOW (ref 6.5–8.1)

## 2022-12-13 LAB — POCT I-STAT, CHEM 8
BUN: 9 mg/dL (ref 8–23)
BUN: 8 mg/dL (ref 8–23)
BUN: 9 mg/dL (ref 8–23)
BUN: 8 mg/dL (ref 8–23)
Calcium, Ion: 1.26 mmol/L (ref 1.15–1.40)
Calcium, Ion: 1.18 mmol/L (ref 1.15–1.40)
Calcium, Ion: 1.08 mmol/L — ABNORMAL LOW (ref 1.15–1.40)
Calcium, Ion: 1.08 mmol/L — ABNORMAL LOW (ref 1.15–1.40)
Chloride: 98 mmol/L (ref 98–111)
Chloride: 99 mmol/L (ref 98–111)
Chloride: 96 mmol/L — ABNORMAL LOW (ref 98–111)
Chloride: 99 mmol/L (ref 98–111)
Creatinine, Ser: 0.7 mg/dL (ref 0.44–1.00)
Creatinine, Ser: 0.6 mg/dL (ref 0.44–1.00)
Creatinine, Ser: 0.6 mg/dL (ref 0.44–1.00)
Creatinine, Ser: 0.6 mg/dL (ref 0.44–1.00)
Glucose, Bld: 140 mg/dL — ABNORMAL HIGH (ref 70–99)
Glucose, Bld: 117 mg/dL — ABNORMAL HIGH (ref 70–99)
Glucose, Bld: 137 mg/dL — ABNORMAL HIGH (ref 70–99)
Glucose, Bld: 135 mg/dL — ABNORMAL HIGH (ref 70–99)
HCT: 35 % — ABNORMAL LOW (ref 36.0–46.0)
HCT: 32 % — ABNORMAL LOW (ref 36.0–46.0)
HCT: 27 % — ABNORMAL LOW (ref 36.0–46.0)
HCT: 29 % — ABNORMAL LOW (ref 36.0–46.0)
Hemoglobin: 11.9 g/dL — ABNORMAL LOW (ref 12.0–15.0)
Hemoglobin: 10.9 g/dL — ABNORMAL LOW (ref 12.0–15.0)
Hemoglobin: 9.2 g/dL — ABNORMAL LOW (ref 12.0–15.0)
Hemoglobin: 9.9 g/dL — ABNORMAL LOW (ref 12.0–15.0)
Potassium: 3.5 mmol/L (ref 3.5–5.1)
Potassium: 3.2 mmol/L — ABNORMAL LOW (ref 3.5–5.1)
Potassium: 4.4 mmol/L (ref 3.5–5.1)
Potassium: 4.2 mmol/L (ref 3.5–5.1)
Sodium: 135 mmol/L (ref 135–145)
Sodium: 136 mmol/L (ref 135–145)
Sodium: 133 mmol/L — ABNORMAL LOW (ref 135–145)
Sodium: 136 mmol/L (ref 135–145)
TCO2: 24 mmol/L (ref 22–32)
TCO2: 23 mmol/L (ref 22–32)
TCO2: 26 mmol/L (ref 22–32)
TCO2: 22 mmol/L (ref 22–32)

## 2022-12-13 LAB — HEMOGLOBIN A1C
Hgb A1c MFr Bld: 5.7 % — ABNORMAL HIGH (ref 4.8–5.6)
Mean Plasma Glucose: 116.89 mg/dL

## 2022-12-13 LAB — PROTIME-INR
INR: 1 (ref 0.8–1.2)
INR: 1.4 — ABNORMAL HIGH (ref 0.8–1.2)
Prothrombin Time: 13.2 s (ref 11.4–15.2)
Prothrombin Time: 17.6 s — ABNORMAL HIGH (ref 11.4–15.2)

## 2022-12-13 LAB — HEMOGLOBIN AND HEMATOCRIT, BLOOD
HCT: 25.2 % — ABNORMAL LOW (ref 36.0–46.0)
Hemoglobin: 8.7 g/dL — ABNORMAL LOW (ref 12.0–15.0)

## 2022-12-13 LAB — BASIC METABOLIC PANEL
Anion gap: 7 (ref 5–15)
BUN: 9 mg/dL (ref 8–23)
CO2: 26 mmol/L (ref 22–32)
Calcium: 7.7 mg/dL — ABNORMAL LOW (ref 8.9–10.3)
Chloride: 104 mmol/L (ref 98–111)
Creatinine, Ser: 0.72 mg/dL (ref 0.44–1.00)
GFR, Estimated: >60 mL/min (ref >60)
Glucose, Bld: 148 mg/dL — ABNORMAL HIGH (ref 70–99)
Potassium: 4.1 mmol/L (ref 3.5–5.1)
Sodium: 137 mmol/L (ref 135–145)

## 2022-12-13 LAB — APTT
aPTT: 33 s (ref 24–36)
aPTT: 38 s — ABNORMAL HIGH (ref 24–36)

## 2022-12-13 LAB — GLUCOSE, CAPILLARY
Glucose-Capillary: 140 mg/dL — ABNORMAL HIGH (ref 70–99)
Glucose-Capillary: 124 mg/dL — ABNORMAL HIGH (ref 70–99)
Glucose-Capillary: 151 mg/dL — ABNORMAL HIGH (ref 70–99)
Glucose-Capillary: 143 mg/dL — ABNORMAL HIGH (ref 70–99)
Glucose-Capillary: 146 mg/dL — ABNORMAL HIGH (ref 70–99)
Glucose-Capillary: 174 mg/dL — ABNORMAL HIGH (ref 70–99)
Glucose-Capillary: 148 mg/dL — ABNORMAL HIGH (ref 70–99)

## 2022-12-13 LAB — MAGNESIUM: Magnesium: 2.3 mg/dL (ref 1.7–2.4)

## 2022-12-13 LAB — PLATELET COUNT: Platelets: 126 10*3/uL — ABNORMAL LOW (ref 150–400)

## 2022-12-13 LAB — LIPOPROTEIN A (LPA): Lipoprotein (a): 37.7 nmol/L — ABNORMAL HIGH (ref <75.0)

## 2022-12-13 LAB — SARS CORONAVIRUS 2 BY RT PCR: SARS Coronavirus 2 by RT PCR: NEGATIVE

## 2022-12-13 SURGERY — CORONARY ARTERY BYPASS GRAFTING (CABG)
Anesthesia: General | Site: Chest

## 2022-12-13 MED ORDER — HEPARIN SODIUM (PORCINE) 1000 UNIT/ML IJ SOLN
INTRAMUSCULAR | Status: DC | PRN
  Administered 2022-12-13: 23000 [IU] via INTRAVENOUS
  Administered 2022-12-13: 2000 [IU] via INTRAVENOUS

## 2022-12-13 MED ORDER — CEFAZOLIN SODIUM-DEXTROSE 2-4 GM/100ML-% IV SOLN
2.0000 g | Freq: Three times a day (TID) | INTRAVENOUS | Status: AC
  Administered 2022-12-13 – 2022-12-15 (×6): 2 g via INTRAVENOUS
  Filled 2022-12-13 (×6): qty 100

## 2022-12-13 MED ORDER — SODIUM CHLORIDE 0.9% FLUSH: 3.0000 mL | INTRAVENOUS | Status: DC | PRN

## 2022-12-13 MED ORDER — EPHEDRINE 5 MG/ML INJ
INTRAVENOUS | Status: AC
  Filled 2022-12-13: qty 5

## 2022-12-13 MED ORDER — VASOPRESSIN 20 UNIT/ML IV SOLN
INTRAVENOUS | Status: AC
  Filled 2022-12-13: qty 1

## 2022-12-13 MED ORDER — PROPOFOL 10 MG/ML IV BOLUS
INTRAVENOUS | Status: AC
  Filled 2022-12-13: qty 20

## 2022-12-13 MED ORDER — LACTATED RINGERS IV SOLN: INTRAVENOUS | Status: DC

## 2022-12-13 MED ORDER — DEXMEDETOMIDINE HCL IN NACL 80 MCG/20ML IV SOLN
INTRAVENOUS | Status: AC
  Filled 2022-12-13: qty 20

## 2022-12-13 MED ORDER — ROCURONIUM BROMIDE 10 MG/ML (PF) SYRINGE
PREFILLED_SYRINGE | INTRAVENOUS | Status: DC | PRN
  Administered 2022-12-13 (×2): 50 mg via INTRAVENOUS
  Administered 2022-12-13: 100 mg via INTRAVENOUS

## 2022-12-13 MED ORDER — PANTOPRAZOLE SODIUM 40 MG PO TBEC
40.0000 mg | DELAYED_RELEASE_TABLET | Freq: Every day | ORAL | Status: DC
  Administered 2022-12-15 – 2022-12-21 (×7): 40 mg via ORAL
  Filled 2022-12-13 (×7): qty 1

## 2022-12-13 MED ORDER — PROPOFOL 10 MG/ML IV BOLUS
INTRAVENOUS | Status: DC | PRN
Start: 2022-12-13 — End: 2022-12-13
  Administered 2022-12-13: 50 mg via INTRAVENOUS
  Administered 2022-12-13: 40 mg via INTRAVENOUS
  Administered 2022-12-13: 30 mg via INTRAVENOUS
  Administered 2022-12-13: 20 mg via INTRAVENOUS

## 2022-12-13 MED ORDER — INFLUENZA VAC A&B SURF ANT ADJ 0.5 ML IM SUSY: 0.5000 mL | PREFILLED_SYRINGE | INTRAMUSCULAR | Status: DC

## 2022-12-13 MED ORDER — DOCUSATE SODIUM 100 MG PO CAPS
200.0000 mg | ORAL_CAPSULE | Freq: Every day | ORAL | Status: DC
  Administered 2022-12-14 – 2022-12-21 (×8): 200 mg via ORAL
  Filled 2022-12-13 (×8): qty 2

## 2022-12-13 MED ORDER — INSULIN REGULAR(HUMAN) IN NACL 100-0.9 UT/100ML-% IV SOLN: INTRAVENOUS | Status: DC

## 2022-12-13 MED ORDER — ASPIRIN 81 MG PO CHEW
324.0000 mg | CHEWABLE_TABLET | Freq: Once | ORAL | Status: AC
  Administered 2022-12-13: 324 mg via ORAL
  Filled 2022-12-13: qty 4

## 2022-12-13 MED ORDER — BISACODYL 5 MG PO TBEC
10.0000 mg | DELAYED_RELEASE_TABLET | Freq: Every day | ORAL | Status: DC
  Administered 2022-12-14 – 2022-12-21 (×8): 10 mg via ORAL
  Filled 2022-12-13 (×8): qty 2

## 2022-12-13 MED ORDER — PHENYLEPHRINE 80 MCG/ML (10ML) SYRINGE FOR IV PUSH (FOR BLOOD PRESSURE SUPPORT)
PREFILLED_SYRINGE | INTRAVENOUS | Status: AC
  Filled 2022-12-13: qty 10

## 2022-12-13 MED ORDER — ALBUMIN HUMAN 5 % IV SOLN: INTRAVENOUS | Status: DC | PRN

## 2022-12-13 MED ORDER — PHENYLEPHRINE HCL-NACL 20-0.9 MG/250ML-% IV SOLN
0.0000 ug/min | INTRAVENOUS | Status: DC
  Administered 2022-12-14: 50 ug/min via INTRAVENOUS
  Filled 2022-12-13: qty 250

## 2022-12-13 MED ORDER — FENTANYL CITRATE (PF) 250 MCG/5ML IJ SOLN
INTRAMUSCULAR | Status: DC | PRN
  Administered 2022-12-13: 25 ug via INTRAVENOUS
  Administered 2022-12-13: 50 ug via INTRAVENOUS
  Administered 2022-12-13 (×5): 100 ug via INTRAVENOUS
  Administered 2022-12-13 (×2): 50 ug via INTRAVENOUS
  Administered 2022-12-13: 100 ug via INTRAVENOUS
  Administered 2022-12-13: 25 ug via INTRAVENOUS
  Administered 2022-12-13 (×2): 50 ug via INTRAVENOUS

## 2022-12-13 MED ORDER — POTASSIUM CHLORIDE 10 MEQ/50ML IV SOLN
10.0000 meq | INTRAVENOUS | Status: AC
  Administered 2022-12-13 (×3): 10 meq via INTRAVENOUS

## 2022-12-13 MED ORDER — METOPROLOL TARTRATE 25 MG/10 ML ORAL SUSPENSION
12.5000 mg | Freq: Two times a day (BID) | ORAL | Status: DC
  Filled 2022-12-13 (×6): qty 5

## 2022-12-13 MED ORDER — MIDAZOLAM HCL (PF) 5 MG/ML IJ SOLN
INTRAMUSCULAR | Status: DC | PRN
  Administered 2022-12-13 (×2): 1 mg via INTRAVENOUS

## 2022-12-13 MED ORDER — SODIUM CHLORIDE 0.9% FLUSH
3.0000 mL | Freq: Two times a day (BID) | INTRAVENOUS | Status: DC
  Administered 2022-12-14 – 2022-12-15 (×3): 3 mL via INTRAVENOUS

## 2022-12-13 MED ORDER — ACETAMINOPHEN 160 MG/5ML PO SOLN
650.0000 mg | Freq: Once | ORAL | Status: AC
  Administered 2022-12-13: 650 mg
  Filled 2022-12-13: qty 20.3

## 2022-12-13 MED ORDER — SODIUM CHLORIDE (PF) 0.9 % IJ SOLN: OROMUCOSAL | Status: DC | PRN

## 2022-12-13 MED ORDER — VANCOMYCIN HCL IN DEXTROSE 1-5 GM/200ML-% IV SOLN
1000.0000 mg | Freq: Once | INTRAVENOUS | Status: AC
  Administered 2022-12-13: 1000 mg via INTRAVENOUS
  Filled 2022-12-13: qty 200

## 2022-12-13 MED ORDER — PHENYLEPHRINE 80 MCG/ML (10ML) SYRINGE FOR IV PUSH (FOR BLOOD PRESSURE SUPPORT)
PREFILLED_SYRINGE | INTRAVENOUS | Status: DC | PRN
  Administered 2022-12-13 (×2): 80 ug via INTRAVENOUS
  Administered 2022-12-13: 40 ug via INTRAVENOUS
  Administered 2022-12-13: 80 ug via INTRAVENOUS
  Administered 2022-12-13: 40 ug via INTRAVENOUS
  Administered 2022-12-13 (×2): 80 ug via INTRAVENOUS

## 2022-12-13 MED ORDER — SODIUM CHLORIDE 0.45 % IV SOLN: INTRAVENOUS | Status: DC | PRN

## 2022-12-13 MED ORDER — METOPROLOL TARTRATE 12.5 MG HALF TABLET
12.5000 mg | ORAL_TABLET | Freq: Two times a day (BID) | ORAL | Status: DC
  Administered 2022-12-16 – 2022-12-18 (×5): 12.5 mg via ORAL
  Filled 2022-12-13 (×8): qty 1

## 2022-12-13 MED ORDER — FENTANYL CITRATE (PF) 250 MCG/5ML IJ SOLN
INTRAMUSCULAR | Status: AC
  Filled 2022-12-13: qty 5

## 2022-12-13 MED ORDER — ASPIRIN 325 MG PO TBEC
325.0000 mg | DELAYED_RELEASE_TABLET | Freq: Every day | ORAL | Status: DC
  Administered 2022-12-14 – 2022-12-16 (×3): 325 mg via ORAL
  Filled 2022-12-13 (×3): qty 1

## 2022-12-13 MED ORDER — ACETAMINOPHEN 500 MG PO TABS
1000.0000 mg | ORAL_TABLET | Freq: Four times a day (QID) | ORAL | Status: AC
  Administered 2022-12-14 – 2022-12-18 (×16): 1000 mg via ORAL
  Filled 2022-12-13 (×15): qty 2

## 2022-12-13 MED ORDER — SODIUM BICARBONATE 8.4 % IV SOLN
50.0000 meq | Freq: Once | INTRAVENOUS | Status: AC
  Administered 2022-12-13: 50 meq via INTRAVENOUS

## 2022-12-13 MED ORDER — BISACODYL 10 MG RE SUPP
10.0000 mg | Freq: Every day | RECTAL | Status: DC
  Filled 2022-12-13: qty 1

## 2022-12-13 MED ORDER — NITROGLYCERIN IN D5W 200-5 MCG/ML-% IV SOLN: 0.0000 ug/min | INTRAVENOUS | Status: DC

## 2022-12-13 MED ORDER — CHLORHEXIDINE GLUCONATE 0.12 % MT SOLN
15.0000 mL | OROMUCOSAL | Status: AC
  Administered 2022-12-13: 15 mL via OROMUCOSAL
  Filled 2022-12-13: qty 15

## 2022-12-13 MED ORDER — DEXTROSE 50 % IV SOLN: 0.0000 mL | INTRAVENOUS | Status: DC | PRN

## 2022-12-13 MED ORDER — MIDAZOLAM HCL 2 MG/2ML IJ SOLN
2.0000 mg | INTRAMUSCULAR | Status: DC | PRN
  Administered 2022-12-13: 2 mg via INTRAVENOUS
  Filled 2022-12-13: qty 2

## 2022-12-13 MED ORDER — ACETAMINOPHEN 160 MG/5ML PO SOLN
1000.0000 mg | Freq: Four times a day (QID) | ORAL | Status: AC
  Administered 2022-12-15: 1000 mg
  Filled 2022-12-13: qty 40.6

## 2022-12-13 MED ORDER — LIDOCAINE 2% (20 MG/ML) 5 ML SYRINGE
INTRAMUSCULAR | Status: AC
  Filled 2022-12-13: qty 5

## 2022-12-13 MED ORDER — NITROGLYCERIN 0.2 MG/ML ON CALL CATH LAB
INTRAVENOUS | Status: DC | PRN
Start: 2022-12-13 — End: 2022-12-13
  Administered 2022-12-13: 20 ug via INTRAVENOUS

## 2022-12-13 MED ORDER — ROCURONIUM BROMIDE 10 MG/ML (PF) SYRINGE
PREFILLED_SYRINGE | INTRAVENOUS | Status: AC
  Filled 2022-12-13: qty 10

## 2022-12-13 MED ORDER — PROTAMINE SULFATE 10 MG/ML IV SOLN
INTRAVENOUS | Status: DC | PRN
  Administered 2022-12-13: 30 mg via INTRAVENOUS
  Administered 2022-12-13: 200 mg via INTRAVENOUS

## 2022-12-13 MED ORDER — DEXMEDETOMIDINE HCL IN NACL 400 MCG/100ML IV SOLN: 0.0000 ug/kg/h | INTRAVENOUS | Status: DC

## 2022-12-13 MED ORDER — MORPHINE SULFATE (PF) 2 MG/ML IV SOLN
1.0000 mg | INTRAVENOUS | Status: DC | PRN
  Administered 2022-12-13 – 2022-12-14 (×2): 2 mg via INTRAVENOUS
  Filled 2022-12-13 (×2): qty 1

## 2022-12-13 MED ORDER — ORAL CARE MOUTH RINSE
15.0000 mL | OROMUCOSAL | Status: DC
  Administered 2022-12-13 (×2): 15 mL via OROMUCOSAL

## 2022-12-13 MED ORDER — PLASMA-LYTE A IV SOLN: INTRAVENOUS | Status: DC | PRN

## 2022-12-13 MED ORDER — TRAMADOL HCL 50 MG PO TABS
50.0000 mg | ORAL_TABLET | ORAL | Status: DC | PRN
  Administered 2022-12-14: 50 mg via ORAL
  Administered 2022-12-16 – 2022-12-20 (×11): 100 mg via ORAL
  Filled 2022-12-13 (×6): qty 2
  Filled 2022-12-13: qty 1
  Filled 2022-12-13 (×6): qty 2

## 2022-12-13 MED ORDER — LACTATED RINGERS IV SOLN: INTRAVENOUS | Status: DC | PRN

## 2022-12-13 MED ORDER — LIDOCAINE HCL (CARDIAC) PF 100 MG/5ML IV SOSY
PREFILLED_SYRINGE | INTRAVENOUS | Status: DC | PRN
Start: 2022-12-13 — End: 2022-12-13
  Administered 2022-12-13: 100 mg via INTRATRACHEAL

## 2022-12-13 MED ORDER — HEMOSTATIC AGENTS (NO CHARGE) OPTIME
TOPICAL | Status: DC | PRN
  Administered 2022-12-13: 1 via TOPICAL

## 2022-12-13 MED ORDER — ASPIRIN 81 MG PO CHEW
324.0000 mg | CHEWABLE_TABLET | Freq: Every day | ORAL | Status: DC
  Administered 2022-12-17 – 2022-12-18 (×2): 324 mg
  Filled 2022-12-13 (×3): qty 4

## 2022-12-13 MED ORDER — CALCIUM CHLORIDE 10 % IV SOLN
INTRAVENOUS | Status: DC | PRN
Start: 2022-12-13 — End: 2022-12-13
  Administered 2022-12-13 (×2): 100 mg via INTRAVENOUS

## 2022-12-13 MED ORDER — ONDANSETRON HCL 4 MG/2ML IJ SOLN
4.0000 mg | Freq: Four times a day (QID) | INTRAMUSCULAR | Status: DC | PRN
  Administered 2022-12-16 – 2022-12-18 (×2): 4 mg via INTRAVENOUS
  Filled 2022-12-13 (×2): qty 2

## 2022-12-13 MED ORDER — EPHEDRINE SULFATE-NACL 50-0.9 MG/10ML-% IV SOSY
PREFILLED_SYRINGE | INTRAVENOUS | Status: DC | PRN
  Administered 2022-12-13: 2.5 mg via INTRAVENOUS
  Administered 2022-12-13: 5 mg via INTRAVENOUS
  Administered 2022-12-13 (×2): 7.5 mg via INTRAVENOUS
  Administered 2022-12-13 (×4): 5 mg via INTRAVENOUS

## 2022-12-13 MED ORDER — METOCLOPRAMIDE HCL 5 MG/ML IJ SOLN
10.0000 mg | Freq: Four times a day (QID) | INTRAMUSCULAR | Status: AC
  Administered 2022-12-13 – 2022-12-14 (×6): 10 mg via INTRAVENOUS
  Filled 2022-12-13 (×6): qty 2

## 2022-12-13 MED ORDER — SODIUM CHLORIDE 0.9 % IV SOLN
250.0000 mL | INTRAVENOUS | Status: DC
  Administered 2022-12-14: 250 mL via INTRAVENOUS

## 2022-12-13 MED ORDER — OXYCODONE HCL 5 MG PO TABS
5.0000 mg | ORAL_TABLET | ORAL | Status: DC | PRN
  Administered 2022-12-14 (×3): 10 mg via ORAL
  Administered 2022-12-14 – 2022-12-15 (×5): 5 mg via ORAL
  Administered 2022-12-16: 10 mg via ORAL
  Administered 2022-12-16: 5 mg via ORAL
  Administered 2022-12-16 – 2022-12-21 (×15): 10 mg via ORAL
  Filled 2022-12-13 (×9): qty 2
  Filled 2022-12-13 (×3): qty 1
  Filled 2022-12-13 (×3): qty 2
  Filled 2022-12-13: qty 1
  Filled 2022-12-13 (×3): qty 2
  Filled 2022-12-13: qty 1
  Filled 2022-12-13: qty 2
  Filled 2022-12-13: qty 1
  Filled 2022-12-13 (×3): qty 2

## 2022-12-13 MED ORDER — PANTOPRAZOLE SODIUM 40 MG IV SOLR
40.0000 mg | Freq: Every day | INTRAVENOUS | Status: AC
  Administered 2022-12-13 – 2022-12-14 (×2): 40 mg via INTRAVENOUS
  Filled 2022-12-13 (×2): qty 10

## 2022-12-13 MED ORDER — 0.9 % SODIUM CHLORIDE (POUR BTL) OPTIME
TOPICAL | Status: DC | PRN
  Administered 2022-12-13: 5000 mL

## 2022-12-13 MED ORDER — MAGNESIUM SULFATE 4 GM/100ML IV SOLN
4.0000 g | Freq: Once | INTRAVENOUS | Status: AC
  Administered 2022-12-13: 4 g via INTRAVENOUS
  Filled 2022-12-13: qty 100

## 2022-12-13 MED ORDER — METOPROLOL TARTRATE 5 MG/5ML IV SOLN: 2.5000 mg | INTRAVENOUS | Status: DC | PRN

## 2022-12-13 MED ORDER — ALBUMIN HUMAN 5 % IV SOLN
250.0000 mL | INTRAVENOUS | Status: DC | PRN
  Administered 2022-12-13 (×3): 12.5 g via INTRAVENOUS
  Filled 2022-12-13: qty 250

## 2022-12-13 MED ORDER — MIDAZOLAM HCL (PF) 10 MG/2ML IJ SOLN
INTRAMUSCULAR | Status: AC
  Filled 2022-12-13: qty 2

## 2022-12-13 MED ORDER — SODIUM CHLORIDE 0.9 % IV SOLN: INTRAVENOUS | Status: DC

## 2022-12-13 MED ORDER — HEPARIN SODIUM (PORCINE) 1000 UNIT/ML IJ SOLN
INTRAMUSCULAR | Status: AC
  Filled 2022-12-13: qty 1

## 2022-12-13 MED ORDER — CHLORHEXIDINE GLUCONATE CLOTH 2 % EX PADS
6.0000 | MEDICATED_PAD | Freq: Every day | CUTANEOUS | Status: DC
  Administered 2022-12-13 – 2022-12-15 (×3): 6 via TOPICAL

## 2022-12-13 MED ORDER — ORAL CARE MOUTH RINSE: 15.0000 mL | OROMUCOSAL | Status: DC | PRN

## 2022-12-13 SURGICAL SUPPLY — 92 items
ADH SKN CLS APL DERMABOND .7 (GAUZE/BANDAGES/DRESSINGS) ×2
ANTIFOG SOL W/FOAM PAD STRL (MISCELLANEOUS) ×2
BAG DECANTER FOR FLEXI CONT (MISCELLANEOUS) ×2 IMPLANT
BLADE CLIPPER SURG (BLADE) ×2 IMPLANT
BLADE STERNUM SYSTEM 6 (BLADE) ×2 IMPLANT
BNDG CMPR 5X4 KNIT ELC UNQ LF (GAUZE/BANDAGES/DRESSINGS) ×2
BNDG CMPR 6 X 5 YARDS HK CLSR (GAUZE/BANDAGES/DRESSINGS) ×2
BNDG ELASTIC 4INX 5YD STR LF (GAUZE/BANDAGES/DRESSINGS) IMPLANT
BNDG ELASTIC 6INX 5YD STR LF (GAUZE/BANDAGES/DRESSINGS) IMPLANT
BNDG GAUZE DERMACEA FLUFF 4 (GAUZE/BANDAGES/DRESSINGS) ×2 IMPLANT
BNDG GZE DERMACEA 4 6PLY (GAUZE/BANDAGES/DRESSINGS) ×2
CANISTER SUCT 3000ML PPV (MISCELLANEOUS) ×2 IMPLANT
CANNULA EZ GLIDE AORTIC 21FR (CANNULA) ×2 IMPLANT
CANNULA MC2 2 STG 36/46 NON-V (CANNULA) IMPLANT
CATH CPB KIT HENDRICKSON (MISCELLANEOUS) ×2 IMPLANT
CATH ROBINSON RED A/P 18FR (CATHETERS) ×2 IMPLANT
CATH THORACIC 36FR (CATHETERS) ×2 IMPLANT
CATH THORACIC 36FR RT ANG (CATHETERS) ×2 IMPLANT
CLIP FOGARTY SPRING 6M (CLIP) IMPLANT
CLIP TI WIDE RED SMALL 24 (CLIP) IMPLANT
CONN Y 3/8X3/8X3/8 BEN (MISCELLANEOUS) IMPLANT
CONTAINER PROTECT SURGISLUSH (MISCELLANEOUS) ×4 IMPLANT
DERMABOND ADVANCED .7 DNX12 (GAUZE/BANDAGES/DRESSINGS) IMPLANT
DRAPE CARDIOVASCULAR INCISE (DRAPES) ×2
DRAPE SRG 135X102X78XABS (DRAPES) ×2 IMPLANT
DRAPE WARM FLUID 44X44 (DRAPES) ×2 IMPLANT
DRSG COVADERM 4X14 (GAUZE/BANDAGES/DRESSINGS) ×2 IMPLANT
ELECT REM PT RETURN 9FT ADLT (ELECTROSURGICAL) ×4
ELECTRODE REM PT RTRN 9FT ADLT (ELECTROSURGICAL) ×4 IMPLANT
FELT TEFLON 1X6 (MISCELLANEOUS) ×4 IMPLANT
GAUZE SPONGE 4X4 12PLY STRL (GAUZE/BANDAGES/DRESSINGS) ×4 IMPLANT
GLOVE SS BIOGEL STRL SZ 7.5 (GLOVE) ×2 IMPLANT
GLOVE SURG SIGNA 7.5 PF LTX (GLOVE) ×6 IMPLANT
GOWN STRL REUS W/ TWL LRG LVL3 (GOWN DISPOSABLE) ×8 IMPLANT
GOWN STRL REUS W/ TWL XL LVL3 (GOWN DISPOSABLE) ×4 IMPLANT
GOWN STRL REUS W/TWL LRG LVL3 (GOWN DISPOSABLE) ×8
GOWN STRL REUS W/TWL XL LVL3 (GOWN DISPOSABLE) ×4
HEMOSTAT POWDER SURGIFOAM 1G (HEMOSTASIS) ×6 IMPLANT
HEMOSTAT SURGICEL 2X14 (HEMOSTASIS) ×2 IMPLANT
INSERT FOGARTY XLG (MISCELLANEOUS) IMPLANT
KIT BASIN OR (CUSTOM PROCEDURE TRAY) ×2 IMPLANT
KIT SUCTION CATH 14FR (SUCTIONS) ×4 IMPLANT
KIT TURNOVER KIT B (KITS) ×2 IMPLANT
KIT VASOVIEW HEMOPRO 2 VH 4000 (KITS) ×2 IMPLANT
MARKER GRAFT CORONARY BYPASS (MISCELLANEOUS) ×6 IMPLANT
NS IRRIG 1000ML POUR BTL (IV SOLUTION) ×10 IMPLANT
PACK E OPEN HEART (SUTURE) ×2 IMPLANT
PACK OPEN HEART (CUSTOM PROCEDURE TRAY) ×2 IMPLANT
PAD ARMBOARD 7.5X6 YLW CONV (MISCELLANEOUS) ×4 IMPLANT
PAD ELECT DEFIB RADIOL ZOLL (MISCELLANEOUS) ×2 IMPLANT
PENCIL BUTTON HOLSTER BLD 10FT (ELECTRODE) ×2 IMPLANT
POSITIONER HEAD DONUT 9IN (MISCELLANEOUS) ×2 IMPLANT
PUNCH AORTIC ROTATE 4.0MM (MISCELLANEOUS) IMPLANT
PUNCH AORTIC ROTATE 4.5MM 8IN (MISCELLANEOUS) IMPLANT
PUNCH AORTIC ROTATE 5MM 8IN (MISCELLANEOUS) IMPLANT
SET MPS 3-ND DEL (MISCELLANEOUS) IMPLANT
SOLUTION ANTFG W/FOAM PAD STRL (MISCELLANEOUS) IMPLANT
SPONGE T-LAP 18X18 ~~LOC~~+RFID (SPONGE) ×8 IMPLANT
SPONGE T-LAP 4X18 ~~LOC~~+RFID (SPONGE) ×2 IMPLANT
SUPPORT HEART JANKE-BARRON (MISCELLANEOUS) ×2 IMPLANT
SUT BONE WAX W31G (SUTURE) ×2 IMPLANT
SUT MNCRL AB 4-0 PS2 18 (SUTURE) IMPLANT
SUT PROLENE 3 0 SH DA (SUTURE) ×2 IMPLANT
SUT PROLENE 4 0 RB 1 (SUTURE)
SUT PROLENE 4 0 SH DA (SUTURE) IMPLANT
SUT PROLENE 4-0 RB1 .5 CRCL 36 (SUTURE) IMPLANT
SUT PROLENE 5 0 C 1 36 (SUTURE) IMPLANT
SUT PROLENE 6 0 C 1 30 (SUTURE) ×4 IMPLANT
SUT PROLENE 7 0 BV 1 (SUTURE) IMPLANT
SUT PROLENE 7 0 BV1 MDA (SUTURE) ×2 IMPLANT
SUT PROLENE 8 0 BV175 6 (SUTURE) IMPLANT
SUT STEEL 6MS V (SUTURE) ×2 IMPLANT
SUT STEEL STERNAL CCS#1 18IN (SUTURE) IMPLANT
SUT STEEL SZ 6 DBL 3X14 BALL (SUTURE) ×2 IMPLANT
SUT VIC AB 1 CTX 36 (SUTURE) ×6
SUT VIC AB 1 CTX36XBRD ANBCTR (SUTURE) ×4 IMPLANT
SUT VIC AB 2-0 CT1 27 (SUTURE) ×2
SUT VIC AB 2-0 CT1 TAPERPNT 27 (SUTURE) IMPLANT
SUT VIC AB 2-0 CTX 27 (SUTURE) IMPLANT
SUT VIC AB 3-0 SH 27 (SUTURE)
SUT VIC AB 3-0 SH 27X BRD (SUTURE) IMPLANT
SUT VIC AB 3-0 X1 27 (SUTURE) IMPLANT
SUT VICRYL 4-0 PS2 18IN ABS (SUTURE) IMPLANT
SYSTEM SAHARA CHEST DRAIN ATS (WOUND CARE) ×2 IMPLANT
TAPE CLOTH SURG 4X10 WHT LF (GAUZE/BANDAGES/DRESSINGS) IMPLANT
TAPE PAPER 2X10 WHT MICROPORE (GAUZE/BANDAGES/DRESSINGS) IMPLANT
TOWEL GREEN STERILE (TOWEL DISPOSABLE) ×2 IMPLANT
TOWEL GREEN STERILE FF (TOWEL DISPOSABLE) ×2 IMPLANT
TRAY FOLEY SLVR 16FR TEMP STAT (SET/KITS/TRAYS/PACK) ×2 IMPLANT
TUBING LAP HI FLOW INSUFFLATIO (TUBING) ×2 IMPLANT
UNDERPAD 30X36 HEAVY ABSORB (UNDERPADS AND DIAPERS) ×2 IMPLANT
WATER STERILE IRR 1000ML POUR (IV SOLUTION) ×4 IMPLANT

## 2022-12-13 NOTE — Anesthesia Procedure Notes (Signed)
Procedure Name: Intubation Date/Time: 12/13/2022 8:55 AM  Performed by: Marena Chancy, CRNAPre-anesthesia Checklist: Patient identified, Emergency Drugs available, Suction available and Patient being monitored Patient Re-evaluated:Patient Re-evaluated prior to induction Oxygen Delivery Method: Circle System Utilized Preoxygenation: Pre-oxygenation with 100% oxygen Induction Type: IV induction Ventilation: Mask ventilation without difficulty Laryngoscope Size: Miller and 2 Grade View: Grade I Tube type: Oral Tube size: 8.0 mm Number of attempts: 1 Airway Equipment and Method: Stylet and Oral airway Placement Confirmation: ETT inserted through vocal cords under direct vision, positive ETCO2 and breath sounds checked- equal and bilateral Secured at: 22 cm Tube secured with: Tape Dental Injury: Teeth and Oropharynx as per pre-operative assessment

## 2022-12-13 NOTE — Transfer of Care (Signed)
Immediate Anesthesia Transfer of Care Note  Patient: Christine [name]  Procedure(s) Performed: CORONARY ARTERY BYPASS GRAFTING (CABG) TIMES FOUR UTILIZING LEFT INTERNAL MAMMARY ARTERY AND ENDOSCOPIC VEIN HARVEST RIGHT GREATER SAPHENOUS VEIN (Chest) TRANSESOPHAGEAL ECHOCARDIOGRAM  Patient Location: ICU  Anesthesia Type:General  Level of Consciousness: sedated and unresponsive  Airway & Oxygen Therapy: Patient remains intubated per anesthesia plan  Post-op Assessment: Report given to RN and Post -op Vital signs reviewed and stable  Post vital signs: Reviewed and stable  Last Vitals:  Vitals Value Taken Time  BP 104/71 12/13/22 1347  Temp 35.8 C 12/13/22 1356  Pulse 86 12/13/22 1356  Resp 18 12/13/22 1356  SpO2 96 % 12/13/22 1356  Vitals shown include unfiled device data.  Last Pain:  Vitals:   12/13/22 0728  TempSrc: Oral  PainSc:          Complications: No notable events documented.

## 2022-12-13 NOTE — Anesthesia Procedure Notes (Signed)
Central Venous Catheter Insertion Performed by: Em[place]ia Nation, MD, anesthesiologist Start/End10/04/2022 7:50 AM, 12/13/2022 8:00 AM Patient location: Pre-op. Preanesthetic checklist: patient identified, IV checked, site marked, risks and benefits discussed, surgical consent, monitors and equipment checked, pre-op evaluation, timeout performed and anesthesia consent Lidocaine 1% used for infiltration and patient sedated Hand hygiene performed  and maximum sterile barriers used  Catheter size: 8.5 Fr Central line was placed.MAC introducer Swan type:thermodilution Procedure performed using ultrasound guided technique. Ultrasound Notes:anatomy identified, needle tip was noted to be adjacent to the nerve/plexus identified, no ultrasound evidence of intravascular and/or intraneural injection and image(s) printed for medical record Attempts: 1 Following insertion, line sutured, dressing applied and Biopatch. Post procedure assessment: blood return through all ports, free fluid flow and no air  Patient tolerated the procedure well with no immediate complications.

## 2022-12-13 NOTE — Anesthesia Procedure Notes (Signed)
Arterial Line Insertion Start/End10/04/2022 7:35 AM, 12/13/2022 7:40 AM Performed by: Marena Chancy, CRNA, Marena Chancy, CRNA, CRNA  Patient location: Pre-op. Lidocaine 1% used for infiltration and patient sedated Left, radial was placed  Attempts: 1 Procedure performed without using ultrasound guided technique.

## 2022-12-13 NOTE — Procedures (Signed)
Extubation Procedure Note  Patient Details:   Name: Christine [name] DOB: 21-Nov-1933 MRN: [id number]   Airway Documentation:    Vent end date: 12/13/22 Vent end time: 1800   Evaluation  O2 sats: stable throughout Complications: No apparent complications Patient did tolerate procedure well. Bilateral Breath Sounds: Clear   Pt extubated per Rapid Wean Protocol & placed on 4L . Pt met all weaning parameters prior with NIF -30 cmH2O, VC 900 ml, + cuff leak. Pt is able to speak & cough post extubation.  Jacqulynn Cadet 12/13/2022, 6:02 PM

## 2022-12-13 NOTE — Brief Op Note (Addendum)
12/11/2022 - 12/13/2022  3:14 PM  PATIENT:  Christine [name]  89 y.o. female  PRE-OPERATIVE DIAGNOSIS:  Coronary Artery Disease  POST-OPERATIVE DIAGNOSIS:  Coronary Artery Disease  PROCEDURE:  Procedure(s): CORONARY ARTERY BYPASS GRAFTING (CABG) TIMES FOUR UTILIZING LEFT INTERNAL MAMMARY ARTERY AND ENDOSCOPIC VEIN HARVEST RIGHT GREATER SAPHENOUS VEIN (N/A) TRANSESOPHAGEAL ECHOCARDIOGRAM (N/A) Vein harvest time: Vein prep time: LIMA-LAD, SVG-OM1-2, SVG-dist RCA  SURGEON:  Surgeons and Role:    * Loreli Slot, MD - Primary  PHYSICIAN ASSISTANT: WAYNE GOLD PA-C  ASSISTANTS: Virgilio Frees RNFA   ANESTHESIA:   general  EBL:  514 mL   BLOOD ADMINISTERED:none  DRAINS:  LEFT PLEURAL AND MEDIASTINAL CHEST TUBES    LOCAL MEDICATIONS USED:  NONE  SPECIMEN:  No Specimen  DISPOSITION OF SPECIMEN:  N/A  COUNTS:  YES  TOURNIQUET:  * No tourniquets in log *  DICTATION: .Other Dictation: Dictation Number PENDING  PLAN OF CARE: Admit to inpatient   PATIENT DISPOSITION:  ICU - intubated and hemodynamically stable.   Delay start of Pharmacological VTE agent (>24hrs) due to surgical blood loss or risk of bleeding: yes  COMPLICATIONS: NO  KNOWN

## 2022-12-13 NOTE — Interval H&P Note (Signed)
History and Physical Interval Note:  12/13/2022 7:52 AM  Christine [name]  has presented today for surgery, with the diagnosis of CAD.  The various methods of treatment have been discussed with the patient and family. After consideration of risks, benefits and other options for treatment, the patient has consented to  Procedure(s): CORONARY ARTERY BYPASS GRAFTING (CABG) (N/A) TRANSESOPHAGEAL ECHOCARDIOGRAM (N/A) as a surgical intervention.  The patient's history has been reviewed, patient examined, no change in status, stable for surgery.  I have reviewed the patient's chart and labs.  Questions were answered to the patient's satisfaction.     Loreli Slot

## 2022-12-13 NOTE — Anesthesia Postprocedure Evaluation (Signed)
Anesthesia Post Note  Patient: Christine [name]  Procedure(s) Performed: CORONARY ARTERY BYPASS GRAFTING (CABG) TIMES FOUR UTILIZING LEFT INTERNAL MAMMARY ARTERY AND ENDOSCOPIC VEIN HARVEST RIGHT GREATER SAPHENOUS VEIN (Chest) TRANSESOPHAGEAL ECHOCARDIOGRAM     Patient location during evaluation: SICU Anesthesia Type: General Level of consciousness: sedated Pain management: pain level controlled Vital Signs Assessment: post-procedure vital signs reviewed and stable Respiratory status: patient remains intubated per anesthesia plan Cardiovascular status: stable Postop Assessment: no apparent nausea or vomiting Anesthetic complications: no   There were no known notable events for this encounter.  Last Vitals:  Vitals:   12/13/22 0520 12/13/22 0728  BP: (!) 142/63 (!) 170/82  Pulse: (!) 56 (!) 49  Resp: 18 17  Temp: 36.8 C 36.7 C  SpO2: 95% 94%    Last Pain:  Vitals:   12/13/22 0728  TempSrc: Oral  PainSc:                  Callaghan Nation

## 2022-12-13 NOTE — Op Note (Signed)
Christine [name], COUILLARD MEDICAL RECORD NO: [id number] ACCOUNT NO: [id number] DATE OF BIRTH: Feb 20, 1934 FACILITY: [facility] LOCATION: [facility] PHYSICIAN: Salvatore Decent. [name], MD  Operative Report   DATE OF PROCEDURE: 12/13/2022  PREOPERATIVE DIAGNOSIS:  Three-vessel coronary artery disease with new onset angina.  POSTOPERATIVE DIAGNOSIS:  Three-vessel coronary artery disease with new onset angina.  PROCEDURES PERFORMED:   Median sternotomy, extracorporeal circulation,  Coronary artery bypass grafting x 4  Left internal mammary artery to LAD,  Saphenous vein graft to distal right coronary,  Sequential saphenous vein graft to obtuse marginals 1 and 2  Endoscopic vein harvest, right leg.  SURGEON:  Salvatore Decent. Shandora Fetch, MD  ASSISTANT:  Gershon Crane, PA-C.  Experienced assistance was necessary for this case due to surgical complexity.  Gershon Crane assisted by independently harvesting the saphenous vein and closing the leg incisions.  He then assisted with the anastomoses providing exposure, retraction of delicate tissues, suture management and suctioning.  ANESTHESIA:  General.  FINDINGS:  Transesophageal echocardiography showed preserved left ventricular function with no significant valvular pathology.  Saphenous vein and mammary artery are of good quality.  Distal right coronary fair quality, target, remaining targets good quality.  CLINICAL NOTE:  Christine [name] is an 89 year old woman with a history of angina and an abnormal coronary CTA.  She presented with a new onset chest pain and was found to have 3-vessel disease with a complex lesion in her proximal circumflex which was not amenable to percutaneous intervention.  She was referred for coronary artery bypass grafting.  The indications, risks, benefits, and alternatives were discussed in detail with the patient.  She understood and accepted the risks and agreed to proceed.  She does understand that her risk was elevated due to her  advanced age.  OPERATIVE NOTE: [name] was brought to the preoperative holding area on 12/13/2022.  Anesthesia service under direction of Dr. Charlynn Grimes placed a Swan-Ganz catheter and an arterial blood pressure monitoring line.  She was taken to the operating room, anesthetized and intubated.  Dr. Charlynn Grimes performed transesophageal echocardiography.  Please see his separately dictated note for full details of the procedure.  A Foley catheter was placed.  Intravenous antibiotics were administered.  The chest, abdomen and legs were prepped and draped in the usual sterile fashion.  A timeout was performed.  A median sternotomy was performed and the left internal mammary artery was harvested using standard technique.  Simultaneously, Gershon Crane made an incision in the medial aspect of the right leg just below the knee, the greater saphenous vein was harvested endoscopically.  2000 units of heparin was administered during the vessel harvest.  The remainder of the full heparin dose was given prior to opening the pericardium.  After harvesting the conduits, the sternal retractor was placed and was gradually opened over time.  The pericardium was opened.  The ascending aorta was inspected.  There was no palpable atherosclerotic disease.  After confirming adequate anticoagulation with ACT measurement, the aorta was cannulated via concentric 2-0 Ethibond pledgeted pursestring sutures.  A dual stage venous cannula was placed via pursestring suture in the right atrial appendage.  Cardiopulmonary bypass was initiated. Flows were maintained per protocol.  The patient was cooled to 32 degrees Celsius.  The coronary arteries were inspected and anastomotic sites were chosen.  The conduits were inspected and cut to length.  A foam pad was placed in the pericardium to insulate the heart.  A temperature probe was placed in the myocardial septum and a cardioplegia cannula was  placed in the ascending aorta.  The aorta was  crossclamped.  The left ventricle was emptied via the aortic root vent.  Cardiac arrest then was achieved with a combination of cold antegrade blood cardioplegia and topical ice saline.  There was a rapid diastolic arrest and septal cooling to 9 degrees Celsius with 750 mL of cardioplegia.  A reversed saphenous vein graft was placed end-to-side to the distal right coronary.  It was a fair quality target.  There was significant calcification just proximal to the takeoff of the PDA.  The vein graft was placed just distal to the calcification.  A probe did pass into the PDA as well as distally in the distal right coronary.  The end-to-side anastomosis was performed with a running 7-0 Prolene suture.  Probe passed easily.  At the completion of the anastomosis, cardioplegia was administered down the graft and there was good flow and good hemostasis.  Next, a reversed saphenous vein graft was placed sequentially to obtuse marginals 1 and 2.  These were both 1.5 mm good quality targets.  They were relatively high anterolateral branches.  The distal circumflex was not a graftable as it was too small where it could be visualized.  The side-to-side anastomosis and end-to-side anastomosis, both were done with running 7-0 Prolene sutures.  A probe passed easily in each anastomosis. Cardioplegia was administered down the graft and there was good flow and good hemostasis.  The left internal mammary artery was brought through a window in the pericardium.  The distal end was bevelled.  It was then anastomosed end-to-side to the distal LAD.  The LAD was a 1.5 mm good quality target.  The mammary was also a 1.5 mm good quality vessel and the end-to-side anastomosis was performed with a running 8-0 Prolene suture.  At the completion of the anastomosis, the bulldog clamp was briefly removed.  Septal rewarming was noted.  The bulldog clamp was replaced and the mammary pedicle was tacked to the epicardial surface of the heart with  6-0 Prolene sutures.  The vein grafts were cut to length.  Cardioplegia cannula was removed from the ascending aorta.  The proximal vein graft anastomoses were performed to 4.5 mm punch aortotomies with running 6-0 Prolene sutures.  At the completion of the final proximal anastomosis, the patient was placed in Trendelenburg position.  Lidocaine was administered.  The aortic root was de-aired and the aortic crossclamp was removed.  The total crossclamp time was 75 minutes.  The patient spontaneously resumed a bradycardic rhythm.  While rewarming was completed, all proximal and distal anastomoses were inspected for hemostasis.  Epicardial pacing wires were placed on the right ventricle and right atrium and DDD pacing was initiated and when the patient had rewarmed to a core temperature of 37 degrees Celsius, she was weaned from cardiopulmonary bypass on the first attempt without difficulty.  The total bypass time was 107 minutes.  Post-bypass, transesophageal echocardiography was unchanged from the prebypass study.  A test dose of protamine was administered and was well tolerated.  The atrial and aortic cannulae were removed.  The remainder of the protamine was administered without incident.  The chest was irrigated with warm saline.  Hemostasis was achieved.  Left pleural and mediastinal chest tubes were placed through separate subcostal incisions.  The sternum was closed with a combination of single and double heavy gauge stainless steel wires.  The patient tolerated sternal closure without any hemodynamic changes.  The pectoralis fascia, subcutaneous tissue and skin were  closed in standard fashion.  All sponge, needle and instrument counts were correct at the end of the procedure.  The patient was taken from the operating room to the surgical intensive care unit, intubated and in good condition.   MUK D: 12/13/2022 6:04:04 pm T: 12/13/2022 10:35:00 pm  JOB: [id number]/ [id number]

## 2022-12-13 NOTE — Hospital Course (Addendum)
    History of Present Illness:     Mrs. Christine [name] is a 89 year old female with past history of  known coronary artery disease with a history of angina, carotid artery stenosis, dyslipidemia, hypertension, and acquired hypothyroidism.  She had an abnormal coronary CTA in 2021 and has been followed by her cardiologist regularly since then.  She was last seen by Eula Listen, PA-C about 3 weeks ago after having an episode of chest pressure while at church a few days before.  EKG and high-sensitivity troponin were normal at that visit.  Follow-up coronary CTA was ordered showing elevated calcium score of 2080 and evidence of significant coronary stenoses in the proximal to mid LAD and proximal RCA.  Given these findings, left heart catheterization was recommended and was done electively earlier today confirming advanced three-vessel coronary artery disease.  An echocardiogram obtained about 1 year ago showed normal biventricular function.  There was evidence of aortic sclerosis without aortic valve stenosis.  There was mild mitral insufficiency.   We have been asked to evaluate Christine [name] for possible coronary bypass grafting for multivessel coronary artery disease presenting stable angina pectoris.   Christine [name] recently relocated from [place] to [facility]. She remains quite active and currently assists in caring for a special needs grandson and also for her daughter who recently sustained orthopedic injuries.  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  She is currently comfortable, free of any chest pain or shortness of breath.  Following review of the patient and all relevant studies Dr. Melissaann Fetch recommended proceeding with CABG.  Hospital course: 910 04/2022 the patient was taken the operating room at which time she underwent CABG x 4.  A LIMA-LAD, SVG-distal right, sequential SVG-OM1-OM2 replaced.  She tolerated the procedure well  and was taken to the surgical intensive care unit in stable condition.  Postoperative hospital course:  Patient has done very well.  Se has remained neurologically intact.  She was weaned from the ventilator using standard post cardiac surgical protocols without difficulty.  She is maintaining sinus rhythm and is hemodynamically stable off all pressors.  She has been placed on aspirin, statin and beta-blocker with plans to resume ARB when blood pressure allows.  Required some diuresis for expected postoperative volume overload.  She has a mild expected acute blood loss anemia which is stable.  She has not required transfusion.  She developed Atrial Fibrillation and was treated with IV Amiodarone bolus and drip protocol.  She converted to NSR.  She was transitioned to oral Amiodarone.  She was felt stable for transfer to the progressive care unit on 12/16/2022.  The patient was constipated and was treated with Lactulose.  The patient was evaluated by PT/OT whom recommended skilled nursing.  Social work is assisting with disposition planning and placement.  She is continue to make slow but steady progress in her recovery.  Incisions are healing well without evidence of infection.  Oxygen has been weaned and she maintains good saturations on room air.

## 2022-12-13 NOTE — Progress Notes (Signed)
Patient ID: Christine [name], female   DOB: 03-28-1933, 89 y.o.   MRN: [id number]  TCTS Evening Rounds:   Hemodynamically stable  CI = 2.0  Extubated  Urine output good  CT output low  CBC    Component Value Date/Time   WBC 11.8 (H) 12/13/2022 1335   RBC 3.41 (L) 12/13/2022 1335   HGB 9.2 (L) 12/13/2022 1400   HGB 15.4 12/06/2022 1151   HCT 27.0 (L) 12/13/2022 1400   HCT 46.0 12/06/2022 1151   PLT 119 (L) 12/13/2022 1335   PLT 203 12/06/2022 1151   MCV 88.0 12/13/2022 1335   MCV 90 12/06/2022 1151   MCH 29.6 12/13/2022 1335   MCHC 33.7 12/13/2022 1335   RDW 12.4 12/13/2022 1335   RDW 12.5 12/06/2022 1151   LYMPHSABS 1.8 09/06/2021 1214   MONOABS 0.5 09/06/2021 1214   EOSABS 0.2 09/06/2021 1214   BASOSABS 0.0 09/06/2021 1214     BMET    Component Value Date/Time   NA 137 12/13/2022 1400   NA 138 12/06/2022 1151   K 3.9 12/13/2022 1400   CL 99 12/13/2022 1254   CO2 25 12/13/2022 0426   GLUCOSE 135 (H) 12/13/2022 1254   BUN 8 12/13/2022 1254   BUN 12 12/06/2022 1151   CREATININE 0.60 12/13/2022 1254   CALCIUM 9.0 12/13/2022 0426   EGFR 61 12/06/2022 1151   GFRNONAA >60 12/13/2022 0426     A/P:  Stable postop course. Continue current plans

## 2022-12-14 ENCOUNTER — Inpatient Hospital Stay (HOSPITAL_COMMUNITY): Payer: Medicare Other

## 2022-12-14 ENCOUNTER — Encounter (HOSPITAL_COMMUNITY): Payer: Self-pay | Admitting: Thoracic Surgery (Cardiothoracic Vascular Surgery)

## 2022-12-14 LAB — BASIC METABOLIC PANEL
Anion gap: 8 (ref 5–15)
Anion gap: 8 (ref 5–15)
BUN: 9 mg/dL (ref 8–23)
BUN: 9 mg/dL (ref 8–23)
CO2: 24 mmol/L (ref 22–32)
CO2: 26 mmol/L (ref 22–32)
Calcium: 8 mg/dL — ABNORMAL LOW (ref 8.9–10.3)
Calcium: 8.3 mg/dL — ABNORMAL LOW (ref 8.9–10.3)
Chloride: 101 mmol/L (ref 98–111)
Chloride: 96 mmol/L — ABNORMAL LOW (ref 98–111)
Creatinine, Ser: 0.82 mg/dL (ref 0.44–1.00)
Creatinine, Ser: 0.87 mg/dL (ref 0.44–1.00)
GFR, Estimated: >60 mL/min (ref >60)
GFR, Estimated: >60 mL/min (ref >60)
Glucose, Bld: 134 mg/dL — ABNORMAL HIGH (ref 70–99)
Glucose, Bld: 137 mg/dL — ABNORMAL HIGH (ref 70–99)
Potassium: 3.8 mmol/L (ref 3.5–5.1)
Potassium: 3.4 mmol/L — ABNORMAL LOW (ref 3.5–5.1)
Sodium: 133 mmol/L — ABNORMAL LOW (ref 135–145)
Sodium: 130 mmol/L — ABNORMAL LOW (ref 135–145)

## 2022-12-14 LAB — GLUCOSE, CAPILLARY
Glucose-Capillary: 101 mg/dL — ABNORMAL HIGH (ref 70–99)
Glucose-Capillary: 126 mg/dL — ABNORMAL HIGH (ref 70–99)
Glucose-Capillary: 130 mg/dL — ABNORMAL HIGH (ref 70–99)
Glucose-Capillary: 131 mg/dL — ABNORMAL HIGH (ref 70–99)
Glucose-Capillary: 131 mg/dL — ABNORMAL HIGH (ref 70–99)
Glucose-Capillary: 133 mg/dL — ABNORMAL HIGH (ref 70–99)
Glucose-Capillary: 135 mg/dL — ABNORMAL HIGH (ref 70–99)
Glucose-Capillary: 135 mg/dL — ABNORMAL HIGH (ref 70–99)
Glucose-Capillary: 136 mg/dL — ABNORMAL HIGH (ref 70–99)
Glucose-Capillary: 138 mg/dL — ABNORMAL HIGH (ref 70–99)
Glucose-Capillary: 141 mg/dL — ABNORMAL HIGH (ref 70–99)
Glucose-Capillary: 145 mg/dL — ABNORMAL HIGH (ref 70–99)
Glucose-Capillary: 147 mg/dL — ABNORMAL HIGH (ref 70–99)
Glucose-Capillary: 155 mg/dL — ABNORMAL HIGH (ref 70–99)
Glucose-Capillary: 165 mg/dL — ABNORMAL HIGH (ref 70–99)
Glucose-Capillary: 170 mg/dL — ABNORMAL HIGH (ref 70–99)

## 2022-12-14 LAB — MAGNESIUM
Magnesium: 2.1 mg/dL (ref 1.7–2.4)
Magnesium: 1.8 mg/dL (ref 1.7–2.4)

## 2022-12-14 LAB — CBC
HCT: 28.8 % — ABNORMAL LOW (ref 36.0–46.0)
HCT: 27.6 % — ABNORMAL LOW (ref 36.0–46.0)
Hemoglobin: 9.8 g/dL — ABNORMAL LOW (ref 12.0–15.0)
Hemoglobin: 9.1 g/dL — ABNORMAL LOW (ref 12.0–15.0)
MCH: 30.6 pg (ref 26.0–34.0)
MCH: 30.2 pg (ref 26.0–34.0)
MCHC: 34 g/dL (ref 30.0–36.0)
MCHC: 33 g/dL (ref 30.0–36.0)
MCV: 90 fL (ref 80.0–100.0)
MCV: 91.7 fL (ref 80.0–100.0)
Platelets: 133 10*3/uL — ABNORMAL LOW (ref 150–400)
Platelets: 102 10*3/uL — ABNORMAL LOW (ref 150–400)
RBC: 3.2 MIL/uL — ABNORMAL LOW (ref 3.87–5.11)
RBC: 3.01 MIL/uL — ABNORMAL LOW (ref 3.87–5.11)
RDW: 12.8 % (ref 11.5–15.5)
RDW: 12.9 % (ref 11.5–15.5)
WBC: 12.1 10*3/uL — ABNORMAL HIGH (ref 4.0–10.5)
WBC: 10.7 10*3/uL — ABNORMAL HIGH (ref 4.0–10.5)
nRBC: 0 % (ref 0.0–0.2)
nRBC: 0 % (ref 0.0–0.2)

## 2022-12-14 MED ORDER — LORATADINE 10 MG PO TABS: 10.0000 mg | ORAL_TABLET | Freq: Every day | ORAL | Status: DC | PRN

## 2022-12-14 MED ORDER — FUROSEMIDE 10 MG/ML IJ SOLN
20.0000 mg | Freq: Two times a day (BID) | INTRAMUSCULAR | Status: DC
  Administered 2022-12-14 (×2): 20 mg via INTRAVENOUS
  Filled 2022-12-14 (×2): qty 2

## 2022-12-14 MED ORDER — POTASSIUM CHLORIDE CRYS ER 20 MEQ PO TBCR
20.0000 meq | EXTENDED_RELEASE_TABLET | ORAL | Status: AC
  Administered 2022-12-14 – 2022-12-15 (×3): 20 meq via ORAL
  Filled 2022-12-14 (×3): qty 1

## 2022-12-14 MED ORDER — FLUTICASONE FUROATE-VILANTEROL 200-25 MCG/ACT IN AEPB: 1.0000 | INHALATION_SPRAY | Freq: Every day | RESPIRATORY_TRACT | Status: DC | PRN

## 2022-12-14 MED ORDER — ALBUMIN HUMAN 5 % IV SOLN
12.5000 g | Freq: Once | INTRAVENOUS | Status: AC
  Administered 2022-12-14: 12.5 g via INTRAVENOUS
  Filled 2022-12-14: qty 250

## 2022-12-14 MED ORDER — ENOXAPARIN SODIUM 40 MG/0.4ML IJ SOSY
40.0000 mg | PREFILLED_SYRINGE | Freq: Every day | INTRAMUSCULAR | Status: DC
  Administered 2022-12-14 – 2022-12-20 (×7): 40 mg via SUBCUTANEOUS
  Filled 2022-12-14 (×7): qty 0.4

## 2022-12-14 MED ORDER — INSULIN ASPART 100 UNIT/ML IJ SOLN
0.0000 [IU] | INTRAMUSCULAR | Status: DC
  Administered 2022-12-14 (×3): 2 [IU] via SUBCUTANEOUS

## 2022-12-14 MED ORDER — POTASSIUM CHLORIDE 10 MEQ/50ML IV SOLN
10.0000 meq | INTRAVENOUS | Status: AC
  Administered 2022-12-14 (×3): 10 meq via INTRAVENOUS
  Filled 2022-12-14 (×3): qty 50

## 2022-12-14 MED ORDER — ALBUTEROL SULFATE (2.5 MG/3ML) 0.083% IN NEBU
2.5000 mg | INHALATION_SOLUTION | Freq: Four times a day (QID) | RESPIRATORY_TRACT | Status: DC | PRN
  Administered 2022-12-16 – 2022-12-18 (×4): 2.5 mg via RESPIRATORY_TRACT
  Filled 2022-12-14 (×4): qty 3

## 2022-12-14 MED ORDER — INSULIN DETEMIR 100 UNIT/ML ~~LOC~~ SOLN
15.0000 [IU] | Freq: Two times a day (BID) | SUBCUTANEOUS | Status: DC
  Administered 2022-12-14 (×2): 15 [IU] via SUBCUTANEOUS
  Filled 2022-12-14 (×7): qty 0.15

## 2022-12-14 MED FILL — Heparin Sodium (Porcine) Inj 1000 Unit/ML: Qty: 1000 | Status: AC

## 2022-12-14 MED FILL — Tranexamic Acid IV Soln 1000 MG/10ML (100 MG/ML): INTRAVENOUS | Qty: 3000 | Status: AC

## 2022-12-14 MED FILL — Potassium Chloride Inj 2 mEq/ML: INTRAVENOUS | Qty: 40 | Status: AC

## 2022-12-14 MED FILL — Magnesium Sulfate Inj 50%: INTRAMUSCULAR | Qty: 10 | Status: AC

## 2022-12-14 NOTE — Progress Notes (Signed)
1 Day Post-Op Procedure(s) (LRB): CORONARY ARTERY BYPASS GRAFTING (CABG) TIMES FOUR UTILIZING LEFT INTERNAL MAMMARY ARTERY AND ENDOSCOPIC VEIN HARVEST RIGHT GREATER SAPHENOUS VEIN (N/A) TRANSESOPHAGEAL ECHOCARDIOGRAM (N/A) Subjective: No complaints, denies pain and nausea  Objective: Vital signs in last 24 hours: Temp:  [96.3 F (35.7 C)-99.5 F (37.5 C)] 99.1 F (37.3 C) (10/03 0700) Pulse Rate:  [88-98] 89 (10/03 0700) Cardiac Rhythm: Atrial paced (10/03 0400) Resp:  [12-29] 29 (10/03 0700) BP: (83-129)/(48-75) 129/57 (10/03 0700) SpO2:  [93 %-100 %] 94 % (10/03 0700) Arterial Line BP: (94-139)/(41-66) 124/52 (10/03 0700) FiO2 (%):  [40 %-50 %] 40 % (10/02 1731) Weight:  [85.4 kg] 85.4 kg (10/03 0600)  Hemodynamic parameters for last 24 hours: PAP: (19-38)/(7-20) 26/15 CO:  [3.1 L/min-5.8 L/min] 4.9 L/min CI:  [1.7 L/min/m2-3.1 L/min/m2] 2.7 L/min/m2  Intake/Output from previous day: 10/02 0701 - 10/03 0700 In: 11085.8 [P.O.:50; I.V.:3539.9; Blood:300; IV Piggyback:7195.9] Out: 4396 [Urine:3320; Blood:514; Chest Tube:562] Intake/Output this shift: No intake/output data recorded.  General appearance: alert, cooperative, and no distress Neurologic: intact Heart: regular rate and rhythm Lungs: diminished breath sounds bibasilar Abdomen: normal findings: soft, non-tender  Lab Results: Recent Labs    12/13/22 2003 12/14/22 0429  WBC 8.6 12.1*  HGB 9.5* 9.8*  HCT 27.6* 28.8*  PLT 119* 133*   BMET:  Recent Labs    12/13/22 2003 12/14/22 0429  NA 137 133*  K 4.1 3.8  CL 104 101  CO2 26 24  GLUCOSE 148* 134*  BUN 9 9  CREATININE 0.72 0.82  CALCIUM 7.7* 8.0*    PT/INR:  Recent Labs    12/13/22 1335  LABPROT 17.6*  INR 1.4*   ABG    Component Value Date/Time   PHART 7.332 (L) 12/13/2022 1905   HCO3 23.2 12/13/2022 1905   TCO2 25 12/13/2022 1905   ACIDBASEDEF 3.0 (H) 12/13/2022 1905   O2SAT 94 12/13/2022 1905   CBG (last 3)  Recent Labs     12/14/22 0301 12/14/22 0415 12/14/22 0643  GLUCAP 131* 126* 155*    Assessment/Plan: S/P Procedure(s) (LRB): CORONARY ARTERY BYPASS GRAFTING (CABG) TIMES FOUR UTILIZING LEFT INTERNAL MAMMARY ARTERY AND ENDOSCOPIC VEIN HARVEST RIGHT GREATER SAPHENOUS VEIN (N/A) TRANSESOPHAGEAL ECHOCARDIOGRAM (N/A) POD # 1, looks great NEURO- intact CV- in SR in 60s- A paced   Still on Neo-synephrine- wean as BP allows  ASA, beta blocker, statin RESP- IS for atelectasis RENAL- creatinine normal   Weight up 15 pounds- diurese as BP allows ENDO- CBG controlled with insulin drip   Transition to Levemir + SSI Gi advance diet as tolerated Anemia secondary to ABL- mild, monitor SCD + enoxaparin for DVT prophylaxis Dc chest tubes Cardiac rehab   LOS: 3 days    Christine [name] 12/14/2022

## 2022-12-14 NOTE — Progress Notes (Signed)
      [address]       [place] [address]             [phone]      POD # 1 CABG  No complaints  BP (!) 117/55   Pulse 90   Temp 97.8 F (36.6 C) (Oral)   Resp (!) 21   Ht 5\' 5"  (1.651 m)   Wt 85.4 kg   SpO2 97%   BMI 31.33 kg/m    Intake/Output Summary (Last 24 hours) at 12/14/2022 1747 Last data filed at 12/14/2022 1700 Gross per 24 hour  Intake 7202.6 ml  Output 2346 ml  Net 4856.6 ml   K 3.4, creatinine 0.87- supplement K Hct 27, PLT 102K  CBG mildly elevated- continue Levemir + SSI  Christine [name] C. Evangaline Fetch, MD [facility] [phone]

## 2022-12-14 NOTE — Progress Notes (Signed)
TCTS DAILY ICU PROGRESS NOTE                   [address]            [place] [address]          [phone]   1 Day Post-Op Procedure(s) (LRB): CORONARY ARTERY BYPASS GRAFTING (CABG) TIMES FOUR UTILIZING LEFT INTERNAL MAMMARY ARTERY AND ENDOSCOPIC VEIN HARVEST RIGHT GREATER SAPHENOUS VEIN (N/A) TRANSESOPHAGEAL ECHOCARDIOGRAM (N/A)  Total Length of Stay:  LOS: 3 days   Subjective: Feeling pretty well, minor discomforts  Objective: Vital signs in last 24 hours: Temp:  [96.3 F (35.7 C)-99.5 F (37.5 C)] 99.1 F (37.3 C) (10/03 0700) Pulse Rate:  [88-98] 89 (10/03 0700) Cardiac Rhythm: Atrial paced (10/03 0400) Resp:  [12-29] 29 (10/03 0700) BP: (83-129)/(48-75) 129/57 (10/03 0700) SpO2:  [93 %-100 %] 94 % (10/03 0700) Arterial Line BP: (94-139)/(41-66) 124/52 (10/03 0700) FiO2 (%):  [40 %-50 %] 40 % (10/02 1731) Weight:  [85.4 kg] 85.4 kg (10/03 0600)  Filed Weights   12/11/22 0806 12/13/22 0728 12/14/22 0600  Weight: 78.5 kg 78.5 kg 85.4 kg    Weight change:    Hemodynamic parameters for last 24 hours: PAP: (19-38)/(7-20) 26/15 CO:  [3.1 L/min-5.8 L/min] 4.9 L/min CI:  [1.7 L/min/m2-3.1 L/min/m2] 2.7 L/min/m2  Intake/Output from previous day: 10/02 0701 - 10/03 0700 In: 11085.8 [P.O.:50; I.V.:3539.9; Blood:300; IV Piggyback:7195.9] Out: 4396 [Urine:3320; Blood:514; Chest Tube:562]  Intake/Output this shift: No intake/output data recorded.  Current Meds: Scheduled Meds:  acetaminophen  1,000 mg Oral Q6H   Or   acetaminophen (TYLENOL) oral liquid 160 mg/5 mL  1,000 mg Per Tube Q6H   aspirin EC  325 mg Oral Daily   Or   aspirin  324 mg Per Tube Daily   bisacodyl  10 mg Oral Daily   Or   bisacodyl  10 mg Rectal Daily   Chlorhexidine Gluconate Cloth  6 each Topical Daily   docusate sodium  200 mg Oral Daily   metoCLOPramide (REGLAN) injection  10 mg Intravenous Q6H   metoprolol tartrate  12.5 mg Oral BID   Or   metoprolol tartrate   12.5 mg Per Tube BID   [START ON 12/15/2022] pantoprazole  40 mg Oral Daily   pantoprazole (PROTONIX) IV  40 mg Intravenous QHS   sodium chloride flush  3 mL Intravenous Q12H   Continuous Infusions:  sodium chloride Stopped (12/13/22 2028)   sodium chloride 1 mL/hr at 12/14/22 0700   sodium chloride 20 mL/hr at 12/14/22 0700   albumin human Stopped (12/13/22 1901)    ceFAZolin (ANCEF) IV Stopped (12/14/22 0272)   dexmedetomidine (PRECEDEX) IV infusion Stopped (12/13/22 1700)   insulin 1.4 Units/hr (12/14/22 0700)   lactated ringers Stopped (12/13/22 1543)   lactated ringers 20 mL/hr at 12/14/22 0700   nitroGLYCERIN Stopped (12/13/22 1544)   phenylephrine (NEO-SYNEPHRINE) Adult infusion 60 mcg/min (12/14/22 0700)   PRN Meds:.sodium chloride, albumin human, dextrose, metoprolol tartrate, midazolam, morphine injection, ondansetron (ZOFRAN) IV, mouth rinse, oxyCODONE, sodium chloride flush, traMADol  General appearance: alert, cooperative, and no distress Heart: regular rate and rhythm Lungs: dim in lower fields Abdomen: soft, non tender Extremities: some edema in hands Wound: dressings CDI  Lab Results: CBC: Recent Labs    12/13/22 2003 12/14/22 0429  WBC 8.6 12.1*  HGB 9.5* 9.8*  HCT 27.6* 28.8*  PLT 119* 133*   BMET:  Recent Labs    12/13/22 2003 12/14/22  0429  NA 137 133*  K 4.1 3.8  CL 104 101  CO2 26 24  GLUCOSE 148* 134*  BUN 9 9  CREATININE 0.72 0.82  CALCIUM 7.7* 8.0*    CMET: Lab Results  Component Value Date   WBC 12.1 (H) 12/14/2022   HGB 9.8 (L) 12/14/2022   HCT 28.8 (L) 12/14/2022   PLT 133 (L) 12/14/2022   GLUCOSE 134 (H) 12/14/2022   CHOL 152 11/14/2022   TRIG 174 (H) 11/14/2022   HDL 60 11/14/2022   LDLDIRECT 116.0 02/12/2020   LDLCALC 57 11/14/2022   ALT 17 12/13/2022   AST 19 12/13/2022   NA 133 (L) 12/14/2022   K 3.8 12/14/2022   CL 101 12/14/2022   CREATININE 0.82 12/14/2022   BUN 9 12/14/2022   CO2 24 12/14/2022   TSH 1.26  09/06/2021   INR 1.4 (H) 12/13/2022   HGBA1C 5.7 (H) 12/13/2022      PT/INR:  Recent Labs    12/13/22 1335  LABPROT 17.6*  INR 1.4*   Radiology: [facility] Chest Port 1 View  Result Date: 12/13/2022 CLINICAL DATA:  Status post coronary artery bypass graft. EXAM: PORTABLE CHEST 1 VIEW COMPARISON:  December 12, 2022. FINDINGS: Stable cardiomediastinal silhouette. Endotracheal and nasogastric tubes are in grossly good position. Right internal jugular Swan-Ganz catheter is noted with tip directed into right pulmonary artery. Left-sided chest tube is noted without pneumothorax. Minimal bibasilar subsegmental atelectasis. IMPRESSION: Endotracheal and nasogastric tubes in grossly good position. Left-sided chest tube without pneumothorax. Minimal bibasilar subsegmental atelectasis. Electronically Signed   By: Lupita Raider M.D.   On: 12/13/2022 14:32   EP STUDY  Result Date: 12/13/2022 See surgical note for result.    Assessment/Plan: S/P Procedure(s) (LRB): CORONARY ARTERY BYPASS GRAFTING (CABG) TIMES FOUR UTILIZING LEFT INTERNAL MAMMARY ARTERY AND ENDOSCOPIC VEIN HARVEST RIGHT GREATER SAPHENOUS VEIN (N/A) TRANSESOPHAGEAL ECHOCARDIOGRAM (N/A) POD#1  1 afeb, apaced, s BP 80's-120's, good CO/CI/PAP, gtts- neo, likely d/c aline and swan soon 2 O2 sats good on 2 liters 3 excellent spont diuresis weight up about 7 kg so will need some diuresis but doing well so far, minor hyponatremia, normal renal fxn 4 chest tube 560 ml- leave for now 5 BS well controlled- standard protocol for non- diabetic 6 minor reactive leukocytosis- monitor 7 expected ABLA, monitor, not at transfusion threshold 8 minor reactive thrombocytopenia- monitor 9 CXR L>R atx/asd, small effus 10 routine pulm hygiene and rehab progression    Rowe Clack PA-C Pager [phone] 12/14/2022 7:37 AM

## 2022-12-15 ENCOUNTER — Inpatient Hospital Stay (HOSPITAL_COMMUNITY): Payer: Medicare Other

## 2022-12-15 LAB — BASIC METABOLIC PANEL
Anion gap: 6 (ref 5–15)
BUN: 9 mg/dL (ref 8–23)
CO2: 27 mmol/L (ref 22–32)
Calcium: 8.3 mg/dL — ABNORMAL LOW (ref 8.9–10.3)
Chloride: 96 mmol/L — ABNORMAL LOW (ref 98–111)
Creatinine, Ser: 0.82 mg/dL (ref 0.44–1.00)
GFR, Estimated: >60 mL/min (ref >60)
Glucose, Bld: 109 mg/dL — ABNORMAL HIGH (ref 70–99)
Potassium: 4.2 mmol/L (ref 3.5–5.1)
Sodium: 129 mmol/L — ABNORMAL LOW (ref 135–145)

## 2022-12-15 LAB — CBC
HCT: 26.1 % — ABNORMAL LOW (ref 36.0–46.0)
Hemoglobin: 8.6 g/dL — ABNORMAL LOW (ref 12.0–15.0)
MCH: 30.5 pg (ref 26.0–34.0)
MCHC: 33 g/dL (ref 30.0–36.0)
MCV: 92.6 fL (ref 80.0–100.0)
Platelets: 101 10*3/uL — ABNORMAL LOW (ref 150–400)
RBC: 2.82 MIL/uL — ABNORMAL LOW (ref 3.87–5.11)
RDW: 13 % (ref 11.5–15.5)
WBC: 10.5 10*3/uL (ref 4.0–10.5)
nRBC: 0 % (ref 0.0–0.2)

## 2022-12-15 LAB — GLUCOSE, CAPILLARY
Glucose-Capillary: 112 mg/dL — ABNORMAL HIGH (ref 70–99)
Glucose-Capillary: 123 mg/dL — ABNORMAL HIGH (ref 70–99)
Glucose-Capillary: 134 mg/dL — ABNORMAL HIGH (ref 70–99)
Glucose-Capillary: 107 mg/dL — ABNORMAL HIGH (ref 70–99)
Glucose-Capillary: 148 mg/dL — ABNORMAL HIGH (ref 70–99)

## 2022-12-15 MED ORDER — ~~LOC~~ CARDIAC SURGERY, PATIENT & FAMILY EDUCATION: Freq: Once | Status: DC

## 2022-12-15 MED ORDER — MAGNESIUM HYDROXIDE 400 MG/5ML PO SUSP: 30.0000 mL | Freq: Every day | ORAL | Status: DC | PRN

## 2022-12-15 MED ORDER — INSULIN ASPART 100 UNIT/ML IJ SOLN
0.0000 [IU] | Freq: Three times a day (TID) | INTRAMUSCULAR | Status: DC
  Administered 2022-12-15 – 2022-12-16 (×2): 2 [IU] via SUBCUTANEOUS

## 2022-12-15 MED ORDER — SODIUM CHLORIDE 0.9% FLUSH: 3.0000 mL | INTRAVENOUS | Status: DC | PRN

## 2022-12-15 MED ORDER — SODIUM CHLORIDE 0.9 % IV SOLN: 250.0000 mL | INTRAVENOUS | Status: DC | PRN

## 2022-12-15 MED ORDER — SODIUM CHLORIDE 0.9% FLUSH
3.0000 mL | Freq: Two times a day (BID) | INTRAVENOUS | Status: DC
  Administered 2022-12-15 – 2022-12-19 (×7): 3 mL via INTRAVENOUS

## 2022-12-15 MED ORDER — AMIODARONE HCL IN DEXTROSE 360-4.14 MG/200ML-% IV SOLN
30.0000 mg/h | INTRAVENOUS | Status: DC
  Administered 2022-12-16: 30 mg/h via INTRAVENOUS
  Filled 2022-12-15 (×2): qty 200

## 2022-12-15 MED ORDER — AMIODARONE LOAD VIA INFUSION
150.0000 mg | Freq: Once | INTRAVENOUS | Status: AC
  Administered 2022-12-15: 150 mg via INTRAVENOUS
  Filled 2022-12-15: qty 83.34

## 2022-12-15 MED ORDER — ALUM & MAG HYDROXIDE-SIMETH 200-200-20 MG/5ML PO SUSP
15.0000 mL | Freq: Four times a day (QID) | ORAL | Status: DC | PRN
  Administered 2022-12-15 – 2022-12-20 (×7): 15 mL via ORAL
  Filled 2022-12-15 (×7): qty 30

## 2022-12-15 MED ORDER — FUROSEMIDE 40 MG PO TABS
40.0000 mg | ORAL_TABLET | Freq: Every day | ORAL | Status: DC
  Administered 2022-12-15 – 2022-12-19 (×5): 40 mg via ORAL
  Filled 2022-12-15 (×5): qty 1

## 2022-12-15 MED ORDER — AMIODARONE HCL IN DEXTROSE 360-4.14 MG/200ML-% IV SOLN
60.0000 mg/h | INTRAVENOUS | Status: AC
  Administered 2022-12-15 (×3): 60 mg/h via INTRAVENOUS
  Filled 2022-12-15 (×2): qty 200

## 2022-12-15 MED ORDER — POTASSIUM CHLORIDE CRYS ER 20 MEQ PO TBCR
20.0000 meq | EXTENDED_RELEASE_TABLET | Freq: Every day | ORAL | Status: DC
  Administered 2022-12-15 – 2022-12-19 (×5): 20 meq via ORAL
  Filled 2022-12-15: qty 2
  Filled 2022-12-15 (×5): qty 1

## 2022-12-15 NOTE — Progress Notes (Signed)
2 Days Post-Op Procedure(s) (LRB): CORONARY ARTERY BYPASS GRAFTING (CABG) TIMES FOUR UTILIZING LEFT INTERNAL MAMMARY ARTERY AND ENDOSCOPIC VEIN HARVEST RIGHT GREATER SAPHENOUS VEIN (N/A) TRANSESOPHAGEAL ECHOCARDIOGRAM (N/A) Subjective: No complaints this morning  Objective: Vital signs in last 24 hours: Temp:  [97.8 F (36.6 C)-99 F (37.2 C)] 98 F (36.7 C) (10/03 2300) Pulse Rate:  [69-92] 80 (10/04 0700) Cardiac Rhythm: Atrial paced (10/03 2029) Resp:  [14-32] 22 (10/04 0700) BP: (88-149)/(44-98) 129/59 (10/04 0700) SpO2:  [93 %-99 %] 93 % (10/04 0700) Arterial Line BP: (99-132)/(47-53) 132/53 (10/03 1000) Weight:  [86.9 kg] 86.9 kg (10/04 0630)  Hemodynamic parameters for last 24 hours: PAP: (34)/(24) 34/24  Intake/Output from previous day: 10/03 0701 - 10/04 0700 In: 1297.9 [P.O.:240; I.V.:357.6; IV Piggyback:700.2] Out: 1665 [Urine:1615; Chest Tube:50] Intake/Output this shift: No intake/output data recorded.  General appearance: alert, cooperative, and no distress Neurologic: intact Heart: regular rate and rhythm Lungs: diminished breath sounds bibasilar Abdomen: normal findings: soft, non-tender  Lab Results: Recent Labs    12/14/22 1651 12/15/22 0412  WBC 10.7* 10.5  HGB 9.1* 8.6*  HCT 27.6* 26.1*  PLT 102* 101*   BMET:  Recent Labs    12/14/22 1651 12/15/22 0412  NA 130* 129*  K 3.4* 4.2  CL 96* 96*  CO2 26 27  GLUCOSE 137* 109*  BUN 9 9  CREATININE 0.87 0.82  CALCIUM 8.3* 8.3*    PT/INR:  Recent Labs    12/13/22 1335  LABPROT 17.6*  INR 1.4*   ABG    Component Value Date/Time   PHART 7.332 (L) 12/13/2022 1905   HCO3 23.2 12/13/2022 1905   TCO2 25 12/13/2022 1905   ACIDBASEDEF 3.0 (H) 12/13/2022 1905   O2SAT 94 12/13/2022 1905   CBG (last 3)  Recent Labs    12/14/22 2036 12/14/22 2333 12/15/22 0410  GLUCAP 130* 131* 112*    Assessment/Plan: S/P Procedure(s) (LRB): CORONARY ARTERY BYPASS GRAFTING (CABG) TIMES FOUR  UTILIZING LEFT INTERNAL MAMMARY ARTERY AND ENDOSCOPIC VEIN HARVEST RIGHT GREATER SAPHENOUS VEIN (N/A) TRANSESOPHAGEAL ECHOCARDIOGRAM (N/A) POD # 2 Doing well NEURO- intact CV- in SR, BP relatively low  Continue ASA, statin, beta blocker  Resume ARB when BP allows RESP- IS for atelectasis RENAL- creatinine normal  PO Lasix/ K ENDO- CBG well controlled  Change SSi to AC/ hS, dc Levemir GI- regular doet Dc Foley and central line SCD + enoxaparin Continue cardiac rehab Trnansfer to 4E   LOS: 4 days    Loreli Slot 12/15/2022

## 2022-12-15 NOTE — Discharge Summary (Signed)
[address]       [place] [address]             [phone]    Physician Discharge Summary  Patient ID: Christine [name] MRN: [id number] DOB/AGE: [birth year] 89 y.o.  Admit date: 12/11/2022 Discharge date: 12/15/2022  Admission Diagnoses:  Patient Active Problem List   Diagnosis Date Noted   S/P CABG x 4 12/13/2022   Coronary artery disease involving native coronary artery of native heart with unstable angina pectoris (HCC) 12/11/2022   Frequent headaches 10/20/2022   Eustachian tube disorder, right 10/20/2022   Anxiety and depression 08/15/2022   Neck pain 05/12/2022   Sensation of fullness in left ear 05/12/2022   Impacted cerumen of left ear 05/12/2022   Chronic cough 04/06/2022   Other headache syndrome 04/06/2022   Bronchitis 04/06/2022   Thoracic spine pain 03/20/2022   Muscle tightness 03/20/2022   Acute post-traumatic headache, not intractable 01/06/2022   Rib pain on left side 12/26/2021   Cellulitis of left leg 12/13/2021   Hypokalemia 12/13/2021   Closed fracture of body of sternum 11/30/2021   Lightheadedness 11/30/2021   Daytime sleepiness 09/27/2021   Coronary artery disease of native artery of native heart with stable angina pectoris (HCC) 05/12/2020   Chest pain 04/02/2020   Current moderate episode of major depressive disorder without prior episode (HCC) 03/02/2020   Fatigue 02/15/2020   Mild intermittent asthma without complication 02/12/2020   DOE (dyspnea on exertion) 02/12/2020   Stenosis of carotid artery 06/12/2019   Osteoarthritis of knees, bilateral 06/03/2019   Chronic pain of left knee 12/10/2017   Essential hypertension 12/10/2017   Acquired hypothyroidism 12/10/2017   GERD without esophagitis 12/10/2017   Hyperlipidemia LDL goal <70 12/10/2017     Discharge Diagnoses:  Patient Active Problem List   Diagnosis Date Noted   S/P CABG x 4 12/13/2022   Coronary artery disease involving native coronary artery of  native heart with unstable angina pectoris (HCC) 12/11/2022   Frequent headaches 10/20/2022   Eustachian tube disorder, right 10/20/2022   Anxiety and depression 08/15/2022   Neck pain 05/12/2022   Sensation of fullness in left ear 05/12/2022   Impacted cerumen of left ear 05/12/2022   Chronic cough 04/06/2022   Other headache syndrome 04/06/2022   Bronchitis 04/06/2022   Thoracic spine pain 03/20/2022   Muscle tightness 03/20/2022   Acute post-traumatic headache, not intractable 01/06/2022   Rib pain on left side 12/26/2021   Cellulitis of left leg 12/13/2021   Hypokalemia 12/13/2021   Closed fracture of body of sternum 11/30/2021   Lightheadedness 11/30/2021   Daytime sleepiness 09/27/2021   Coronary artery disease of native artery of native heart with stable angina pectoris (HCC) 05/12/2020   Chest pain 04/02/2020   Current moderate episode of major depressive disorder without prior episode (HCC) 03/02/2020   Fatigue 02/15/2020   Mild intermittent asthma without complication 02/12/2020   DOE (dyspnea on exertion) 02/12/2020   Stenosis of carotid artery 06/12/2019   Osteoarthritis of knees, bilateral 06/03/2019   Chronic pain of left knee 12/10/2017   Essential hypertension 12/10/2017   Acquired hypothyroidism 12/10/2017   GERD without esophagitis 12/10/2017   Hyperlipidemia LDL goal <70 12/10/2017     Discharged Condition: {condition:18240}     History of Present Illness:     Mrs. Christine [name] is a 89 year old female with past history of  known coronary artery disease with a history of angina, carotid artery stenosis, dyslipidemia, hypertension,  and acquired hypothyroidism.  She had an abnormal coronary CTA in 2021 and has been followed by her cardiologist regularly since then.  She was last seen by Eula Listen, PA-C about 3 weeks ago after having an episode of chest pressure while at church a few days before.  EKG and high-sensitivity troponin were normal at that visit.   Follow-up coronary CTA was ordered showing elevated calcium score of 2080 and evidence of significant coronary stenoses in the proximal to mid LAD and proximal RCA.  Given these findings, left heart catheterization was recommended and was done electively earlier today confirming advanced three-vessel coronary artery disease.  An echocardiogram obtained about 1 year ago showed normal biventricular function.  There was evidence of aortic sclerosis without aortic valve stenosis.  There was mild mitral insufficiency.   We have been asked to evaluate Christine [name] for possible coronary bypass grafting for multivessel coronary artery disease presenting stable angina pectoris.   Christine [name] recently relocated from [place] to [facility]. She remains quite active and currently assists in caring for a special needs grandson and also for her daughter who recently sustained orthopedic injuries.  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  She is currently comfortable, free of any chest pain or shortness of breath.  Following review of the patient and all relevant studies Dr. Milisa Fetch recommended proceeding with CABG.  Hospital course: 910 04/2022 the patient was taken the operating room at which time she underwent CABG x 4.  A LIMA-LAD, SVG-distal right, sequential SVG-OM1-OM2 replaced.  She tolerated the procedure well and was taken to the surgical intensive care unit in stable condition.  Postoperative hospital course:  Patient has done very well.  He has remained neurologically intact.  She was weaned from the ventilator using standard post cardiac surgical protocols without difficulty.  She is maintaining sinus rhythm and is hemodynamically stable off all pressors.  She has been placed on aspirin, statin and beta-blocker with plans to resume ARB when blood pressure allows.  Required some diuresis for expected postoperative volume overload.   She has a mild expected acute blood loss anemia which is stable.  She has not required transfusion.    Consults: {consultation:18241}  Significant Diagnostic Studies: {diagnostics:18242} DG Chest Port 1 View  Result Date: 12/15/2022 CLINICAL DATA:  Status post CABG EXAM: PORTABLE CHEST 1 VIEW COMPARISON:  Chest radiograph dated 12/14/2022 FINDINGS: Lines/tubes: Central venous catheter tip projects over the SVC. Interval removal of PA catheter and pleural catheters. Lungs: Mildly low lung volumes. Mild interstitial opacities. Persistent dense left retrocardiac opacity. Pleura: Similar small left pleural effusion. No definite pneumothorax. Heart/mediastinum: Similar postsurgical cardiomediastinal silhouette. Bones: Median sternotomy wires are nondisplaced. IMPRESSION: 1. Interval removal of PA catheter and pleural catheters. No definite pneumothorax. 2. Similar small left pleural effusion and dense left retrocardiac opacity, likely atelectasis. 3. Mild interstitial opacities, likely edema. Electronically Signed   By: Agustin Cree M.D.   On: 12/15/2022 08:10   CT CORONARY MORPH W/CTA COR W/SCORE W/CA W/CM &/OR WO/CM  Addendum Date: 12/14/2022   ADDENDUM REPORT: 12/14/2022 10:21 EXAM: OVER-READ INTERPRETATION  CT CHEST The following report is an over-read performed by radiologist Dr. Nadara Eaton [facility] on 12/14/2022. This over-read does not include interpretation of cardiac or coronary anatomy or pathology. The coronary CTA interpretation by the cardiologist is attached. COMPARISON:  04/08/2020 FINDINGS: Visualized mediastinal structures are normal. Images of the upper abdomen are unremarkable. Stable  3 mm nodular density in left upper lobe on image 2/11. Stable scarring at the lingula. No airspace disease or consolidation in the visualized lungs. Question tiny nodular density at the right lung base on image 59/11 that is likely stable. No large pleural effusions. No acute bone abnormality.  Bilateral breast implants. IMPRESSION: No acute extracardiac findings. Electronically Signed   By: Richarda Overlie M.D.   On: 12/14/2022 10:21   Result Date: 12/14/2022 CLINICAL DATA:  Chest pain and shortness of breath EXAM: Cardiac/Coronary  CTA TECHNIQUE: The patient was scanned on a Siemens Somatom go.Top scanner. : A retrospective scan was triggered in the ascending thoracic aorta. Axial non-contrast 3 mm slices were carried out through the heart. The data set was analyzed on a dedicated work station and scored using the Agatson method. Gantry rotation speed was 330 msecs and collimation was .6 mm. 50mg  of metoprolol and 0.8 mg of sl NTG was given. The 3D data set was reconstructed in 5% intervals of the 60-95 % of the R-R cycle. Diastolic phases were analyzed on a dedicated work station using MPR, MIP and VRT modes. The patient received 75 cc of contrast. FINDINGS: Aorta:  Normal size.  Aortic wall calcifications.  No dissection. Aortic Valve:  Trileaflet.  No calcifications. Coronary Arteries:  Normal coronary origin.  Right dominance. RCA is a dominant artery. There is calcified plaque proximally causing moderate stenosis (50-69%). Left main gives rise to LAD and LCX arteries. LM has calcified plaque proximally causing mild stenosis (25-49%). LAD has heavily calcified plaque proximally causing severe stenosis (>70%). LCX is a non-dominant artery. There is calcified plaque proximally causing mild stenosis (25-49%). Other findings: Normal pulmonary vein drainage into the left atrium. Normal left atrial appendage without a thrombus. Normal size of the pulmonary artery. IMPRESSION: 1. Coronary calcium score of 2280. 2. Normal coronary origin with right dominance. 3. Severe proximal-mid LAD stenosis (>70%). 4. Moderate proximal RCA stenosis (50-69%). 5. Mild LM and LCx stenosis (25-49%). 6. CAD-RADS 4 Severe stenosis. (70-99% or > 50% left main). Cardiac catheterization is recommended. Consider symptom-guided  anti-ischemic pharmacotherapy as well as risk factor modification per guideline directed care. 7. Additional analysis with CT FFR will be submitted and reported separately. Electronically Signed: By: Debbe Odea M.D. On: 11/30/2022 14:44   DG Chest Port 1 View  Result Date: 12/14/2022 CLINICAL DATA:  Status post CABG. EXAM: PORTABLE CHEST 1 VIEW COMPARISON:  12/13/2022 FINDINGS: Endotracheal tube and nasogastric tube have been removed. Chest drains are still in place. Negative for pneumothorax. Slightly increased interstitial densities in both lungs could represent atelectasis and/or mild edema. Increased densities at the left lung base are suggestive atelectasis and volume loss. Right jugular central line with pulmonary arterial catheter is still present. Pulmonary arterial catheter is in the distal right pulmonary artery region. IMPRESSION: 1. Removal of the endotracheal tube and nasogastric tube. 2. Increased densities at the left lung base are suggestive for atelectasis and volume loss. 3. Slightly increased interstitial densities in both lungs. Findings could represent atelectasis and/or mild edema. Electronically Signed   By: Richarda Overlie M.D.   On: 12/14/2022 10:04   DG Chest Port 1 View  Result Date: 12/13/2022 CLINICAL DATA:  Status post coronary artery bypass graft. EXAM: PORTABLE CHEST 1 VIEW COMPARISON:  December 12, 2022. FINDINGS: Stable cardiomediastinal silhouette. Endotracheal and nasogastric tubes are in grossly good position. Right internal jugular Swan-Ganz catheter is noted with tip directed into right pulmonary artery. Left-sided chest tube is noted without  pneumothorax. Minimal bibasilar subsegmental atelectasis. IMPRESSION: Endotracheal and nasogastric tubes in grossly good position. Left-sided chest tube without pneumothorax. Minimal bibasilar subsegmental atelectasis. Electronically Signed   By: Lupita Raider M.D.   On: 12/13/2022 14:32   ECHO INTRAOPERATIVE TEE  Result  Date: 12/13/2022  *INTRAOPERATIVE TRANSESOPHAGEAL REPORT *  Patient Name:   Christine [name] Date of Exam: 12/13/2022 Medical Rec #:  [id number]         Height:       65.0 in Accession #:    [id number]        Weight:       173.1 lb Date of Birth:  1933/08/03         BSA:          1.86 m Patient Age:    89 years          BP: Patient Gender: F                 HR: Exam Location:  Anesthesiology Transesophogeal exam was perform intraoperatively during surgical procedure. Patient was closely monitored under general anesthesia during the entirety of examination. Performing Phys: Complications: No known complications during this procedure. POST-OP IMPRESSIONS _ Left Ventricle: The left ventricle is unchanged from pre-bypass. There is normal systolic function, with an estimad ejection fraction of 60%. _ Right Ventricle: normal function. _ Aorta: The aorta appears unchanged from pre-bypass. _ Left Atrial Appendage: The left atrial appendage appears unchanged from pre-bypass. _ Aortic Valve: The aortic valve appears unchanged from pre-bypass. _ Mitral Valve: There is mild regurgitation. _ Tricuspid Valve: The tricuspid valve appears unchanged from pre-bypass. _ Pulmonic Valve: The pulmonic valve appears unchanged from pre-bypass. _ Comments:  Following coronary artery bypass grafting, the left ventricular systolic function is normal. Right ventricular function is normal. There was mild mitral regurgitation post-bypass. The remainder of the valvular exam was unchanged. PRE-OP FINDINGS  Left Ventricle: The left ventricle has normal systolic function, with an ejection fraction of 55-60%. The cavity size was normal. No evidence of left ventricular regional wall motion abnormalities. Left ventricular diastolic parameters are consistent with Grade II diastolic dysfunction (pseudonormalization). Right Ventricle: The right ventricle has normal systolic function. The cavity was mildly dialated. Left Atrium: No left atrial/left atrial  appendage thrombus was detected. Interatrial Septum: No atrial level shunt detected by color flow Doppler. Pericardium: There is no evidence of pericardial effusion. There is no pleural effusion. Mitral Valve: The mitral valve is normal in structure. Mitral valve regurgitation is not visualized by color flow Doppler. Tricuspid Valve: The tricuspid valve was normal in structure. Tricuspid valve regurgitation is mild by color flow Doppler. Aortic Valve: The aortic valve is normal in structure. Aortic valve regurgitation was not visualized by color flow Doppler. Pulmonic Valve: The pulmonic valve was normal in structure. Pulmonic valve regurgitation is trivial by color flow Doppler.  Anice Paganini Electronically signed by Anice Paganini Signature Date/Time: 12/13/2022/2:00:05 PM    Final    EP STUDY  Result Date: 12/13/2022 See surgical note for result.  DG Chest 2 View  Result Date: 12/12/2022 CLINICAL DATA:  [id number]. Coronary artery disease with history of myocardial infarction. EXAM: CHEST - 2 VIEW COMPARISON:  Portable chest today at 3:46 p.m. FINDINGS: The heart is upper limits of normal in size. No vascular congestion is seen. The mediastinum is normally outlined, with calcification in the transverse aorta. There is no pleural effusion. The lungs are clear of infiltrates with linear atelectasis or scarring noted in the lingula.  There is osteopenic, degenerative changes and kyphosis of the thoracic spine. There are bilateral heterogeneously rim calcified breast implants, on the left superimposing over the lower lung field more than previously. IMPRESSION: 1. No evidence of acute chest disease. Lingular atelectasis or scarring. 2. Aortic atherosclerosis. 3. Osteopenia, kyphosis and degenerative changes in the thoracic spine. Electronically Signed   By: Almira Bar M.D.   On: 12/12/2022 22:44   DG Chest Port 1 View  Result Date: 12/12/2022 CLINICAL DATA:  Coronary artery disease EXAM: PORTABLE CHEST 1 VIEW  COMPARISON:  08/15/2022 FINDINGS: Cardiac shadow is within normal limits. Aortic calcifications are noted. Lungs are well aerated bilaterally. No focal infiltrate or effusion is seen. Bilateral calcified breast implants are noted. No acute bony abnormality is seen. IMPRESSION: No active disease. Electronically Signed   By: Alcide Clever M.D.   On: 12/12/2022 19:51   VAS US DOPPLER PRE CABG  Result Date: 12/12/2022 PREOPERATIVE VASCULAR EVALUATION Patient Name:  Christine [name]  Date of Exam:   12/12/2022 Medical Rec #: [id number]          Accession #:    [id number] Date of Birth: 1933/10/26          Patient Gender: F Patient Age:   16 years Exam Location:  [facility] Procedure:      VAS US DOPPLER PRE CABG Referring Phys: Viviann Spare HENDRICKSON --------------------------------------------------------------------------------  Indications:      Pre-CABG. Risk Factors:     Hypertension, hyperlipidemia, coronary artery disease. Comparison Study: 06/06/22 - Carotid - right ICA 40-59%, left ICA 1-39% Performing Technologist: Marilynne Halsted RDMS, RVT  Examination Guidelines: A complete evaluation includes B-mode imaging, spectral Doppler, color Doppler, and power Doppler as needed of all accessible portions of each vessel. Bilateral testing is considered an integral part of a complete examination. Limited examinations for reoccurring indications may be performed as noted.  Right Carotid Findings: +----------+--------+--------+--------+------------+--------+           PSV cm/sEDV cm/sStenosisDescribe    Comments +----------+--------+--------+--------+------------+--------+ CCA Prox  84      21                                   +----------+--------+--------+--------+------------+--------+ CCA Distal70      16                                   +----------+--------+--------+--------+------------+--------+ ICA Prox  144     40      40-59%  heterogenous          +----------+--------+--------+--------+------------+--------+ ICA Mid   117     18      1-39%   heterogenous         +----------+--------+--------+--------+------------+--------+ ICA Distal65      20                                   +----------+--------+--------+--------+------------+--------+ ECA       270             >50%                         +----------+--------+--------+--------+------------+--------+ +----------+--------+-------+--------+------------+           PSV cm/sEDV cmsDescribeArm Pressure +----------+--------+-------+--------+------------+ [id number]  Stenotic             +----------+--------+-------+--------+------------+ +---------+--------+--+--------+--+---------+ VertebralPSV cm/s53EDV cm/s17Antegrade +---------+--------+--+--------+--+---------+ Left Carotid Findings: +----------+--------+--------+--------+--------+--------+           PSV cm/sEDV cm/sStenosisDescribeComments +----------+--------+--------+--------+--------+--------+ CCA Prox  91      15                               +----------+--------+--------+--------+--------+--------+ CCA Distal106     12                               +----------+--------+--------+--------+--------+--------+ ICA Prox  103     18      1-39%                    +----------+--------+--------+--------+--------+--------+ ICA Mid   78      23                               +----------+--------+--------+--------+--------+--------+ ICA Distal79      20                               +----------+--------+--------+--------+--------+--------+ ECA       246                                      +----------+--------+--------+--------+--------+--------+ +----------+--------+--------+----------------+------------+ SubclavianPSV cm/sEDV cm/sDescribe        Arm Pressure +----------+--------+--------+----------------+------------+           185              Multiphasic, WNL             +----------+--------+--------+----------------+------------+ +---------+--------+--+--------+--+---------+ VertebralPSV cm/s44EDV cm/s13Antegrade +---------+--------+--+--------+--+---------+  ABI Findings: +--------+------------------+-----+---------+--------+ Right   Rt Pressure (mmHg)IndexWaveform Comment  +--------+------------------+-----+---------+--------+ [id number]                    triphasic         +--------+------------------+-----+---------+--------+ PTA     167               1.08 triphasic         +--------+------------------+-----+---------+--------+ DP      156               1.01 triphasic         +--------+------------------+-----+---------+--------+ +--------+------------------+-----+---------+-------+ Left    Lt Pressure (mmHg)IndexWaveform Comment +--------+------------------+-----+---------+-------+ [id number]                    triphasic        +--------+------------------+-----+---------+-------+ PTA     158               1.03 triphasic        +--------+------------------+-----+---------+-------+ DP      155               1.01 triphasic        +--------+------------------+-----+---------+-------+ +-------+---------------+----------------+ ABI/TBIToday's ABI/TBIPrevious ABI/TBI +-------+---------------+----------------+ Right  1.06                            +-------+---------------+----------------+ Left   1.03                            +-------+---------------+----------------+  Right Doppler Findings: +--------+--------+-----+---------+--------+ Site    PressureIndexDoppler  Comments +--------+--------+-----+---------+--------+ [id number]          triphasic         +--------+--------+-----+---------+--------+ Radial               triphasic         +--------+--------+-----+---------+--------+ Ulnar                triphasic          +--------+--------+-----+---------+--------+  Left Doppler Findings: +--------+--------+-----+---------+--------+ Site    PressureIndexDoppler  Comments +--------+--------+-----+---------+--------+ [id number]          triphasic         +--------+--------+-----+---------+--------+ Radial               triphasic         +--------+--------+-----+---------+--------+ Ulnar                triphasic         +--------+--------+-----+---------+--------+   Summary: Right Carotid: Velocities in the right ICA are consistent with a 40-59%                stenosis. The ECA appears >50% stenosed. Left Carotid: Velocities in the left ICA are consistent with a 1-39% stenosis. Vertebrals:  Bilateral vertebral arteries demonstrate antegrade flow. Subclavians: Right subclavian artery was stenotic. Normal flow hemodynamics were              seen in the left subclavian artery. Right ABI: Resting right ankle-brachial index is within normal range. Left ABI: Resting left ankle-brachial index is within normal range. Bilateral Extremity: Doppler waveforms remain within normal limits with compression bilaterally for the radial arteries. Doppler waveforms remain within normal limits with compression bilaterally for the ulnar arteries.  Electronically signed by Carolynn Sayers on 12/12/2022 at 1:31:26 PM.    Final    ECHOCARDIOGRAM COMPLETE  Result Date: 12/12/2022    ECHOCARDIOGRAM REPORT   Patient Name:   Christine [name] Date of Exam: 12/12/2022 Medical Rec #:  [id number]         Height:       65.0 in Accession #:    [id number]        Weight:       173.0 lb Date of Birth:  05-11-1933         BSA:          1.860 m Patient Age:    89 years          BP:           166/70 mmHg Patient Gender: F                 HR:           48 bpm. Exam Location:  Inpatient Procedure: 2D Echo, Cardiac Doppler and Color Doppler Indications:    CAD  History:        Patient has prior history of Echocardiogram examinations.  Referring Phys:  [id number] STEVEN C HENDRICKSON IMPRESSIONS  1. Left ventricular ejection fraction, by estimation, is 60 to 65%. The left ventricle has normal function. The left ventricle has no regional wall motion abnormalities. Left ventricular diastolic parameters are consistent with Grade I diastolic dysfunction (impaired relaxation).  2. Right ventricular systolic function is normal. The right ventricular size is normal. Tricuspid regurgitation signal is inadequate for assessing PA pressure.  3. The mitral valve is normal in structure. No evidence of mitral valve regurgitation. No evidence of mitral stenosis.  4.  The aortic valve is tricuspid. Aortic valve regurgitation is not visualized. No aortic stenosis is present.  5. The inferior vena cava is normal in size with greater than 50% respiratory variability, suggesting right atrial pressure of 3 mmHg. FINDINGS  Left Ventricle: Left ventricular ejection fraction, by estimation, is 60 to 65%. The left ventricle has normal function. The left ventricle has no regional wall motion abnormalities. The left ventricular internal cavity size was normal in size. There is  no left ventricular hypertrophy. Left ventricular diastolic parameters are consistent with Grade I diastolic dysfunction (impaired relaxation). Right Ventricle: The right ventricular size is normal. No increase in right ventricular wall thickness. Right ventricular systolic function is normal. Tricuspid regurgitation signal is inadequate for assessing PA pressure. Left Atrium: Left atrial size was normal in size. Right Atrium: Right atrial size was normal in size. Pericardium: There is no evidence of pericardial effusion. Mitral Valve: The mitral valve is normal in structure. Mild mitral annular calcification. No evidence of mitral valve regurgitation. No evidence of mitral valve stenosis. Tricuspid Valve: The tricuspid valve is normal in structure. Tricuspid valve regurgitation is not demonstrated. Aortic Valve: The aortic  valve is tricuspid. Aortic valve regurgitation is not visualized. No aortic stenosis is present. Pulmonic Valve: The pulmonic valve was normal in structure. Pulmonic valve regurgitation is not visualized. Aorta: The aortic root is normal in size and structure. Venous: The inferior vena cava is normal in size with greater than 50% respiratory variability, suggesting right atrial pressure of 3 mmHg. IAS/Shunts: No atrial level shunt detected by color flow Doppler.  LEFT VENTRICLE PLAX 2D LVIDd:         5.00 cm   Diastology LVIDs:         3.40 cm   LV e' medial:    7.72 cm/s LV PW:         0.70 cm   LV E/e' medial:  10.5 LV IVS:        0.70 cm   LV e' lateral:   5.87 cm/s LVOT diam:     1.80 cm   LV E/e' lateral: 13.8 LV SV:         48 LV SV Index:   26 LVOT Area:     2.54 cm  RIGHT VENTRICLE             IVC RV S prime:     12.30 cm/s  IVC diam: 1.90 cm TAPSE (M-mode): 2.2 cm LEFT ATRIUM             Index        RIGHT ATRIUM          Index LA diam:        3.60 cm 1.94 cm/m   RA Area:     9.93 cm LA Vol (A2C):   34.6 ml 18.60 ml/m  RA Volume:   19.00 ml 10.22 ml/m LA Vol (A4C):   45.6 ml 24.52 ml/m LA Biplane Vol: 40.2 ml 21.61 ml/m  AORTIC VALVE LVOT Vmax:   84.50 cm/s LVOT Vmean:  53.800 cm/s LVOT VTI:    0.190 m  AORTA Ao Root diam: 2.90 cm Ao Asc diam:  2.70 cm MITRAL VALVE MV Area (PHT): 2.52 cm    SHUNTS MV Decel Time: 301 msec    Systemic VTI:  0.19 m MV E velocity: 81.20 cm/s  Systemic Diam: 1.80 cm MV A velocity: 77.80 cm/s MV E/A ratio:  1.04 Dalton McleanMD Electronically signed by Wilfred Lacy Signature Date/Time:  12/12/2022/10:38:51 AM    Final    CARDIAC CATHETERIZATION  Result Date: 12/11/2022 Conclusions: Significant multivessel coronary artery disease, including sequential 60% ostial and 50% mid LAD stenoses, complex ostial LCx stenosis involving trifurcation of OM1, OM2, and LCx proper with hazy 90% stenosis, and diffuse calcified RCA disease of up to 70%. Normal left ventricular systolic  function (LVEF 55-65%) and filling pressure (LVEDP 10 mmHg). Recommendations: Given severe multivessel CAD and continued vague chest pain at rest and with activity, we will admit Christine [name] for medical optimization and cardiac surgery consultation for CABG. Continue aggressive secondary prevention of coronary artery disease. Obtain echocardiogram. Yvonne Kendall, MD [facility]  CT CORONARY FFR DATA PREP & FLUID ANALYSIS  Result Date: 11/30/2022 EXAM: CT FFR ANALYSIS CLINICAL DATA:  Abnormal CCTA FINDINGS: FFRct analysis was performed on the original cardiac CT angiogram dataset. Diagrammatic representation of the FFRct analysis is provided in a separate PDF document in PACS. This dictation was created using the PDF document and an interactive 3D model of the results. 3D model is not available in the EMR/PACS. Normal FFR range is >0.80. 1. Left Main:  No significant stenosis. 2. LAD: significant stenosis in mid LAD.  FFRct 0.73 3. LCX: No significant stenosis (borderline).  FFRct 0.80 4. RCA: significant stenosis in proximal RCA.  FFRct 0.75 IMPRESSION: 1. CT FFR analysis showed significant stenosis in mid LAD and proximal RCA. 2.  Cardiac catheterization recommended. Electronically Signed   By: Debbe Odea M.D.   On: 11/30/2022 17:20     Treatments: surgery: ***   Operative Report    DATE OF PROCEDURE: 12/13/2022   PREOPERATIVE DIAGNOSIS:  Three-vessel coronary artery disease with new onset angina.   POSTOPERATIVE DIAGNOSIS:  Three-vessel coronary artery disease with new onset angina.   PROCEDURES PERFORMED:  Median sternotomy, extracorporeal circulation, coronary artery bypass grafting x 4 (left internal mammary artery to LAD, saphenous vein graft to distal right coronary, sequential saphenous vein graft to obtuse marginals 1 and 2)  and endoscopic vein harvest, right leg.   SURGEON:  Salvatore Decent. Aalivia Fetch, MD   ASSISTANT:  Gershon Crane.  Discharge Exam: Blood pressure (!)  129/58, pulse 69, temperature 97.6 F (36.4 C), temperature source Oral, resp. rate (!) 21, height 5\' 5"  (1.651 m), weight 86.9 kg, SpO2 96%. {physical [id number]   Discharge Medications:  The patient has been discharged on:   1.Beta Blocker:  Yes [   ]                              No   [   ]                              If No, reason:  2.Ace Inhibitor/ARB: Yes [   ]                                     No  [    ]                                     If No, reason:  3.Statin:   Yes [   ]  No  [   ]                  If No, reason:  4.Ecasa:  Yes  [   ]                  No   [   ]                  If No, reason:  Patient had ACS upon admission:  Plavix/P2Y12 inhibitor: Yes [   ]                                      No  [   ]     Discharge Instructions     AMB Referral to Cardiac Rehabilitation - Phase II   Complete by: As directed    Diagnosis: CABG   CABG X ___: 4   After initial evaluation and assessments completed: Virtual Based Care may be provided alone or in conjunction with Phase 2 Cardiac Rehab based on patient barriers.: Yes   Intensive Cardiac Rehabilitation (ICR) [facility] location only OR Traditional Cardiac Rehabilitation (TCR) *If criteria for ICR are not met will enroll in TCR [facility] only): Yes      Allergies as of 12/15/2022       Reactions   Atorvastatin    Myalgias   Pollen Extract    Sneezing, coughing      Med Rec must be completed prior to using this SMARTLINK***        Signed:  Rowe Clack, PA-C  12/15/2022, 11:06 AM

## 2022-12-16 ENCOUNTER — Inpatient Hospital Stay (HOSPITAL_COMMUNITY): Payer: Medicare Other

## 2022-12-16 LAB — BASIC METABOLIC PANEL
Anion gap: 9 (ref 5–15)
BUN: 14 mg/dL (ref 8–23)
CO2: 26 mmol/L (ref 22–32)
Calcium: 8.5 mg/dL — ABNORMAL LOW (ref 8.9–10.3)
Chloride: 93 mmol/L — ABNORMAL LOW (ref 98–111)
Creatinine, Ser: 0.8 mg/dL (ref 0.44–1.00)
GFR, Estimated: >60 mL/min (ref >60)
Glucose, Bld: 126 mg/dL — ABNORMAL HIGH (ref 70–99)
Potassium: 4.3 mmol/L (ref 3.5–5.1)
Sodium: 128 mmol/L — ABNORMAL LOW (ref 135–145)

## 2022-12-16 LAB — CBC
HCT: 24.4 % — ABNORMAL LOW (ref 36.0–46.0)
Hemoglobin: 8.2 g/dL — ABNORMAL LOW (ref 12.0–15.0)
MCH: 30.4 pg (ref 26.0–34.0)
MCHC: 33.6 g/dL (ref 30.0–36.0)
MCV: 90.4 fL (ref 80.0–100.0)
Platelets: 107 10*3/uL — ABNORMAL LOW (ref 150–400)
RBC: 2.7 MIL/uL — ABNORMAL LOW (ref 3.87–5.11)
RDW: 12.8 % (ref 11.5–15.5)
WBC: 10.3 10*3/uL (ref 4.0–10.5)
nRBC: 0 % (ref 0.0–0.2)

## 2022-12-16 LAB — GLUCOSE, CAPILLARY
Glucose-Capillary: 132 mg/dL — ABNORMAL HIGH (ref 70–99)
Glucose-Capillary: 118 mg/dL — ABNORMAL HIGH (ref 70–99)
Glucose-Capillary: 113 mg/dL — ABNORMAL HIGH (ref 70–99)
Glucose-Capillary: 101 mg/dL — ABNORMAL HIGH (ref 70–99)

## 2022-12-16 LAB — MAGNESIUM: Magnesium: 1.8 mg/dL (ref 1.7–2.4)

## 2022-12-16 MED ORDER — AMIODARONE HCL 200 MG PO TABS
400.0000 mg | ORAL_TABLET | Freq: Every day | ORAL | Status: DC
  Administered 2022-12-16 – 2022-12-18 (×3): 400 mg via ORAL
  Filled 2022-12-16 (×3): qty 2

## 2022-12-16 NOTE — Progress Notes (Signed)
      [address]       [place] [address]             [phone]                 3 Days Post-Op Procedure(s) (LRB): CORONARY ARTERY BYPASS GRAFTING (CABG) TIMES FOUR UTILIZING LEFT INTERNAL MAMMARY ARTERY AND ENDOSCOPIC VEIN HARVEST RIGHT GREATER SAPHENOUS VEIN (N/A) TRANSESOPHAGEAL ECHOCARDIOGRAM (N/A)   Events: Afib overnight.  Chemically converted _______________________________________________________________ Vitals: BP 96/77   Pulse 62   Temp 97.7 F (36.5 C) (Oral)   Resp 13   Ht 5\' 5"  (1.651 m)   Wt 85.7 kg   SpO2 98%   BMI 31.44 kg/m  Filed Weights   12/14/22 0600 12/15/22 0630 12/16/22 0500  Weight: 85.4 kg 86.9 kg 85.7 kg     - Neuro: arousable  - Cardiovascular: sinus  Drips: amio.      - Pulm: EWOB    ABG    Component Value Date/Time   PHART 7.332 (L) 12/13/2022 1905   PCO2ART 43.6 12/13/2022 1905   PO2ART 74 (L) 12/13/2022 1905   HCO3 23.2 12/13/2022 1905   TCO2 25 12/13/2022 1905   ACIDBASEDEF 3.0 (H) 12/13/2022 1905   O2SAT 94 12/13/2022 1905    - Abd: ND - Extremity: trace edema  .Intake/Output      10/04 0701 10/05 0700 10/05 0701 10/06 0700   P.O. 240    I.V. (mL/kg) 40 (0.5)    IV Piggyback     Total Intake(mL/kg) 280 (3.3)    Urine (mL/kg/hr) 940 (0.5) 200 (1.1)   Chest Tube     Total Output 940 200   Net -660 -200           _______________________________________________________________ Labs:    Latest Ref Rng & Units 12/16/2022    4:18 AM 12/15/2022    4:12 AM 12/14/2022    4:51 PM  CBC  WBC 4.0 - 10.5 K/uL 10.3  10.5  10.7   Hemoglobin 12.0 - 15.0 g/dL 8.2  8.6  9.1   Hematocrit 36.0 - 46.0 % 24.4  26.1  27.6   Platelets 150 - 400 K/uL 107  101  102       Latest Ref Rng & Units 12/16/2022    4:18 AM 12/15/2022    4:12 AM 12/14/2022    4:51 PM  CMP  Glucose 70 - 99 mg/dL 914  782  956   BUN 8 - 23 mg/dL 14  9  9    Creatinine 0.44 - 1.00 mg/dL 2.13  0.86  5.78   Sodium 135 - 145 mmol/L  128  129  130   Potassium 3.5 - 5.1 mmol/L 4.3  4.2  3.4   Chloride 98 - 111 mmol/L 93  96  96   CO2 22 - 32 mmol/L 26  27  26    Calcium 8.9 - 10.3 mg/dL 8.5  8.3  8.3     CXR: L effusion  _______________________________________________________________  Assessment and Plan: POD 3 s/p CABG  Neuro: pain controlled.  CV: on A/S/BB.  On amio gtt.  Will start PO Pulm: IS, ambulation Renal: creat stable.  diuresing GI: on diet Heme: stable ID: afebrile Endo: SSI Dispo: awaiting floor bed.   Corliss Skains 12/16/2022 9:11 AM

## 2022-12-16 NOTE — Plan of Care (Signed)
  Problem: Education: Goal: Knowledge of General Education information will improve Description: Including pain rating scale, medication(s)/side effects and non-pharmacologic comfort measures Outcome: Progressing   Problem: Activity: Goal: Risk for activity intolerance will decrease Outcome: Progressing   Problem: Coping: Goal: Level of anxiety will decrease Outcome: Progressing   Problem: Elimination: Goal: Will not experience complications related to urinary retention Outcome: Progressing   

## 2022-12-17 LAB — GLUCOSE, CAPILLARY
Glucose-Capillary: 97 mg/dL (ref 70–99)
Glucose-Capillary: 112 mg/dL — ABNORMAL HIGH (ref 70–99)
Glucose-Capillary: 107 mg/dL — ABNORMAL HIGH (ref 70–99)
Glucose-Capillary: 99 mg/dL (ref 70–99)
Glucose-Capillary: 101 mg/dL — ABNORMAL HIGH (ref 70–99)

## 2022-12-17 MED ORDER — LACTULOSE 10 GM/15ML PO SOLN
20.0000 g | Freq: Once | ORAL | Status: AC
  Administered 2022-12-17: 20 g via ORAL
  Filled 2022-12-17: qty 30

## 2022-12-17 MED ORDER — CARMEX CLASSIC LIP BALM EX OINT
TOPICAL_OINTMENT | CUTANEOUS | Status: DC | PRN
  Filled 2022-12-17: qty 10

## 2022-12-17 NOTE — Progress Notes (Addendum)
      [address]       [place] [address]             [phone]      4 Days Post-Op Procedure(s) (LRB): CORONARY ARTERY BYPASS GRAFTING (CABG) TIMES FOUR UTILIZING LEFT INTERNAL MAMMARY ARTERY AND ENDOSCOPIC VEIN HARVEST RIGHT GREATER SAPHENOUS VEIN (N/A) TRANSESOPHAGEAL ECHOCARDIOGRAM (N/A)  Subjective:  Patient states she has to go to the bathroom.  Also complains of IV machine beeping.  Has not yet moved her bowels.. Only ambulated 87ft with mobility yesterday  Objective: Vital signs in last 24 hours: Temp:  [97.5 F (36.4 C)-98.4 F (36.9 C)] 98.2 F (36.8 C) (10/06 0756) Pulse Rate:  [57-71] 57 (10/06 0320) Cardiac Rhythm: Normal sinus rhythm (10/05 1943) Resp:  [13-20] 18 (10/06 0320) BP: (110-156)/(49-93) 127/59 (10/06 0756) SpO2:  [93 %-99 %] 99 % (10/06 0320) FiO2 (%):  [28 %] 28 % (10/05 1230)  Intake/Output from previous day: 10/05 0701 - 10/06 0700 In: 200 [P.O.:200] Out: 200 [Urine:200]  General appearance: alert, cooperative, and no distress Heart: regular rate and rhythm Lungs: clear to auscultation bilaterally Abdomen: soft, non-tender; bowel sounds normal; no masses,  no organomegaly Extremities: edema trace Wound: clean and dry  Lab Results: Recent Labs    12/15/22 0412 12/16/22 0418  WBC 10.5 10.3  HGB 8.6* 8.2*  HCT 26.1* 24.4*  PLT 101* 107*   BMET:  Recent Labs    12/15/22 0412 12/16/22 0418  NA 129* 128*  K 4.2 4.3  CL 96* 93*  CO2 27 26  GLUCOSE 109* 126*  BUN 9 14  CREATININE 0.82 0.80  CALCIUM 8.3* 8.5*    PT/INR: No results for input(s): "LABPROT", "INR" in the last 72 hours. ABG    Component Value Date/Time   PHART 7.332 (L) 12/13/2022 1905   HCO3 23.2 12/13/2022 1905   TCO2 25 12/13/2022 1905   ACIDBASEDEF 3.0 (H) 12/13/2022 1905   O2SAT 94 12/13/2022 1905   CBG (last 3)  Recent Labs    12/16/22 2043 12/17/22 0545 12/17/22 0806  GLUCAP 101* 97 112*    Assessment/Plan: S/P  Procedure(s) (LRB): CORONARY ARTERY BYPASS GRAFTING (CABG) TIMES FOUR UTILIZING LEFT INTERNAL MAMMARY ARTERY AND ENDOSCOPIC VEIN HARVEST RIGHT GREATER SAPHENOUS VEIN (N/A) TRANSESOPHAGEAL ECHOCARDIOGRAM (N/A)  CV- PAF, maintaining NSR- she is on Lopressor at 12.5 mg BID, she has already been started on oral Amiodarone yesterday, however her Amiodarone drip is still running will d/c Pulm- no acute issues, wean oxygen as tolerated Renal-her weight remains elevated, creatinine has been stable.. will continue Lasix, potassium GI- constipation, unable to move bowels, will add Lactulose Cbgs- stable, patient is not a diabetic will d/c SSIP Deconditioning- only ambulated 12 ft with mobility tech, will get PT/OT consults.. will likely benefit from rehab  LOS: 6 days    Lowella Dandy, PA-C 12/17/2022  Agree with above Dispo planning  Aarish Rockers O Eon Zunker
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
## 2022-12-17 NOTE — Progress Notes (Signed)
Mobility Specialist Progress Note    12/17/22 0927  Mobility  Activity Ambulated with assistance in room  Level of Assistance Minimal assist, patient does 75% or more  Assistive Device Other (Comment) (HHA)  Distance Ambulated (ft) 12 ft  Activity Response Tolerated fair  Mobility Referral Yes  $Mobility charge 1 Mobility  Mobility Specialist Start Time (ACUTE ONLY) [id number]  Mobility Specialist Stop Time (ACUTE ONLY) 0925  Mobility Specialist Time Calculation (min) (ACUTE ONLY) 7 min   During Mobility: 77 HR Post-Mobility: 69 HR, 164/62 (91) BP, 93% SpO2  Pt received in bathroom and declining further ambulation c/o feeling woozy. Pt stated she was not able to have much of a BM and mostly passed flatus. Returned to bed and left with call bell in reach. RN and NT notified. Will f/u as schedule permits.   [place] Mobility Specialist  Please Neurosurgeon or Rehab Office at [phone]

## 2022-12-18 ENCOUNTER — Ambulatory Visit: Payer: Medicare Other | Admitting: Physician Assistant

## 2022-12-18 LAB — GLUCOSE, CAPILLARY
Glucose-Capillary: 86 mg/dL (ref 70–99)
Glucose-Capillary: 118 mg/dL — ABNORMAL HIGH (ref 70–99)
Glucose-Capillary: 90 mg/dL (ref 70–99)
Glucose-Capillary: 87 mg/dL (ref 70–99)

## 2022-12-18 MED ORDER — AMIODARONE HCL 200 MG PO TABS
200.0000 mg | ORAL_TABLET | Freq: Every day | ORAL | Status: DC
  Administered 2022-12-19 – 2022-12-21 (×3): 200 mg via ORAL
  Filled 2022-12-18 (×3): qty 1

## 2022-12-18 NOTE — Progress Notes (Signed)
Mobility Specialist Progress Note:   12/18/22 1503  Mobility  Activity Ambulated with assistance to bathroom  Level of Assistance Standby assist, set-up cues, supervision of patient - no hands on  Assistive Device None  Distance Ambulated (ft) 15 ft  RUE Weight Bearing NWB  LUE Weight Bearing NWB  Activity Response Tolerated well  Mobility Referral Yes  $Mobility charge 1 Mobility  Mobility Specialist Start Time (ACUTE ONLY) 1450  Mobility Specialist Stop Time (ACUTE ONLY) 1502  Mobility Specialist Time Calculation (min) (ACUTE ONLY) 12 min   Post Mobility: 71 HR  Pt received in bed, requesting to use BR. Prior to standing c/o slight SOB and nausea. Pt needing 2x attempts to stand independently within precautions. No hands on or AD needed to ambulate to BR from bedside. Pt left in BR and reminded to pull call bell when ready for assistance.    Leory Plowman  Mobility Specialist Please contact via [employer] office at [phone]

## 2022-12-18 NOTE — Progress Notes (Signed)
CARDIAC REHAB PHASE I   PRE:  Rate/Rhythm: 61 SR    BP: sitting 146/74    SpO2: 95 RA  MODE:  Ambulation: 40 ft   POST:  Rate/Rhythm: 84 SR    BP: sitting 133/71     SpO2: 91-93 RA  Pt stood with rocking and walked with rollator. Slow and steady with contact guard. Became nauseated in hall with the sensation to vomit. Unable however was able to belch with relief of nausea. To BR then bed. VSS, daughter currently with her. Daughter has significant limitations to care for her mother (daughter needed my assistance in room).   [phone]  Ethelda Chick BS, ACSM-CEP 12/18/2022 12:09 PM

## 2022-12-18 NOTE — TOC Progression Note (Signed)
Transition of Care [facility]) - Progression Note    Patient Details  Name: Christine [name] MRN: [id number] Date of Birth: [birth year]  [facility]) CM/SW Contact  Eduard Roux, [place] Phone Number: 12/18/2022, 11:32 AM  Clinical Narrative:     CSW acknowledges consult for SNF placement.  Current recommendation is for [facility]. Will follow for for any changes in the patient's progress and recommendations.  Antony Blackbird, MSW, LCSW Clinical Social Worker     Expected Discharge Plan: Home w Home Health Services Barriers to Discharge: Continued Medical Work up  Expected Discharge Plan and Services   Discharge Planning Services: CM Consult Post Acute Care Choice: Home Health Living arrangements for the past 2 months: Single Family Home                   DME Agency: NA                   Social Determinants of Health (SDOH) Interventions SDOH Screenings   Food Insecurity: No Food Insecurity (12/11/2022)  Housing: Low Risk  (12/11/2022)  Transportation Needs: No Transportation Needs (12/11/2022)  Utilities: Not At Risk (12/11/2022)  Depression (PHQ2-9): Low Risk  (10/20/2022)  Recent Concern: Depression (PHQ2-9) - Medium Risk (08/15/2022)  Tobacco Use: Low Risk  (12/13/2022)    Readmission Risk Interventions     No data to display
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
## 2022-12-18 NOTE — Progress Notes (Signed)
Mobility Specialist Progress Note:   12/18/22 1528  Mobility  Activity Ambulated with assistance in hallway  Level of Assistance Contact guard assist, steadying assist  Assistive Device Four wheel walker  Distance Ambulated (ft) 175 ft  RUE Weight Bearing NWB  LUE Weight Bearing NWB  Activity Response Tolerated well  Mobility Referral Yes  $Mobility charge 1 Mobility  Mobility Specialist Start Time (ACUTE ONLY) 1510  Mobility Specialist Stop Time (ACUTE ONLY) 1520  Mobility Specialist Time Calculation (min) (ACUTE ONLY) 10 min   During Mobility: 83 HR Post Mobility: 61 HR , 93% SpO2 RA  Pt received in bed, agreeable to mobility with encouragement. Pt c/o nausea and SOB during ambulation, pursed lip breathing encouraged. Seated rest break required d/t lightheadedness. Pt rated 9/10. Pt able to ambulate back to room without fault and left in bed with call bell in reach and all needs met.  Leory Plowman  Mobility Specialist Please contact via [employer] office at [phone]
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
## 2022-12-18 NOTE — Progress Notes (Addendum)
Pt initially agreeable to ambulation however after pre-vitals were obtained pt started c/p of nausea and a weird sensation in her chest. EKG shows SR with PACs. Pt reports not eating since yesterday morning, We ordered breakfast and will revisit after she eats.  Faustino Congress 12/18/2022 9:13 AM

## 2022-12-18 NOTE — Care Management Important Message (Signed)
Important Message  Patient Details  Name: Christine [name] MRN: [id number] Date of Birth: 02/05/1934   Important Message Given:  Yes - Medicare IM     Sherilyn Banker 12/18/2022, 2:28 PM

## 2022-12-19 LAB — BASIC METABOLIC PANEL
Anion gap: 10 (ref 5–15)
BUN: 11 mg/dL (ref 8–23)
CO2: 30 mmol/L (ref 22–32)
Calcium: 8.8 mg/dL — ABNORMAL LOW (ref 8.9–10.3)
Chloride: 88 mmol/L — ABNORMAL LOW (ref 98–111)
Creatinine, Ser: 0.84 mg/dL (ref 0.44–1.00)
GFR, Estimated: >60 mL/min (ref >60)
Glucose, Bld: 95 mg/dL (ref 70–99)
Potassium: 3.5 mmol/L (ref 3.5–5.1)
Sodium: 128 mmol/L — ABNORMAL LOW (ref 135–145)

## 2022-12-19 LAB — GLUCOSE, CAPILLARY
Glucose-Capillary: 90 mg/dL (ref 70–99)
Glucose-Capillary: 109 mg/dL — ABNORMAL HIGH (ref 70–99)
Glucose-Capillary: 107 mg/dL — ABNORMAL HIGH (ref 70–99)
Glucose-Capillary: 131 mg/dL — ABNORMAL HIGH (ref 70–99)

## 2022-12-19 LAB — CBC
HCT: 27.6 % — ABNORMAL LOW (ref 36.0–46.0)
Hemoglobin: 9.3 g/dL — ABNORMAL LOW (ref 12.0–15.0)
MCH: 30.2 pg (ref 26.0–34.0)
MCHC: 33.7 g/dL (ref 30.0–36.0)
MCV: 89.6 fL (ref 80.0–100.0)
Platelets: 253 10*3/uL (ref 150–400)
RBC: 3.08 MIL/uL — ABNORMAL LOW (ref 3.87–5.11)
RDW: 13 % (ref 11.5–15.5)
WBC: 7.1 10*3/uL (ref 4.0–10.5)
nRBC: 0.3 % — ABNORMAL HIGH (ref 0.0–0.2)

## 2022-12-19 MED ORDER — ASPIRIN 81 MG PO TBEC
81.0000 mg | DELAYED_RELEASE_TABLET | Freq: Every day | ORAL | Status: DC
  Administered 2022-12-19 – 2022-12-21 (×3): 81 mg via ORAL
  Filled 2022-12-19 (×3): qty 1

## 2022-12-19 MED ORDER — METOPROLOL SUCCINATE ER 25 MG PO TB24
12.5000 mg | ORAL_TABLET | Freq: Every day | ORAL | Status: DC
  Administered 2022-12-19 – 2022-12-21 (×3): 12.5 mg via ORAL
  Filled 2022-12-19 (×3): qty 1

## 2022-12-19 MED ORDER — POTASSIUM CHLORIDE CRYS ER 20 MEQ PO TBCR
40.0000 meq | EXTENDED_RELEASE_TABLET | Freq: Once | ORAL | Status: AC
  Administered 2022-12-19: 40 meq via ORAL
  Filled 2022-12-19: qty 2

## 2022-12-19 MED ORDER — ENSURE ENLIVE PO LIQD: 237.0000 mL | Freq: Three times a day (TID) | ORAL | Status: DC

## 2022-12-19 NOTE — Plan of Care (Signed)
  Problem: Education: Goal: Understanding of CV disease, CV risk reduction, and recovery process will improve 12/19/2022 1900 by Genevie Ann, RN Outcome: Progressing 12/19/2022 1838 by Genevie Ann, RN Outcome: Progressing

## 2022-12-19 NOTE — Plan of Care (Signed)
  Problem: Education: Goal: Understanding of CV disease, CV risk reduction, and recovery process will improve Outcome: Progressing

## 2022-12-19 NOTE — Progress Notes (Addendum)
Pt working with MT will f/u later.   F/u at 1400 Pt reports she recently worked with PT practicing sit and stands and ambulating in hallway.

## 2022-12-19 NOTE — Progress Notes (Signed)
Epicardial pacing wires removed per MD order without difficulty.  Patient educated on one hour of bedrest and frequent BP checks.

## 2022-12-19 NOTE — Progress Notes (Signed)
Physical Therapy Treatment Patient Details Name: Christine [name] MRN: [id number] DOB: 03-04-1934 Today's Date: 12/19/2022   History of Present Illness Pt is a 89 yo female who underwent CABGx4 on 10/2. PMH: CAD, carotid artery stenosis, HTN, HLD, rheumatic fever, asthma, hypothyroidism, and GERD    PT Comments  Pt received in supine and agreeable to session. Pt reporting increased fatigue today and continues to be limited by impaired activity tolerance. Pt able to perform bed mobility and short gait trial with up to CGA for safety and cues to maintain sternal precautions. Pt also able to perform x5 STS, however requires rest breaks in between each trial due to DOE. Due to limited assistance at home, discharge recommendations have been updated to continued inpatient follow up therapy, <3 hours/day after discussion with supervising PT Alexa S. Pt continues to benefit from PT services to progress toward functional mobility goals.    If plan is discharge home, recommend the following: A little help with walking and/or transfers;A little help with bathing/dressing/bathroom;Assist for transportation   Can travel by private vehicle        Equipment Recommendations  Rolling walker (2 wheels)    Recommendations for Other Services       Precautions / Restrictions Precautions Precautions: Sternal Restrictions Weight Bearing Restrictions: Yes Other Position/Activity Restrictions: sternal precautions     Mobility  Bed Mobility Overal bed mobility: Needs Assistance Bed Mobility: Sidelying to Sit, Sit to Sidelying   Sidelying to sit: Contact guard assist, HOB elevated     Sit to sidelying: Contact guard assist General bed mobility comments: Cues for sternal precautions, but no physical assist needed    Transfers Overall transfer level: Needs assistance Equipment used: None Transfers: Sit to/from Stand Sit to Stand: Contact guard assist, Supervision           General transfer  comment: From EOB x7 with CGA progressing to supervision for safety. Pt with good power up and maintenance of sternal precautions    Ambulation/Gait Ambulation/Gait assistance: Contact guard assist Gait Distance (Feet): 20 Feet Assistive device: Rolling walker (2 wheels) Gait Pattern/deviations: Step-through pattern, Decreased stride length       General Gait Details: Pt demonstrating slow, steady gait with RW support. Distance limited by fatigue       Balance Overall balance assessment: Needs assistance Sitting-balance support: Feet supported, No upper extremity supported Sitting balance-Leahy Scale: Good Sitting balance - Comments: sitting EOB   Standing balance support: Bilateral upper extremity supported, During functional activity Standing balance-Leahy Scale: Fair Standing balance comment: with RW support. Pt able to static stand without UE support                            Cognition Arousal: Alert Behavior During Therapy: WFL for tasks assessed/performed Overall Cognitive Status: Within Functional Limits for tasks assessed                                          Exercises      General Comments        Pertinent Vitals/Pain Pain Assessment Pain Assessment: Faces Faces Pain Scale: Hurts a little bit Pain Location: chest incision Pain Intervention(s): Monitored during session, Repositioned     PT Goals (current goals can now be found in the care plan section) Acute Rehab PT Goals Patient Stated Goal: feel better PT Goal Formulation:  With patient Time For Goal Achievement: 12/31/22 Progress towards PT goals: Progressing toward goals    Frequency    Min 1X/week       AM-PAC PT "6 Clicks" Mobility   Outcome Measure  Help needed turning from your back to your side while in a flat bed without using bedrails?: A Little Help needed moving from lying on your back to sitting on the side of a flat bed without using bedrails?:  A Little Help needed moving to and from a bed to a chair (including a wheelchair)?: A Little Help needed standing up from a chair using your arms (e.g., wheelchair or bedside chair)?: A Little Help needed to walk in hospital room?: A Little Help needed climbing 3-5 steps with a railing? : A Lot 6 Click Score: 17    End of Session   Activity Tolerance: Patient tolerated treatment well;Patient limited by fatigue Patient left: with call bell/phone within reach;in bed Nurse Communication: Mobility status PT Visit Diagnosis: Unsteadiness on feet (R26.81)     Time: 8119-1478 PT Time Calculation (min) (ACUTE ONLY): 21 min  Charges:    $Therapeutic Activity: 8-22 mins PT General Charges $$ ACUTE PT VISIT: 1 Visit                     Johny Shock, PTA Acute Rehabilitation Services Secure Chat Preferred  [phone]    Johny Shock 12/19/2022, 4:04 PM
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
## 2022-12-19 NOTE — Progress Notes (Addendum)
Mobility Specialist Progress Note:   12/19/22 1157  Mobility  Activity Ambulated with assistance in hallway  Level of Assistance Contact guard assist, steadying assist  Assistive Device Four wheel walker  RUE Weight Bearing NWB  LUE Weight Bearing NWB  Activity Response Tolerated well  Mobility Referral Yes  $Mobility charge 1 Mobility  Mobility Specialist Start Time (ACUTE ONLY) 1105  Mobility Specialist Stop Time (ACUTE ONLY) 1115  Mobility Specialist Time Calculation (min) (ACUTE ONLY) 10 min   During Mobility: 75 HR , 88%-97% SpO2 RA  Pt received in bed, agreeable to mobility. Pt able to tolerate greater gait distance (217ft). Seated rest break required d/t lightheadedness and slight SOB. Pursed lip breathing encouraged. Pt able to ambulate back to room without fault. C/o leg weakness but motivated to walk back to room without second break. Pt left in bed with call bell in reach and all needs met.   Leory Plowman  Mobility Specialist Please contact via [employer] office at [phone]

## 2022-12-19 NOTE — TOC Progression Note (Signed)
Transition of Care [facility]) - Progression Note    Patient Details  Name: Christine [name] MRN: [id number] Date of Birth: [birth year]  Transition [facility]) CM/SW Contact  Eduard Roux, [place] Phone Number: 12/19/2022, 10:53 AM  Clinical Narrative:     CSW met with patient at bedside. CSW introduced self and explained role. Patient states she lives in the home with her daughter (recently involved in car accident & fall at home) and her grandson (has special needs). She states she usually help take care of her grandson but she is unable to do so at this time. She reports her daughter has a HH aide for 28 hrs per week and she trying to get the hours increased. She states her preference is to go home but she is agreeable to go to rehab. CSW explained current recommendation is  for [facility] but CSW will continue to follow and assist with disposition plan. CSW explained the SNF process. Preference is SNF in the [place] or [place] area.   TOC will continue to follow  Antony Blackbird, MSW, LCSW Clinical Social Worker      Barriers to Discharge: Continued Medical Work up  Expected Discharge Plan and Services   Discharge Planning Services: CM Consult Post Acute Care Choice: Home Health Living arrangements for the past 2 months: Single Family Home                   DME Agency: NA                   Social Determinants of Health (SDOH) Interventions SDOH Screenings   Food Insecurity: No Food Insecurity (12/11/2022)  Housing: Low Risk  (12/11/2022)  Transportation Needs: No Transportation Needs (12/11/2022)  Utilities: Not At Risk (12/11/2022)  Depression (PHQ2-9): Low Risk  (10/20/2022)  Recent Concern: Depression (PHQ2-9) - Medium Risk (08/15/2022)  Tobacco Use: Low Risk  (12/13/2022)    Readmission Risk Interventions     No data to display

## 2022-12-20 LAB — GLUCOSE, CAPILLARY
Glucose-Capillary: 102 mg/dL — ABNORMAL HIGH (ref 70–99)
Glucose-Capillary: 123 mg/dL — ABNORMAL HIGH (ref 70–99)
Glucose-Capillary: 107 mg/dL — ABNORMAL HIGH (ref 70–99)
Glucose-Capillary: 107 mg/dL — ABNORMAL HIGH (ref 70–99)

## 2022-12-20 MED ORDER — SODIUM CHLORIDE 0.9% FLUSH
3.0000 mL | Freq: Two times a day (BID) | INTRAVENOUS | Status: DC
  Administered 2022-12-20 – 2022-12-21 (×3): 3 mL via INTRAVENOUS

## 2022-12-20 NOTE — Progress Notes (Addendum)
7 Days Post-Op Procedure(s) (LRB): CORONARY ARTERY BYPASS GRAFTING (CABG) TIMES FOUR UTILIZING LEFT INTERNAL MAMMARY ARTERY AND ENDOSCOPIC VEIN HARVEST RIGHT GREATER SAPHENOUS VEIN (N/A) TRANSESOPHAGEAL ECHOCARDIOGRAM (N/A) Subjective: Feels ok, some productive cough  Objective: Vital signs in last 24 hours: Temp:  [97.4 F (36.3 C)-98.2 F (36.8 C)] 98.2 F (36.8 C) (10/09 0412) Pulse Rate:  [56-96] 56 (10/09 0650) Cardiac Rhythm: Atrial fibrillation;Bundle branch block (10/08 2034) Resp:  [11-19] 16 (10/08 1712) BP: (116-175)/(57-106) 120/62 (10/09 0412) SpO2:  [87 %-98 %] 89 % (10/09 0650) Weight:  [82.9 kg] 82.9 kg (10/09 0650)  Hemodynamic parameters for last 24 hours:    Intake/Output from previous day: No intake/output data recorded. Intake/Output this shift: No intake/output data recorded.  General appearance: alert, cooperative, and no distress Heart: regular rate and rhythm Lungs: slightly coarse- improves with cough Abdomen: benign Extremities: no edema Wound: incis healing well  Lab Results: Recent Labs    12/19/22 0734  WBC 7.1  HGB 9.3*  HCT 27.6*  PLT 253   BMET:  Recent Labs    12/19/22 0734  NA 128*  K 3.5  CL 88*  CO2 30  GLUCOSE 95  BUN 11  CREATININE 0.84  CALCIUM 8.8*    PT/INR: No results for input(s): "LABPROT", "INR" in the last 72 hours. ABG    Component Value Date/Time   PHART 7.332 (L) 12/13/2022 1905   HCO3 23.2 12/13/2022 1905   TCO2 25 12/13/2022 1905   ACIDBASEDEF 3.0 (H) 12/13/2022 1905   O2SAT 94 12/13/2022 1905   CBG (last 3)  Recent Labs    12/19/22 1715 12/19/22 2120 12/20/22 0606  GLUCAP 107* 131* 102*    Meds Scheduled Meds:  amiodarone  200 mg Oral Daily   aspirin EC  81 mg Oral Daily   bisacodyl  10 mg Oral Daily   Or   bisacodyl  10 mg Rectal Daily   [facility], Patient & Family Education   Does not apply Once   docusate sodium  200 mg Oral Daily   enoxaparin (LOVENOX)  injection  40 mg Subcutaneous QHS   feeding supplement  237 mL Oral TID WC   furosemide  40 mg Oral Daily   metoprolol succinate  12.5 mg Oral Daily   pantoprazole  40 mg Oral Daily   potassium chloride SA  20 mEq Oral Daily   sodium chloride flush  3 mL Intravenous Q12H   Continuous Infusions:  sodium chloride     PRN Meds:.sodium chloride, albuterol, alum & mag hydroxide-simeth, fluticasone furoate-vilanterol, lip balm, loratadine, magnesium hydroxide, metoprolol tartrate, ondansetron (ZOFRAN) IV, mouth rinse, oxyCODONE, sodium chloride flush, traMADol  Xrays No results found.  Assessment/Plan: S/P Procedure(s) (LRB): CORONARY ARTERY BYPASS GRAFTING (CABG) TIMES FOUR UTILIZING LEFT INTERNAL MAMMARY ARTERY AND ENDOSCOPIC VEIN HARVEST RIGHT GREATER SAPHENOUS VEIN (N/A) TRANSESOPHAGEAL ECHOCARDIOGRAM (N/A) POD#7  1 afeb, S BP 116-175- mostly in good range, sinus rhythm/sinus brady, afib, short episodes, once 120's, cont amio and current beta blocket- likely not a good candidate for ACRX 2 O2 sats good on RA 3 Weight conts to trend lower, she appears euvolemic- will stop lasix, mild hyponatremia is stable, normal renal fxn 4 anemia is improving w/equilibration 5 SNF bed search in place 6 conts therapies and pulm hygiene 7 lovenox for DVT ppx    LOS: 9 days    Rowe Clack PA-C Pager [phone] 12/20/2022 Patient seen and examined, agree with findings and plan noted above  Viviann Spare  Lars Pinks, MD [facility] [phone]

## 2022-12-20 NOTE — NC FL2 (Signed)
[place] MEDICAID FL2 LEVEL OF CARE FORM     IDENTIFICATION  Patient Name: Christine [name] Birthdate: 06/26/1933 Sex: female Admission Date (Current Location): 12/11/2022  [facility] and [place] Number:  Producer, television/film/video and Address:  [facility], [address], [place], [place] [address]      Provider Number: [id number]  Attending Physician Name and Address:  Loreli Slot, MD  Relative Name and Phone Number:       Current Level of Care: Hospital Recommended Level of Care: Skilled Nursing Facility Prior Approval Number:    Date Approved/Denied:   PASRR Number: [id number]  Discharge Plan: SNF    Current Diagnoses: Patient Active Problem List   Diagnosis Date Noted   S/P CABG x 4 12/13/2022   Coronary artery disease involving native coronary artery of native heart with unstable angina pectoris (HCC) 12/11/2022   Frequent headaches 10/20/2022   Eustachian tube disorder, right 10/20/2022   Anxiety and depression 08/15/2022   Neck pain 05/12/2022   Sensation of fullness in left ear 05/12/2022   Impacted cerumen of left ear 05/12/2022   Chronic cough 04/06/2022   Other headache syndrome 04/06/2022   Bronchitis 04/06/2022   Thoracic spine pain 03/20/2022   Muscle tightness 03/20/2022   Acute post-traumatic headache, not intractable 01/06/2022   Rib pain on left side 12/26/2021   Cellulitis of left leg 12/13/2021   Hypokalemia 12/13/2021   Closed fracture of body of sternum 11/30/2021   Lightheadedness 11/30/2021   Daytime sleepiness 09/27/2021   Coronary artery disease of native artery of native heart with stable angina pectoris (HCC) 05/12/2020   Chest pain 04/02/2020   Current moderate episode of major depressive disorder without prior episode (HCC) 03/02/2020   Fatigue 02/15/2020   Mild intermittent asthma without complication 02/12/2020   DOE (dyspnea on exertion) 02/12/2020   Stenosis of carotid artery 06/12/2019    Osteoarthritis of knees, bilateral 06/03/2019   Chronic pain of left knee 12/10/2017   Essential hypertension 12/10/2017   Acquired hypothyroidism 12/10/2017   GERD without esophagitis 12/10/2017   Hyperlipidemia LDL goal <70 12/10/2017    Orientation RESPIRATION BLADDER Height & Weight     Self, Time, Situation, Place  Normal Continent Weight: 182 lb 11.2 oz (82.9 kg) Height:  5\' 5"  (165.1 cm)  BEHAVIORAL SYMPTOMS/MOOD NEUROLOGICAL BOWEL NUTRITION STATUS      Continent Diet (please see discharge summary)  AMBULATORY STATUS COMMUNICATION OF NEEDS Skin   Limited Assist Verbally Surgical wounds (closed incision chest, closed incsion Rt Leg)                       Personal Care Assistance Level of Assistance  Bathing, Feeding, Dressing Bathing Assistance: Limited assistance Feeding assistance: Independent Dressing Assistance: Limited assistance     Functional Limitations Info  Sight, Hearing, Speech Sight Info: Adequate Hearing Info: Adequate Speech Info: Adequate    SPECIAL CARE FACTORS FREQUENCY  PT (By licensed PT), OT (By licensed OT)     PT Frequency: 5x per week OT Frequency: 5x per week            Contractures Contractures Info: Not present    Additional Factors Info  Code Status, Allergies Code Status Info: FULL Allergies Info: Atorvastatin,Pollen Extract           Current Medications (12/20/2022):  This is the current hospital active medication list Current Facility-Administered Medications  Medication Dose Route Frequency Provider Last Rate Last Admin  albuterol (PROVENTIL) (2.5 MG/3ML) 0.083% nebulizer solution 2.5 mg  2.5 mg Nebulization Q6H PRN Loreli Slot, MD   2.5 mg at 12/18/22 0248   alum & mag hydroxide-simeth (MAALOX/MYLANTA) 200-200-20 MG/5ML suspension 15 mL  15 mL Oral Q6H PRN Loreli Slot, MD   15 mL at 12/19/22 2135   amiodarone (PACERONE) tablet 200 mg  200 mg Oral Daily Alleen Borne, MD   200 mg at 12/20/22  0845   aspirin EC tablet 81 mg  81 mg Oral Daily Loreli Slot, MD   81 mg at 12/20/22 0844   bisacodyl (DULCOLAX) EC tablet 10 mg  10 mg Oral Daily Loreli Slot, MD   10 mg at 12/20/22 0845   Or   bisacodyl (DULCOLAX) suppository 10 mg  10 mg Rectal Daily Loreli Slot, MD       [facility], Patient & Family Education   Does not apply Once Loreli Slot, MD       docusate sodium (COLACE) capsule 200 mg  200 mg Oral Daily Loreli Slot, MD   200 mg at 12/20/22 0845   enoxaparin (LOVENOX) injection 40 mg  40 mg Subcutaneous QHS Loreli Slot, MD   40 mg at 12/19/22 2135   feeding supplement (ENSURE ENLIVE / ENSURE PLUS) liquid 237 mL  237 mL Oral TID WC Loreli Slot, MD       fluticasone furoate-vilanterol (BREO ELLIPTA) 200-25 MCG/ACT 1 puff  1 puff Inhalation Daily PRN Loreli Slot, MD       lip balm (CARMEX) ointment   Topical PRN Loreli Slot, MD       loratadine (CLARITIN) tablet 10 mg  10 mg Oral Daily PRN Loreli Slot, MD       magnesium hydroxide (MILK OF MAGNESIA) suspension 30 mL  30 mL Oral Daily PRN Loreli Slot, MD       metoprolol succinate (TOPROL-XL) 24 hr tablet 12.5 mg  12.5 mg Oral Daily Loreli Slot, MD   12.5 mg at 12/20/22 0845   metoprolol tartrate (LOPRESSOR) injection 2.5-5 mg  2.5-5 mg Intravenous Q2H PRN Loreli Slot, MD       ondansetron [facility]) injection 4 mg  4 mg Intravenous Q6H PRN Loreli Slot, MD   4 mg at 12/18/22 1118   Oral care mouth rinse  15 mL Mouth Rinse PRN Loreli Slot, MD       oxyCODONE (Oxy IR/ROXICODONE) immediate release tablet 5-10 mg  5-10 mg Oral Q3H PRN Loreli Slot, MD   10 mg at 12/20/22 0844   pantoprazole (PROTONIX) EC tablet 40 mg  40 mg Oral Daily Loreli Slot, MD   40 mg at 12/20/22 0844   sodium chloride flush (NS) 0.9 % injection 3 mL  3 mL Intravenous Q12H Loreli Slot, MD       traMADol Janean Sark) tablet 50-100 mg  50-100 mg Oral Q4H PRN Loreli Slot, MD   100 mg at 12/20/22 0845     Discharge Medications: Please see discharge summary for a list of discharge medications.  Relevant Imaging Results:  Relevant Lab Results:   Additional Information SSN [id number]  Eduard Roux, LCSW

## 2022-12-20 NOTE — Plan of Care (Signed)
  Problem: Education: Goal: Understanding of CV disease, CV risk reduction, and recovery process will improve Outcome: Progressing

## 2022-12-20 NOTE — TOC Progression Note (Signed)
Transition of Care [facility]) - Progression Note    Patient Details  Name: Katja Blue MRN: [id number] Date of Birth: August 20, 1933  Transition of Care [facility]) CM/SW Contact  Inis Sizer, LCSW Phone Number: 12/20/2022, 1:43 PM  Clinical Narrative:    CSW spoke with patient to present her with bed offers. Patient states she wants to accept offer from [facility] in [place]. Patient states she will inform her daughter of the facility choice.  CSW notified French Ana at [employer] of bed acceptance.  No insurance authorization is required due to patient having traditional Medicare.   Expected Discharge Plan: Home w Home Health Services Barriers to Discharge: Continued Medical Work up  Expected Discharge Plan and Services   Discharge Planning Services: CM Consult Post Acute Care Choice: Home Health Living arrangements for the past 2 months: Single Family Home                   DME Agency: NA                   Social Determinants of Health (SDOH) Interventions SDOH Screenings   Food Insecurity: No Food Insecurity (12/11/2022)  Housing: Low Risk  (12/11/2022)  Transportation Needs: No Transportation Needs (12/11/2022)  Utilities: Not At Risk (12/11/2022)  Depression (PHQ2-9): Low Risk  (10/20/2022)  Recent Concern: Depression (PHQ2-9) - Medium Risk (08/15/2022)  Tobacco Use: Low Risk  (12/13/2022)    Readmission Risk Interventions     No data to display

## 2022-12-20 NOTE — Progress Notes (Signed)
Occupational Therapy Treatment Patient Details Name: Christine [name] MRN: [id number] DOB: May 25, 1933 Today's Date: 12/20/2022   History of present illness Pt is a 89 yo female who underwent CABGx4 on 10/2. PMH: CAD, carotid artery stenosis, HTN, HLD, rheumatic fever, asthma, hypothyroidism, and GERD   OT comments  Pt progressing toward established OT goals. Focus session on ADL and energy conservation. Pt with excellent active conversation during energy conservation education and reporting she believes these tips will be helpful for her. Pt donning socks EOB with CGA this session. Cues during all bed mobility, transfers, and repositioning to maintain sternal precautions. Patient will benefit from continued inpatient follow up therapy, <3 hours/day       If plan is discharge home, recommend the following:  A little help with walking and/or transfers;A little help with bathing/dressing/bathroom;Assistance with cooking/housework;Assist for transportation;Help with stairs or ramp for entrance   Equipment Recommendations  Other (comment) (RW)    Recommendations for Other Services      Precautions / Restrictions Precautions Precautions: Sternal Precaution Booklet Issued: Yes (comment) Precaution Comments: Pt able to generally recall precautions but beneifts from cues for implementation Restrictions Other Position/Activity Restrictions: sternal precautions       Mobility Bed Mobility Overal bed mobility: Needs Assistance Bed Mobility: Sidelying to Sit, Sit to Sidelying, Rolling Rolling: Contact guard assist Sidelying to sit: Contact guard assist, HOB elevated     Sit to sidelying: Contact guard assist General bed mobility comments: Cues for sternal precautions, but no physical assist needed    Transfers Overall transfer level: Needs assistance Equipment used: None Transfers: Sit to/from Stand Sit to Stand: Contact guard assist, Supervision           General transfer  comment: Up from EOb with CGA, continued use of momentum; cues initially to avoid pushing body up with arms     Balance Overall balance assessment: Needs assistance Sitting-balance support: Feet supported, No upper extremity supported Sitting balance-Leahy Scale: Good Sitting balance - Comments: sitting EOB   Standing balance support: Bilateral upper extremity supported, During functional activity Standing balance-Leahy Scale: Fair                             ADL either performed or assessed with clinical judgement   ADL Overall ADL's : Needs assistance/impaired                     Lower Body Dressing: Contact guard assist;Sitting/lateral leans;Minimal assistance Lower Body Dressing Details (indicate cue type and reason): for donning socks. Good technique, but Min cues to maintain sternal precautions Toilet Transfer: Contact guard assist;Stand-pivot Toilet Transfer Details (indicate cue type and reason): CGA for safety         Functional mobility during ADLs: Contact guard assist      Extremity/Trunk Assessment Upper Extremity Assessment Upper Extremity Assessment: Generalized weakness   Lower Extremity Assessment Lower Extremity Assessment: Generalized weakness        Vision   Vision Assessment?: No apparent visual deficits   Perception     Praxis      Cognition Arousal: Alert Behavior During Therapy: WFL for tasks assessed/performed Overall Cognitive Status: Within Functional Limits for tasks assessed                                 General Comments: min cues for implementation of sterbnal precautions during ADL  Exercises      Shoulder Instructions       General Comments      Pertinent Vitals/ Pain       Pain Assessment Pain Assessment: Faces Faces Pain Scale: Hurts a little bit Pain Location: chest incision Pain Intervention(s): Monitored during session  Home Living                                           Prior Functioning/Environment              Frequency  Min 1X/week        Progress Toward Goals  OT Goals(current goals can now be found in the care plan section)  Progress towards OT goals: Progressing toward goals  Acute Rehab OT Goals Patient Stated Goal: be able to care for myself OT Goal Formulation: With patient Time For Goal Achievement: 01/01/23 Potential to Achieve Goals: Fair ADL Goals Pt Will Perform Grooming: with supervision;standing Pt Will Perform Upper Body Dressing: with modified independence;sitting Pt Will Perform Lower Body Dressing: with supervision;sit to/from stand Pt Will Transfer to Toilet: with supervision;ambulating;regular height toilet Additional ADL Goal #1: Pt will identify and implement 2+energy conservation strategies for use in the home setting Additional ADL Goal #2: Pt will maintain sternal precautions during all ADL.  Plan      Co-evaluation                 AM-PAC OT "6 Clicks" Daily Activity     Outcome Measure   Help from another person eating meals?: None Help from another person taking care of personal grooming?: A Little Help from another person toileting, which includes using toliet, bedpan, or urinal?: A Little Help from another person bathing (including washing, rinsing, drying)?: A Little Help from another person to put on and taking off regular upper body clothing?: A Little Help from another person to put on and taking off regular lower body clothing?: A Lot 6 Click Score: 18    End of Session Equipment Utilized During Treatment: Gait belt  OT Visit Diagnosis: Unsteadiness on feet (R26.81);Muscle weakness (generalized) (M62.81)   Activity Tolerance Patient tolerated treatment well   Patient Left with call bell/phone within reach;in bed;with bed alarm set   Nurse Communication Mobility status        Time: 1610-9604 OT Time Calculation (min): 20 min  Charges: OT General  Charges $OT Visit: 1 Visit OT Treatments $Self Care/Home Management : 8-22 mins  Christine [name], OTR/L [facility] Acute Rehabilitation Office: [phone]   Christine [name] 12/20/2022, 8:36 AM

## 2022-12-20 NOTE — Progress Notes (Signed)
CARDIAC REHAB PHASE I   PRE:  Rate/Rhythm: 66 SR    BP: sitting 120/60    SpO2: 90 RA  MODE:  Ambulation: 230 ft   POST:  Rate/Rhythm: 82 SR    BP: sitting 136/63     SpO2: 90 RA, 97 RA  Pt moved out of bed, walked with contact guard. C/o lightheadedness and fatigue but overall tolerated well. To recliner, VSS. Encouraged IS, 1000 ml currently, and x2 more walks.  [id number]   Ethelda Chick BS, ACSM-CEP 12/20/2022 11:04 AM

## 2022-12-20 NOTE — Progress Notes (Signed)
Mobility Specialist Progress Note:   12/20/22 1254  Mobility  Activity Ambulated with assistance in hallway  Level of Assistance Contact guard assist, steadying assist  Assistive Device Four wheel walker  Distance Ambulated (ft) 225 ft  RUE Weight Bearing NWB  LUE Weight Bearing NWB  Activity Response Tolerated well  Mobility Referral Yes  $Mobility charge 1 Mobility  Mobility Specialist Start Time (ACUTE ONLY) 1238  Mobility Specialist Stop Time (ACUTE ONLY) 1250  Mobility Specialist Time Calculation (min) (ACUTE ONLY) 12 min   Pre Mobility: 73 HR ,  95% SpO2 During Mobility: 81 HR  Post Mobility: 70 HR , 93% SpO2  Pt received in chair, agreeable to mobility. C/o slight SOB during ambulation, otherwise asymptomatic throughout. Pt left in bed with call bell in reach and all needs met.  Leory Plowman  Mobility Specialist Please contact via [employer] office at [phone]

## 2022-12-21 DIAGNOSIS — I10 Essential (primary) hypertension: Secondary | ICD-10-CM | POA: Diagnosis not present

## 2022-12-21 DIAGNOSIS — I2510 Atherosclerotic heart disease of native coronary artery without angina pectoris: Secondary | ICD-10-CM | POA: Diagnosis not present

## 2022-12-21 DIAGNOSIS — Z7982 Long term (current) use of aspirin: Secondary | ICD-10-CM | POA: Diagnosis not present

## 2022-12-21 DIAGNOSIS — Z743 Need for continuous supervision: Secondary | ICD-10-CM | POA: Diagnosis not present

## 2022-12-21 DIAGNOSIS — E039 Hypothyroidism, unspecified: Secondary | ICD-10-CM | POA: Diagnosis not present

## 2022-12-21 DIAGNOSIS — F32A Depression, unspecified: Secondary | ICD-10-CM | POA: Diagnosis not present

## 2022-12-21 DIAGNOSIS — E663 Overweight: Secondary | ICD-10-CM | POA: Diagnosis not present

## 2022-12-21 DIAGNOSIS — I200 Unstable angina: Secondary | ICD-10-CM | POA: Diagnosis not present

## 2022-12-21 DIAGNOSIS — E46 Unspecified protein-calorie malnutrition: Secondary | ICD-10-CM | POA: Diagnosis not present

## 2022-12-21 DIAGNOSIS — I358 Other nonrheumatic aortic valve disorders: Secondary | ICD-10-CM | POA: Diagnosis not present

## 2022-12-21 DIAGNOSIS — I6523 Occlusion and stenosis of bilateral carotid arteries: Secondary | ICD-10-CM | POA: Diagnosis not present

## 2022-12-21 DIAGNOSIS — M542 Cervicalgia: Secondary | ICD-10-CM | POA: Diagnosis not present

## 2022-12-21 DIAGNOSIS — I25110 Atherosclerotic heart disease of native coronary artery with unstable angina pectoris: Secondary | ICD-10-CM | POA: Diagnosis not present

## 2022-12-21 DIAGNOSIS — K219 Gastro-esophageal reflux disease without esophagitis: Secondary | ICD-10-CM | POA: Diagnosis not present

## 2022-12-21 DIAGNOSIS — K5900 Constipation, unspecified: Secondary | ICD-10-CM | POA: Diagnosis not present

## 2022-12-21 DIAGNOSIS — I499 Cardiac arrhythmia, unspecified: Secondary | ICD-10-CM | POA: Diagnosis not present

## 2022-12-21 DIAGNOSIS — E785 Hyperlipidemia, unspecified: Secondary | ICD-10-CM | POA: Diagnosis not present

## 2022-12-21 DIAGNOSIS — T7840XD Allergy, unspecified, subsequent encounter: Secondary | ICD-10-CM | POA: Diagnosis not present

## 2022-12-21 DIAGNOSIS — R053 Chronic cough: Secondary | ICD-10-CM | POA: Diagnosis not present

## 2022-12-21 DIAGNOSIS — M170 Bilateral primary osteoarthritis of knee: Secondary | ICD-10-CM | POA: Diagnosis not present

## 2022-12-21 DIAGNOSIS — R0602 Shortness of breath: Secondary | ICD-10-CM | POA: Diagnosis not present

## 2022-12-21 DIAGNOSIS — I201 Angina pectoris with documented spasm: Secondary | ICD-10-CM | POA: Diagnosis not present

## 2022-12-21 DIAGNOSIS — R519 Headache, unspecified: Secondary | ICD-10-CM | POA: Diagnosis not present

## 2022-12-21 DIAGNOSIS — J4520 Mild intermittent asthma, uncomplicated: Secondary | ICD-10-CM | POA: Diagnosis not present

## 2022-12-21 DIAGNOSIS — Z4889 Encounter for other specified surgical aftercare: Secondary | ICD-10-CM | POA: Diagnosis not present

## 2022-12-21 DIAGNOSIS — Z951 Presence of aortocoronary bypass graft: Secondary | ICD-10-CM | POA: Diagnosis not present

## 2022-12-21 DIAGNOSIS — F419 Anxiety disorder, unspecified: Secondary | ICD-10-CM | POA: Diagnosis not present

## 2022-12-21 DIAGNOSIS — H6991 Unspecified Eustachian tube disorder, right ear: Secondary | ICD-10-CM | POA: Diagnosis not present

## 2022-12-21 LAB — CREATININE, SERUM
Creatinine, Ser: 0.89 mg/dL (ref 0.44–1.00)
GFR, Estimated: >60 mL/min (ref >60)

## 2022-12-21 LAB — GLUCOSE, CAPILLARY: Glucose-Capillary: 101 mg/dL — ABNORMAL HIGH (ref 70–99)

## 2022-12-21 MED ORDER — METOPROLOL SUCCINATE ER 25 MG PO TB24: 12.5000 mg | ORAL_TABLET | Freq: Every day | ORAL | Status: DC

## 2022-12-21 MED ORDER — ENSURE ENLIVE PO LIQD: 237.0000 mL | Freq: Three times a day (TID) | ORAL | Status: DC

## 2022-12-21 MED ORDER — TRAMADOL HCL 50 MG PO TABS: 50.0000 mg | ORAL_TABLET | Freq: Four times a day (QID) | ORAL | 0 refills | Status: DC | PRN

## 2022-12-21 MED ORDER — AMIODARONE HCL 200 MG PO TABS: 200.0000 mg | ORAL_TABLET | Freq: Every day | ORAL | Status: DC

## 2022-12-21 MED FILL — Lidocaine HCl Local Soln Prefilled Syringe 100 MG/5ML (2%): INTRAMUSCULAR | Qty: 5 | Status: AC

## 2022-12-21 MED FILL — Sodium Chloride IV Soln 0.9%: INTRAVENOUS | Qty: 1000 | Status: AC

## 2022-12-21 MED FILL — Heparin Sodium (Porcine) Inj 1000 Unit/ML: INTRAMUSCULAR | Qty: 10 | Status: AC

## 2022-12-21 MED FILL — Mannitol IV Soln 20%: INTRAVENOUS | Qty: 500 | Status: AC

## 2022-12-21 MED FILL — Electrolyte-R (PH 7.4) Solution: INTRAVENOUS | Qty: 4000 | Status: AC

## 2022-12-21 MED FILL — Sodium Bicarbonate IV Soln 8.4%: INTRAVENOUS | Qty: 50 | Status: AC

## 2022-12-21 NOTE — Plan of Care (Signed)
  Problem: Education: Goal: Understanding of CV disease, CV risk reduction, and recovery process will improve Outcome: Progressing Goal: Individualized Educational Video(s) Outcome: Progressing   Problem: Activity: Goal: Ability to return to baseline activity level will improve Outcome: Progressing   Problem: Cardiovascular: Goal: Ability to achieve and maintain adequate cardiovascular perfusion will improve Outcome: Progressing Goal: Vascular access site(s) Level 0-1 will be maintained Outcome: Progressing   Problem: Health Behavior/Discharge Planning: Goal: Ability to safely manage health-related needs after discharge will improve Outcome: Progressing   Problem: Education: Goal: Knowledge of General Education information will improve Description: Including pain rating scale, medication(s)/side effects and non-pharmacologic comfort measures Outcome: Progressing   Problem: Health Behavior/Discharge Planning: Goal: Ability to manage health-related needs will improve Outcome: Progressing   Problem: Clinical Measurements: Goal: Ability to maintain clinical measurements within normal limits will improve Outcome: Progressing Goal: Will remain free from infection Outcome: Progressing Goal: Diagnostic test results will improve Outcome: Progressing Goal: Respiratory complications will improve Outcome: Progressing Goal: Cardiovascular complication will be avoided Outcome: Progressing   Problem: Activity: Goal: Risk for activity intolerance will decrease Outcome: Progressing   Problem: Nutrition: Goal: Adequate nutrition will be maintained Outcome: Progressing   Problem: Coping: Goal: Level of anxiety will decrease Outcome: Progressing   Problem: Elimination: Goal: Will not experience complications related to bowel motility Outcome: Progressing Goal: Will not experience complications related to urinary retention Outcome: Progressing   Problem: Pain Managment: Goal:  General experience of comfort will improve Outcome: Progressing   Problem: Safety: Goal: Ability to remain free from injury will improve Outcome: Progressing   Problem: Skin Integrity: Goal: Risk for impaired skin integrity will decrease Outcome: Progressing   Problem: Education: Goal: Will demonstrate proper wound care and an understanding of methods to prevent future damage Outcome: Progressing Goal: Knowledge of disease or condition will improve Outcome: Progressing Goal: Knowledge of the prescribed therapeutic regimen will improve Outcome: Progressing Goal: Individualized Educational Video(s) Outcome: Progressing   Problem: Activity: Goal: Risk for activity intolerance will decrease Outcome: Progressing   Problem: Cardiac: Goal: Will achieve and/or maintain hemodynamic stability Outcome: Progressing   Problem: Clinical Measurements: Goal: Postoperative complications will be avoided or minimized Outcome: Progressing   Problem: Respiratory: Goal: Respiratory status will improve Outcome: Progressing   Problem: Skin Integrity: Goal: Wound healing without signs and symptoms of infection Outcome: Progressing Goal: Risk for impaired skin integrity will decrease Outcome: Progressing   Problem: Urinary Elimination: Goal: Ability to achieve and maintain adequate renal perfusion and functioning will improve Outcome: Progressing   Problem: Education: Goal: Ability to describe self-care measures that may prevent or decrease complications (Diabetes Survival Skills Education) will improve Outcome: Progressing Goal: Individualized Educational Video(s) Outcome: Progressing   Problem: Coping: Goal: Ability to adjust to condition or change in health will improve Outcome: Progressing   Problem: Fluid Volume: Goal: Ability to maintain a balanced intake and output will improve Outcome: Progressing   Problem: Health Behavior/Discharge Planning: Goal: Ability to identify and  utilize available resources and services will improve Outcome: Progressing Goal: Ability to manage health-related needs will improve Outcome: Progressing   Problem: Metabolic: Goal: Ability to maintain appropriate glucose levels will improve Outcome: Progressing   Problem: Nutritional: Goal: Maintenance of adequate nutrition will improve Outcome: Progressing Goal: Progress toward achieving an optimal weight will improve Outcome: Progressing   Problem: Skin Integrity: Goal: Risk for impaired skin integrity will decrease Outcome: Progressing   Problem: Tissue Perfusion: Goal: Adequacy of tissue perfusion will improve Outcome: Progressing   

## 2022-12-21 NOTE — Progress Notes (Signed)
Mobility Specialist Progress Note:  Pt refused mobility d/t fatigue. Will f/u as able.    Leory Plowman  Mobility Specialist Please contact via [employer] office at [phone]

## 2022-12-21 NOTE — Progress Notes (Signed)
Report given to Mia @ [facility]. [phone]

## 2022-12-21 NOTE — Progress Notes (Addendum)
Pt received OHS book and education on restrictions, heart healthy diet, ex guidelines, Move in the Tube sheet, incentive spirometer use when d/c and CRPII. Pt denies questions and was encouraged to look in the book for additional information. Referral placed to [facility].   Pt ready for SNF, looking forward to CR in the future. Will be referred to [facility]  Referral placed to: [facility] under cardiologist Dr. Cristal Deer End  Qualifying Dx: Mliss Sax  Faustino Congress 12/21/2022 8:55 AM  [phone]

## 2022-12-21 NOTE — Progress Notes (Signed)
8 Days Post-Op Procedure(s) (LRB): CORONARY ARTERY BYPASS GRAFTING (CABG) TIMES FOUR UTILIZING LEFT INTERNAL MAMMARY ARTERY AND ENDOSCOPIC VEIN HARVEST RIGHT GREATER SAPHENOUS VEIN (N/A) TRANSESOPHAGEAL ECHOCARDIOGRAM (N/A) Subjective: Conts to improve  Objective: Vital signs in last 24 hours: Temp:  [97.6 F (36.4 C)-98.1 F (36.7 C)] 97.9 F (36.6 C) (10/10 0345) Pulse Rate:  [58-99] 68 (10/10 0345) Cardiac Rhythm: Atrial flutter (10/09 2158) Resp:  [17-20] 20 (10/10 0345) BP: (131-156)/(58-96) 152/69 (10/10 0345) SpO2:  [93 %-100 %] 93 % (10/10 0345) Weight:  [81.7 kg] 81.7 kg (10/10 0342)  Hemodynamic parameters for last 24 hours:    Intake/Output from previous day: 10/09 0701 - 10/10 0700 In: 366 [P.O.:360; I.V.:6] Out: 0  Intake/Output this shift: No intake/output data recorded.  General appearance: alert, cooperative, and no distress Heart: regular rate and rhythm Lungs: clear to auscultation bilaterally Abdomen: benign Extremities: no edema Wound: incis healing well  Lab Results: Recent Labs    12/19/22 0734  WBC 7.1  HGB 9.3*  HCT 27.6*  PLT 253   BMET:  Recent Labs    12/19/22 0734  NA 128*  K 3.5  CL 88*  CO2 30  GLUCOSE 95  BUN 11  CREATININE 0.84  CALCIUM 8.8*    PT/INR: No results for input(s): "LABPROT", "INR" in the last 72 hours. ABG    Component Value Date/Time   PHART 7.332 (L) 12/13/2022 1905   HCO3 23.2 12/13/2022 1905   TCO2 25 12/13/2022 1905   ACIDBASEDEF 3.0 (H) 12/13/2022 1905   O2SAT 94 12/13/2022 1905   CBG (last 3)  Recent Labs    12/20/22 1616 12/20/22 2159 12/21/22 0620  GLUCAP 107* 107* 101*    Meds Scheduled Meds:  amiodarone  200 mg Oral Daily   aspirin EC  81 mg Oral Daily   bisacodyl  10 mg Oral Daily   Or   bisacodyl  10 mg Rectal Daily   [place] [facility], Patient & Family Education   Does not apply Once   docusate sodium  200 mg Oral Daily   enoxaparin (LOVENOX) injection  40 mg  Subcutaneous QHS   feeding supplement  237 mL Oral TID WC   metoprolol succinate  12.5 mg Oral Daily   pantoprazole  40 mg Oral Daily   sodium chloride flush  3 mL Intravenous Q12H   Continuous Infusions: PRN Meds:.albuterol, alum & mag hydroxide-simeth, fluticasone furoate-vilanterol, lip balm, loratadine, magnesium hydroxide, metoprolol tartrate, ondansetron (ZOFRAN) IV, mouth rinse, oxyCODONE, traMADol  Xrays No results found.  Assessment/Plan: S/P Procedure(s) (LRB): CORONARY ARTERY BYPASS GRAFTING (CABG) TIMES FOUR UTILIZING LEFT INTERNAL MAMMARY ARTERY AND ENDOSCOPIC VEIN HARVEST RIGHT GREATER SAPHENOUS VEIN (N/A) TRANSESOPHAGEAL ECHOCARDIOGRAM (N/A) POD#8  1 afeb, S BP 130's-150's, HR 50's-90's, aflutter, mostly sinus - on beta blocker and amio- cont same , not a good candidate for ACRX- will restart ARB for BP 2 O2 sats good on RA 3 Voiding-not measured, weight trending lower, off diuretics 4 no new labs or xrays 5 appears ready for SNF today    LOS: 10 days    Rowe Clack PA-C Pager [phone] 12/21/2022

## 2022-12-21 NOTE — TOC Transition Note (Signed)
Transition of Care [facility]) - CM/SW Discharge Note   Patient Details  Name: Christine [name] MRN: [id number] Date of Birth: 1933/05/16  Transition of Care [facility]) CM/SW Contact:  Eduard Roux, LCSW Phone Number: 12/21/2022, 12:13 PM   Clinical Narrative:     Patient will Discharge to: [facility] Discharge Date: 12/21/2022 Family Notified: Transport IO:NGEX  Per MD patient is ready for discharge. RN, patient, and facility notified of discharge. Discharge Summary sent to facility. RN given number for [phone]. Ambulance transport requested for patient.   Clinical Social Worker signing off.  Antony Blackbird, MSW, LCSW Clinical Social Worker       Barriers to Discharge: Continued Medical Work up   Patient Goals and CMS Choice      Discharge Placement                         Discharge Plan and Services Additional resources added to the After Visit Summary for     Discharge Planning Services: CM Consult Post Acute Care Choice: Home Health            DME Agency: NA                  Social Determinants of Health (SDOH) Interventions SDOH Screenings   Food Insecurity: No Food Insecurity (12/11/2022)  Housing: Low Risk  (12/11/2022)  Transportation Needs: No Transportation Needs (12/11/2022)  Utilities: Not At Risk (12/11/2022)  Depression (PHQ2-9): Low Risk  (10/20/2022)  Recent Concern: Depression (PHQ2-9) - Medium Risk (08/15/2022)  Tobacco Use: Low Risk  (12/13/2022)     Readmission Risk Interventions     No data to display

## 2022-12-23 DIAGNOSIS — I2510 Atherosclerotic heart disease of native coronary artery without angina pectoris: Secondary | ICD-10-CM | POA: Diagnosis not present

## 2022-12-23 DIAGNOSIS — E46 Unspecified protein-calorie malnutrition: Secondary | ICD-10-CM | POA: Diagnosis not present

## 2022-12-23 DIAGNOSIS — E785 Hyperlipidemia, unspecified: Secondary | ICD-10-CM | POA: Diagnosis not present

## 2022-12-23 DIAGNOSIS — E039 Hypothyroidism, unspecified: Secondary | ICD-10-CM | POA: Diagnosis not present

## 2022-12-23 DIAGNOSIS — J4520 Mild intermittent asthma, uncomplicated: Secondary | ICD-10-CM | POA: Diagnosis not present

## 2022-12-23 DIAGNOSIS — Z951 Presence of aortocoronary bypass graft: Secondary | ICD-10-CM | POA: Diagnosis not present

## 2022-12-23 DIAGNOSIS — I10 Essential (primary) hypertension: Secondary | ICD-10-CM | POA: Diagnosis not present

## 2022-12-23 DIAGNOSIS — K5900 Constipation, unspecified: Secondary | ICD-10-CM | POA: Diagnosis not present

## 2022-12-25 ENCOUNTER — Ambulatory Visit: Payer: Self-pay | Admitting: *Deleted

## 2022-12-25 DIAGNOSIS — Z4801 Encounter for change or removal of surgical wound dressing: Secondary | ICD-10-CM

## 2022-12-25 DIAGNOSIS — Z4802 Encounter for removal of sutures: Secondary | ICD-10-CM

## 2022-12-25 NOTE — Progress Notes (Signed)
Patient arrived for nurse visit to remove sutures post-CABG 10/2 by Dr. Lillymae Fetch.  Two sutures removed with no signs or symptoms of infection noted.  Incisions well approximated. Patient tolerated suture removal well.  Patient instructed to keep the incision site clean and dry. Patient acknowledged instructions given.  All questions answered.

## 2022-12-26 ENCOUNTER — Telehealth (HOSPITAL_COMMUNITY): Payer: Self-pay

## 2022-12-26 NOTE — Telephone Encounter (Signed)
Pt insurance is active and benefits verified through Medicare A/B. Co-pay $0.00, DED $240.00/$240.00 met, out of pocket $0.00/$0.00 met, co-insurance 20%. No pre-authorization required. Passport, 12/26/22 @ 2:10PM, REF#[id number]   How many CR sessions are covered? (36 visits for TCR, 72 visits for ICR)72 Is this a lifetime maximum or an annual maximum? Lifetime Has the member used any of these services to date? No Is there a time limit (weeks/months) on start of program and/or program completion? No     Will contact patient to see if she is interested in the Cardiac Rehab Program. If interested, patient will need to complete follow up appt. Once completed, patient will be contacted for scheduling upon review by the RN Navigator.

## 2022-12-28 ENCOUNTER — Ambulatory Visit: Payer: Medicare Other | Attending: Internal Medicine | Admitting: Internal Medicine

## 2022-12-28 NOTE — Progress Notes (Deleted)
Cardiology Office Note:  .   Date:  12/28/2022  ID:  Christine [name], DOB 07-11-1933, MRN [id number] PCP: Eden Emms, NP  [facility] Providers Cardiologist:  Yvonne Kendall, MD { Click to update primary MD,subspecialty MD or APP then REFRESH:1}    History of Present Illness: .   Christine [name] is a 89 y.o. female with history of coronary artery disease is post recent CABG (LIMA-LAD, sequential SVG-OM1-OM2, and SVG-distal RCA), carotid artery stenosis, hypertension, hyperlipidemia, rheumatic fever, asthma, hypothyroidism, and GERD who presents for follow-up of coronary artery disease.  I last saw her in late September for review of her cardiac CTA that showed multivessel coronary artery disease.  She subsequently underwent catheterization that confirmed severe multivessel CAD.  She was admitted to [facility] and underwent CABG with Dr. Rhyann Fetch on 12/13/2022.  Postoperative course was complicated by paroxysmal atrial fibrillation/flutter.  She was not started on anticoagulation due to concerns about acute blood loss anemia.  She was discharged on 12/21/2022.  ROS: See HPI  Studies Reviewed: Marland Kitchen        TTE (12/12/2022):  1. Left ventricular ejection fraction, by estimation, is 60 to 65%. The  left ventricle has normal function. The left ventricle has no regional  wall motion abnormalities. Left ventricular diastolic parameters are  consistent with Grade I diastolic  dysfunction (impaired relaxation).   2. Right ventricular systolic function is normal. The right ventricular  size is normal. Tricuspid regurgitation signal is inadequate for assessing  PA pressure.   3. The mitral valve is normal in structure. No evidence of mitral valve  regurgitation. No evidence of mitral stenosis.   4. The aortic valve is tricuspid. Aortic valve regurgitation is not  visualized. No aortic stenosis is present.   5. The inferior vena cava is normal in size with greater  than 50%  respiratory variability, suggesting right atrial pressure of 3 mmHg.   LHC (12/11/2022): Significant multivessel coronary artery disease, including sequential 60% ostial and 50% mid LAD stenoses, complex ostial LCx stenosis involving trifurcation of OM1, OM2, and LCx proper with hazy 90% stenosis, and diffuse calcified RCA disease of up to 70%. Normal left ventricular systolic function (LVEF 55-65%) and filling pressure (LVEDP 10 mmHg). Risk Assessment/Calculations:   {Does this patient have ATRIAL FIBRILLATION?:[id number]} No BP recorded.  {Refresh Note OR Click here to enter BP  :1}***       Physical Exam:   VS:  There were no vitals taken for this visit.   Wt Readings from Last 3 Encounters:  12/21/22 180 lb 3.2 oz (81.7 kg)  12/06/22 173 lb (78.5 kg)  11/17/22 178 lb (80.7 kg)    General:  NAD. Neck: No JVD or HJR. Lungs: Clear to auscultation bilaterally without wheezes or crackles. Heart: Regular rate and rhythm without murmurs, rubs, or gallops. Abdomen: Soft, nontender, nondistended. Extremities: No lower extremity edema.  ASSESSMENT AND PLAN: .    *** {The patient has an active order for outpatient cardiac rehabilitation.   Please indicate if the patient is ready to start. Do NOT delete this.  It will auto delete.  Refresh note, then sign.              Click here to document readiness and see contraindications.  :1}  Cardiac Rehabilitation Eligibility Assessment      {Are you ordering a CV Procedure (e.g. stress test, cath, DCCV, TEE, etc)?   Press F2        :[id number]}  Dispo: ***  Signed, Yvonne Kendall, MD

## 2022-12-29 ENCOUNTER — Encounter: Payer: Self-pay | Admitting: Internal Medicine

## 2023-01-06 DIAGNOSIS — Z48812 Encounter for surgical aftercare following surgery on the circulatory system: Secondary | ICD-10-CM | POA: Diagnosis not present

## 2023-01-06 DIAGNOSIS — J4520 Mild intermittent asthma, uncomplicated: Secondary | ICD-10-CM | POA: Diagnosis not present

## 2023-01-06 DIAGNOSIS — Z9013 Acquired absence of bilateral breasts and nipples: Secondary | ICD-10-CM | POA: Diagnosis not present

## 2023-01-06 DIAGNOSIS — R1310 Dysphagia, unspecified: Secondary | ICD-10-CM | POA: Diagnosis not present

## 2023-01-06 DIAGNOSIS — E039 Hypothyroidism, unspecified: Secondary | ICD-10-CM | POA: Diagnosis not present

## 2023-01-06 DIAGNOSIS — G8929 Other chronic pain: Secondary | ICD-10-CM | POA: Diagnosis not present

## 2023-01-06 DIAGNOSIS — G4489 Other headache syndrome: Secondary | ICD-10-CM | POA: Diagnosis not present

## 2023-01-06 DIAGNOSIS — I088 Other rheumatic multiple valve diseases: Secondary | ICD-10-CM | POA: Diagnosis not present

## 2023-01-06 DIAGNOSIS — I10 Essential (primary) hypertension: Secondary | ICD-10-CM | POA: Diagnosis not present

## 2023-01-06 DIAGNOSIS — K219 Gastro-esophageal reflux disease without esophagitis: Secondary | ICD-10-CM | POA: Diagnosis not present

## 2023-01-06 DIAGNOSIS — E46 Unspecified protein-calorie malnutrition: Secondary | ICD-10-CM | POA: Diagnosis not present

## 2023-01-06 DIAGNOSIS — J9811 Atelectasis: Secondary | ICD-10-CM | POA: Diagnosis not present

## 2023-01-06 DIAGNOSIS — Z7982 Long term (current) use of aspirin: Secondary | ICD-10-CM | POA: Diagnosis not present

## 2023-01-06 DIAGNOSIS — I6523 Occlusion and stenosis of bilateral carotid arteries: Secondary | ICD-10-CM | POA: Diagnosis not present

## 2023-01-06 DIAGNOSIS — F419 Anxiety disorder, unspecified: Secondary | ICD-10-CM | POA: Diagnosis not present

## 2023-01-06 DIAGNOSIS — I25110 Atherosclerotic heart disease of native coronary artery with unstable angina pectoris: Secondary | ICD-10-CM | POA: Diagnosis not present

## 2023-01-06 DIAGNOSIS — M170 Bilateral primary osteoarthritis of knee: Secondary | ICD-10-CM | POA: Diagnosis not present

## 2023-01-06 DIAGNOSIS — Z951 Presence of aortocoronary bypass graft: Secondary | ICD-10-CM | POA: Diagnosis not present

## 2023-01-06 DIAGNOSIS — Z9181 History of falling: Secondary | ICD-10-CM | POA: Diagnosis not present

## 2023-01-06 DIAGNOSIS — Z7951 Long term (current) use of inhaled steroids: Secondary | ICD-10-CM | POA: Diagnosis not present

## 2023-01-06 DIAGNOSIS — F324 Major depressive disorder, single episode, in partial remission: Secondary | ICD-10-CM | POA: Diagnosis not present

## 2023-01-06 DIAGNOSIS — E785 Hyperlipidemia, unspecified: Secondary | ICD-10-CM | POA: Diagnosis not present

## 2023-01-06 DIAGNOSIS — I4891 Unspecified atrial fibrillation: Secondary | ICD-10-CM | POA: Diagnosis not present

## 2023-01-08 ENCOUNTER — Telehealth: Payer: Self-pay | Admitting: Nurse Practitioner

## 2023-01-08 DIAGNOSIS — Z48812 Encounter for surgical aftercare following surgery on the circulatory system: Secondary | ICD-10-CM | POA: Diagnosis not present

## 2023-01-08 DIAGNOSIS — E46 Unspecified protein-calorie malnutrition: Secondary | ICD-10-CM | POA: Diagnosis not present

## 2023-01-08 DIAGNOSIS — I088 Other rheumatic multiple valve diseases: Secondary | ICD-10-CM | POA: Diagnosis not present

## 2023-01-08 DIAGNOSIS — J4520 Mild intermittent asthma, uncomplicated: Secondary | ICD-10-CM | POA: Diagnosis not present

## 2023-01-08 DIAGNOSIS — I25110 Atherosclerotic heart disease of native coronary artery with unstable angina pectoris: Secondary | ICD-10-CM | POA: Diagnosis not present

## 2023-01-08 DIAGNOSIS — I10 Essential (primary) hypertension: Secondary | ICD-10-CM | POA: Diagnosis not present

## 2023-01-08 NOTE — Telephone Encounter (Signed)
Verbal orders approved.

## 2023-01-08 NOTE — Telephone Encounter (Signed)
Home Health verbal orders Caller Name: Eileen Stanford Agency Name: [facility] number: [phone]  Requesting PT  Reason: quadruple cabg  Frequency: 2x a week for 2 weeks, 1x a week for 4 weeks  Please forward to [facility] pool or providers CMA

## 2023-01-09 ENCOUNTER — Encounter: Payer: Self-pay | Admitting: Nurse Practitioner

## 2023-01-09 ENCOUNTER — Ambulatory Visit (INDEPENDENT_AMBULATORY_CARE_PROVIDER_SITE_OTHER)
Admission: RE | Admit: 2023-01-09 | Discharge: 2023-01-09 | Disposition: A | Discharge status: Home / Self Care | Payer: Medicare Other | Source: Ambulatory Visit | Attending: Nurse Practitioner

## 2023-01-09 ENCOUNTER — Telehealth: Payer: Self-pay | Admitting: Internal Medicine

## 2023-01-09 ENCOUNTER — Ambulatory Visit (INDEPENDENT_AMBULATORY_CARE_PROVIDER_SITE_OTHER): Payer: Medicare Other | Admitting: Nurse Practitioner

## 2023-01-09 VITALS — BP 122/76 | HR 64 | Temp 98.1°F | Ht 65.0 in | Wt 166.6 lb

## 2023-01-09 DIAGNOSIS — R0602 Shortness of breath: Secondary | ICD-10-CM | POA: Diagnosis not present

## 2023-01-09 DIAGNOSIS — Z951 Presence of aortocoronary bypass graft: Secondary | ICD-10-CM

## 2023-01-09 DIAGNOSIS — Z09 Encounter for follow-up examination after completed treatment for conditions other than malignant neoplasm: Secondary | ICD-10-CM

## 2023-01-09 DIAGNOSIS — R918 Other nonspecific abnormal finding of lung field: Secondary | ICD-10-CM | POA: Diagnosis not present

## 2023-01-09 DIAGNOSIS — R059 Cough, unspecified: Secondary | ICD-10-CM | POA: Diagnosis not present

## 2023-01-09 DIAGNOSIS — J90 Pleural effusion, not elsewhere classified: Secondary | ICD-10-CM | POA: Diagnosis not present

## 2023-01-09 DIAGNOSIS — K219 Gastro-esophageal reflux disease without esophagitis: Secondary | ICD-10-CM

## 2023-01-09 DIAGNOSIS — R5383 Other fatigue: Secondary | ICD-10-CM

## 2023-01-09 LAB — CBC
HCT: 41.1 % (ref 36.0–46.0)
Hemoglobin: 13.1 g/dL (ref 12.0–15.0)
MCHC: 31.8 g/dL (ref 30.0–36.0)
MCV: 87.9 fL (ref 78.0–100.0)
Platelets: 223 10*3/uL (ref 150.0–400.0)
RBC: 4.68 Mil/uL (ref 3.87–5.11)
RDW: 14.6 % (ref 11.5–15.5)
WBC: 5.3 10*3/uL (ref 4.0–10.5)

## 2023-01-09 LAB — BASIC METABOLIC PANEL
BUN: 8 mg/dL (ref 6–23)
CO2: 27 meq/L (ref 19–32)
Calcium: 9.8 mg/dL (ref 8.4–10.5)
Chloride: 102 meq/L (ref 96–112)
Creatinine, Ser: 0.95 mg/dL (ref 0.40–1.20)
GFR: 53.82 mL/min — ABNORMAL LOW (ref >60.00)
Glucose, Bld: 111 mg/dL — ABNORMAL HIGH (ref 70–99)
Potassium: 4 meq/L (ref 3.5–5.1)
Sodium: 138 meq/L (ref 135–145)

## 2023-01-09 MED ORDER — PANTOPRAZOLE SODIUM 40 MG PO TBEC
40.0000 mg | DELAYED_RELEASE_TABLET | Freq: Every day | ORAL | 0 refills | Status: DC
Start: 2023-01-09 — End: 2023-02-20

## 2023-01-09 NOTE — Progress Notes (Addendum)
Established Patient Office Visit  Subjective   Patient ID: Christine [name], female    DOB: [birth year]  Age: 89 y.o. MRN: [id number]  Chief Complaint  Patient presents with   Hospitaliztion follow up    Pt complains of having no energy and feeling tired since surgery.     HPI  Hospital follow up: Patient was seen in cardiologist office at 12/06/2022.  Where they proceeded to do a cardiac catheterization with possible PCI.  Patient was converted to a quadruple bypass done by cardiothoracic surgery.Patient was discharged on 12/21/2022.  [facility].  Patient is here for follow-up  Was discharged on 12/21/2022 and released from [facility] on 01/05/2023.  States that she does have a f/u with CT on 01/16/2023. States that she will follow up with the caridologist.  States that she has been having reflux that is not controlled on her aciphex. States that they told her this is a normal finding post surgery   She is doing at home PT and she did it at [employer] also. At home is twice a week  States that she is experience some shortness of breath. States that it is worse since surgery. States that she is using her incentive spriometrty and is doing well with that.     Review of Systems  Constitutional:  Positive for malaise/fatigue. Negative for chills and fever.  Respiratory:  Positive for cough and shortness of breath.   Cardiovascular:  Negative for chest pain.  Gastrointestinal:  Negative for abdominal pain, constipation, nausea and vomiting.  Neurological:  Negative for headaches.      Objective:     BP 122/76   Pulse 64   Temp 98.1 F (36.7 C) (Oral)   Ht 5\' 5"  (1.651 m)   Wt 166 lb 9.6 oz (75.6 kg)   SpO2 98%   BMI 27.72 kg/m    Physical Exam Vitals and nursing note reviewed.  Constitutional:      Appearance: Normal appearance.  Cardiovascular:     Rate and Rhythm: Normal rate and regular rhythm.     Pulses:          Posterior tibial  pulses are 2+ on the right side and 2+ on the left side.     Heart sounds: Normal heart sounds.  Pulmonary:     Effort: Pulmonary effort is normal.     Breath sounds: Normal breath sounds.  Abdominal:     General: Bowel sounds are normal.     Tenderness: There is no abdominal tenderness.  Musculoskeletal:     Right lower leg: No edema.     Left lower leg: No edema.  Neurological:     Mental Status: She is alert.      No results found for any visits on 01/09/23.    The ASCVD Risk score (Arnett DK, et al., 2019) failed to calculate for the following reasons:   The 2019 ASCVD risk score is only valid for ages 21 to 1    Assessment & Plan:   Problem List Items Addressed This Visit       Digestive   Gastroesophageal reflux disease    History of the same patient being treated with Aciphex daily.  Since CABG surgery has increased will discontinue Aciphex to Protonix 40 mg daily and then stepdown to Protonix 20 mg daily for 2 months thereafter      Relevant Medications   pantoprazole (PROTONIX) 40 MG tablet     Other  Shortness of breath    Increased shortness of breath since surgery.  Will obtain stat chest x-ray to make sure no pleural effusions or occult pneumonia is postsurgery.      Relevant Orders   CBC   Basic metabolic panel   DG Chest 2 View   Fatigue    Multifactorial but given patient recently had surgery and was anemic we will recheck CBC today.      Relevant Orders   CBC   Basic metabolic panel   S/P CABG x 4    Patient has a follow-up with cardiothoracic surgery in 1 week keep appointment as scheduled follow-up with him as recommended      Relevant Orders   DG Chest 2 [facility] discharge follow-up - Primary    Did review discharge hospital note.  Patient brought in [facility] paperwork that was reviewed       Return in about 4 weeks (around 02/06/2023) for SHOB/fatigue/ CABG recheck .    Audria Nine, NP

## 2023-01-09 NOTE — Telephone Encounter (Signed)
Patient called to let Dr. Okey Dupre know she had a quadruple bypass surgery and has now been released from the hospital.

## 2023-01-09 NOTE — Patient Instructions (Signed)
Nice to see you today I will be in touch with the labs and xray once I have reviewed them Follow up with me in 1 month Once the protonix (pantoprazole) comes in stop taking the aciphex and start the new medication

## 2023-01-09 NOTE — Assessment & Plan Note (Signed)
Did review discharge hospital note.  Patient brought in [facility] paperwork that was reviewed

## 2023-01-09 NOTE — Assessment & Plan Note (Signed)
Multifactorial but given patient recently had surgery and was anemic we will recheck CBC today.

## 2023-01-09 NOTE — Assessment & Plan Note (Signed)
Increased shortness of breath since surgery.  Will obtain stat chest x-ray to make sure no pleural effusions or occult pneumonia is postsurgery.

## 2023-01-09 NOTE — Assessment & Plan Note (Signed)
History of the same patient being treated with Aciphex daily.  Since CABG surgery has increased will discontinue Aciphex to Protonix 40 mg daily and then stepdown to Protonix 20 mg daily for 2 months thereafter

## 2023-01-09 NOTE — Assessment & Plan Note (Signed)
Patient has a follow-up with cardiothoracic surgery in 1 week keep appointment as scheduled follow-up with him as recommended

## 2023-01-09 NOTE — Telephone Encounter (Signed)
Contacted HH and spoke with Marylene Land, relayed message of approved verbal orders. Marylene Land confirmed and has no questions or concerns.

## 2023-01-10 ENCOUNTER — Other Ambulatory Visit: Payer: Self-pay | Admitting: Nurse Practitioner

## 2023-01-10 ENCOUNTER — Encounter: Payer: Self-pay | Admitting: Internal Medicine

## 2023-01-10 ENCOUNTER — Ambulatory Visit: Payer: Medicare Other | Attending: Internal Medicine | Admitting: Internal Medicine

## 2023-01-10 VITALS — BP 128/64 | HR 69 | Ht 65.0 in | Wt 168.0 lb

## 2023-01-10 DIAGNOSIS — I25119 Atherosclerotic heart disease of native coronary artery with unspecified angina pectoris: Secondary | ICD-10-CM | POA: Insufficient documentation

## 2023-01-10 DIAGNOSIS — J90 Pleural effusion, not elsewhere classified: Secondary | ICD-10-CM

## 2023-01-10 MED ORDER — FUROSEMIDE 20 MG PO TABS: 20.0000 mg | ORAL_TABLET | Freq: Every day | ORAL | 0 refills | Status: DC

## 2023-01-10 NOTE — Telephone Encounter (Signed)
Thank you for the update.  Please have her follow-up with me or an APP in about 3-4 weeks.  Yvonne Kendall, MD [facility]

## 2023-01-10 NOTE — Patient Instructions (Signed)
Medication Instructions:  No changes *If you need a refill on your cardiac medications before your next appointment, please call your pharmacy*   Lab Work: None ordered If you have labs (blood work) drawn today and your tests are completely normal, you will receive your results only by: MyChart Message (if you have MyChart) OR A paper copy in the mail If you have any lab test that is abnormal or we need to change your treatment, we will call you to review the results.   Testing/Procedures: None ordered   Follow-Up: At [facility], you and your health needs are our priority.  As part of our continuing mission to provide you with exceptional heart care, we have created designated Provider Care Teams.  These Care Teams include your primary Cardiologist (physician) and Advanced Practice Providers (APPs -  Physician Assistants and Nurse Practitioners) who all work together to provide you with the care you need, when you need it.  We recommend signing up for the patient portal called "MyChart".  Sign up information is provided on this After Visit Summary.  MyChart is used to connect with patients for Virtual Visits (Telemedicine).  Patients are able to view lab/test results, encounter notes, upcoming appointments, etc.  Non-urgent messages can be sent to your provider as well.   To learn more about what you can do with MyChart, go to [email or web address].    Your next appointment:   1 month(s)  Provider:   You may see Yvonne Kendall, MD or one of the following Advanced Practice Providers on your designated Care Team:   Nicolasa Ducking, NP Eula Listen, PA-C Cadence Fransico Michael, PA-C Charlsie Quest, NP

## 2023-01-10 NOTE — Progress Notes (Signed)
Cardiology Office Note:  .   Date:  01/10/2023  ID:  Christine [name], DOB 11-May-1933, MRN [id number] PCP: Eden Emms, NP  [facility] Providers Cardiologist:  Yvonne Kendall, MD     History of Present Illness: .   Christine [name] is a 89 y.o. female with history of coronary artery disease is post recent CABG (LIMA-LAD, sequential SVG-OM1-OM2, and SVG-distal RCA), carotid artery stenosis, hypertension, hyperlipidemia, rheumatic fever, asthma, hypothyroidism, and GERD who presents for follow-up of coronary artery disease.  I last saw her in late September for review of her cardiac CTA that showed multivessel coronary artery disease.  She subsequently underwent catheterization that confirmed severe multivessel CAD.  She was admitted to [facility] and underwent CABG with Dr. Eupha Fetch on 12/13/2022.  Postoperative course was complicated by paroxysmal atrial fibrillation/flutter.  She was not started on anticoagulation due to concerns about acute blood loss anemia.  She was discharged on 12/21/2022.  Today, [name] reports that she is feeling fairly well.  She continues to have quite a bit of indigestion that has been present ever since her bypass surgery.  She was recently prescribed pantoprazole by her PCP but is still waiting for the medication to arrive from [facility].  She also has quite a bit of shortness of breath though this has been improving over the last week or 2.  She feels like it is hard for her to take in a deep breath.  She had a chest x-ray performed yesterday by Mr. Cable, which showed persistent bilateral pleural effusions and consolidations/atelectasis.  She was prescribed furosemide but has not yet picked this up from the pharmacy.  She has not had any anginal chest pain.  Leg edema has improved significantly over the last few weeks.  She initially spent 14 days at rehab facility and has been back home for almost a week now.  She has not  had much of an appetite but is forcing herself to eat.  She denies palpitations and lightheadedness.  She is scheduled for follow-up with Dr. Alethia Fetch next week.  ROS: See HPI  Studies Reviewed: Marland Kitchen   EKG Interpretation Date/Time:  Wednesday January 10 2023 15:52:36 EDT Ventricular Rate:  69 PR Interval:  150 QRS Duration:  84 QT Interval:  422 QTC Calculation: 453 R Axis:   12  Text Interpretation: Normal sinus rhythm Nonspecific T wave abnormality Abnormal ECG When compared with ECG of 15-Dec-2022 21:17, Sinus rhythm has replaced Atrial fibrillation Vent. rate has decreased BY  38 BPM Confirmed by Orval Dortch [phone]) on 01/10/2023 7:33:36 PM    TTE (12/12/2022):  1. Left ventricular ejection fraction, by estimation, is 60 to 65%. The  left ventricle has normal function. The left ventricle has no regional  wall motion abnormalities. Left ventricular diastolic parameters are  consistent with Grade I diastolic  dysfunction (impaired relaxation).   2. Right ventricular systolic function is normal. The right ventricular  size is normal. Tricuspid regurgitation signal is inadequate for assessing  PA pressure.   3. The mitral valve is normal in structure. No evidence of mitral valve  regurgitation. No evidence of mitral stenosis.   4. The aortic valve is tricuspid. Aortic valve regurgitation is not  visualized. No aortic stenosis is present.   5. The inferior vena cava is normal in size with greater than 50%  respiratory variability, suggesting right atrial pressure of 3 mmHg.   LHC (12/11/2022): Significant multivessel coronary artery disease, including sequential 60% ostial and  50% mid LAD stenoses, complex ostial LCx stenosis involving trifurcation of OM1, OM2, and LCx proper with hazy 90% stenosis, and diffuse calcified RCA disease of up to 70%. Normal left ventricular systolic function (LVEF 55-65%) and filling pressure (LVEDP 10 mmHg). Risk Assessment/Calculations:             Physical Exam:   VS:  BP 128/64 (BP Location: Left Arm, Patient Position: Sitting, Cuff Size: Normal)   Pulse 69   Ht 5\' 5"  (1.651 m)   Wt 168 lb (76.2 kg)   SpO2 95%   BMI 27.96 kg/m    Wt Readings from Last 3 Encounters:  01/10/23 168 lb (76.2 kg)  01/09/23 166 lb 9.6 oz (75.6 kg)  12/21/22 180 lb 3.2 oz (81.7 kg)    General:  NAD. Neck: No JVD or HJR. Lungs: Mildly diminished breath sounds at both lung bases without wheezes or crackles. Heart: Regular rate and rhythm without murmurs, rubs, or gallops.  Median sternotomy incision is well-healed. Abdomen: Soft, nontender, nondistended. Extremities: Trace pretibial edema bilaterally.  ASSESSMENT AND PLAN: .    Coronary artery disease: Ms. Ching is recovering well from her CABG last month.  She has not had any chest pain reminiscent of her pre-CABG discomfort.  We will continue her current regimen of aspirin, metoprolol, and rosuvastatin.  Postoperative atrial fibrillation: [name] was noted to have atrial fibrillation for which she was started on amiodarone.  She had converted to sinus rhythm prior to discharge and remains in sinus rhythm today.  I wonder if some of her indigestion, nausea, and diminished appetite or side effects from amiodarone.  We discussed the role for amiodarone to help lessen the risk of recurrent postoperative atrial fibrillation.  If possible, I would like to continue amiodarone for 3 months from the time of her surgery and then stop it.  However, if side effects become intolerable, de-escalation to 100 mg daily or discontinuation altogether could be considered.  She was not placed on anticoagulation while in the hospital due to concerns about blood loss.  Defer anticoagulation today.  Pleural effusions: I suspect dyspnea is related to small bilateral pleural effusions noted on recent chest radiograph.  I encouraged [name] to pick up her prescription for furosemide 20 mg daily and to begin  this.  Fusions can be reevaluated when she sees Dr. Dashanique Fetch next week and tapped if necessary.  Hypertension: Blood pressure well-controlled today.  Continue current doses of metoprolol and losartan.  Indigestion: This seems to may have some degree of GERD.  GI upset related to amiodarone is also a possibility.  She has been prescribed pantoprazole, which is scheduled to arrive later this week.  I have encouraged her to begin using this as directed by Mr. Cable.    Cardiac Rehabilitation Eligibility Assessment  The patient is ready to start cardiac rehabilitation pending clearance from the cardiac surgeon.    Dispo: Return to clinic in 1 month.  Signed, Yvonne Kendall, MD

## 2023-01-12 ENCOUNTER — Telehealth: Payer: Self-pay | Admitting: Nurse Practitioner

## 2023-01-12 DIAGNOSIS — Z48812 Encounter for surgical aftercare following surgery on the circulatory system: Secondary | ICD-10-CM | POA: Diagnosis not present

## 2023-01-12 DIAGNOSIS — J4520 Mild intermittent asthma, uncomplicated: Secondary | ICD-10-CM | POA: Diagnosis not present

## 2023-01-12 DIAGNOSIS — I25110 Atherosclerotic heart disease of native coronary artery with unstable angina pectoris: Secondary | ICD-10-CM | POA: Diagnosis not present

## 2023-01-12 DIAGNOSIS — E46 Unspecified protein-calorie malnutrition: Secondary | ICD-10-CM | POA: Diagnosis not present

## 2023-01-12 DIAGNOSIS — I10 Essential (primary) hypertension: Secondary | ICD-10-CM | POA: Diagnosis not present

## 2023-01-12 DIAGNOSIS — I088 Other rheumatic multiple valve diseases: Secondary | ICD-10-CM | POA: Diagnosis not present

## 2023-01-12 NOTE — Telephone Encounter (Signed)
Verbal orders approved.

## 2023-01-12 NOTE — Telephone Encounter (Signed)
Christine [name] from [facility] called in and stated he was returning a phone call Christine [name] stated that he would like a call back concerning verbal orders for pt neek week Christine [name] can be reached at [phone]

## 2023-01-12 NOTE — Telephone Encounter (Signed)
Home Health verbal orders Caller Name: donald  Agency Name: [employer] number: [id number], secured   Requesting OT  Reason: eval   Frequency: will call back with frequency   Please forward to [facility] pool or providers CMA

## 2023-01-12 NOTE — Telephone Encounter (Signed)
Left message to call back with frequency.

## 2023-01-12 NOTE — Telephone Encounter (Signed)
Received call back states was not able to do evaluation today is requesting verbal for evaluation and will call back next week with frequency. Per [employer] given

## 2023-01-12 NOTE — Telephone Encounter (Signed)
Spoke with Roe Coombs and verbal orders have already been given in another TE.

## 2023-01-14 DIAGNOSIS — J4520 Mild intermittent asthma, uncomplicated: Secondary | ICD-10-CM | POA: Diagnosis not present

## 2023-01-14 DIAGNOSIS — I10 Essential (primary) hypertension: Secondary | ICD-10-CM | POA: Diagnosis not present

## 2023-01-14 DIAGNOSIS — Z48812 Encounter for surgical aftercare following surgery on the circulatory system: Secondary | ICD-10-CM | POA: Diagnosis not present

## 2023-01-14 DIAGNOSIS — E46 Unspecified protein-calorie malnutrition: Secondary | ICD-10-CM | POA: Diagnosis not present

## 2023-01-14 DIAGNOSIS — I088 Other rheumatic multiple valve diseases: Secondary | ICD-10-CM | POA: Diagnosis not present

## 2023-01-14 DIAGNOSIS — I25110 Atherosclerotic heart disease of native coronary artery with unstable angina pectoris: Secondary | ICD-10-CM | POA: Diagnosis not present

## 2023-01-15 ENCOUNTER — Telehealth: Payer: Self-pay | Admitting: Internal Medicine

## 2023-01-15 ENCOUNTER — Other Ambulatory Visit: Payer: Self-pay | Admitting: Thoracic Surgery (Cardiothoracic Vascular Surgery)

## 2023-01-15 ENCOUNTER — Telehealth: Payer: Self-pay | Admitting: Cardiology

## 2023-01-15 DIAGNOSIS — Z951 Presence of aortocoronary bypass graft: Secondary | ICD-10-CM

## 2023-01-15 NOTE — Telephone Encounter (Signed)
Spoke with Tasia Catchings with [facility]. He reports that patients blood pressures have been running 140-170's over 70-90's. He states that patient does have appointment tomorrow with Dr. Caytlyn Fetch as well. Reports that she does keep a log of her blood pressure readings and requested that she please send those via my chart. Advised that I would send to Dr. Okey Dupre so that he is aware as well. He verbalized understanding with no further concerns at this time.

## 2023-01-15 NOTE — Telephone Encounter (Signed)
error 

## 2023-01-15 NOTE — Telephone Encounter (Signed)
Pt c/o BP issue:  1. What are your last 5 BP readings? 140-170 70-80 2. Are you having any other symptoms (ex. Dizziness, headache, blurred vision, passed out)? Fatigue, sob easy 3. What is your medication issue? na

## 2023-01-16 ENCOUNTER — Encounter: Payer: Self-pay | Admitting: Thoracic Surgery (Cardiothoracic Vascular Surgery)

## 2023-01-16 ENCOUNTER — Ambulatory Visit (INDEPENDENT_AMBULATORY_CARE_PROVIDER_SITE_OTHER): Payer: Medicare Other | Admitting: Thoracic Surgery (Cardiothoracic Vascular Surgery)

## 2023-01-16 VITALS — BP 149/76 | HR 68 | Resp 20 | Ht 65.0 in | Wt 165.0 lb

## 2023-01-16 DIAGNOSIS — Z951 Presence of aortocoronary bypass graft: Secondary | ICD-10-CM

## 2023-01-16 MED ORDER — METOPROLOL SUCCINATE ER 25 MG PO TB24: 25.0000 mg | ORAL_TABLET | Freq: Every day | ORAL | 0 refills | Status: DC

## 2023-01-16 MED ORDER — METOPROLOL SUCCINATE ER 25 MG PO TB24: 25.0000 mg | ORAL_TABLET | Freq: Every day | ORAL | 1 refills | Status: DC

## 2023-01-16 NOTE — Progress Notes (Signed)
[address]       Christine [name] [id number]             [phone]     HPI: Christine [name] returns for a scheduled follow-up visit after recent CABG.  Christine [name] is an 89 year old woman with a history of coronary disease, dyslipidemia, hypertension, acquired hypothyroidism.  He presented with new onset chest pain.  A coronary CT showed a calcium score of 2080.  She then underwent cardiac catheterization which showed severe three-vessel coronary disease.  She underwent coronary bypass grafting x 4 on 12/13/2022.  Postoperatively she had atrial fibrillation but converted to sinus rhythm with amiodarone.  She was discharged to a skilled nursing facility on postoperative day #8.  She feels well.  She denies any incisional pain.  No swelling in her legs or shortness of breath.  No recurrent anginal symptoms.  Anxious to resume her normal activities.  Past Medical History:  Diagnosis Date   Asthma    Carotid arterial disease (HCC)    a. 06/2019 Carotid U/S: RICA 50-69%. Mod LICA plaque.   Chicken pox    Diastolic dysfunction    a. 02/2020 Echo: EF 60-65%, no rwma, GrI DD. Nl RV size/fxn. Triv MR. Mild-mod Ao sclerosis w/o stenois.   Frequent headaches    GERD (gastroesophageal reflux disease)    Heart murmur    a. 02/2020 Echo: Triv MR. Mild-mod Ao sclerosis w/o stenosis.   Hyperlipidemia    Hypertension    Hypothyroidism    Nonobstructive CAD (coronary artery disease)    a.  Remote Cath in [facility] - reportedly nonobs dzs; b. 03/2020 Cor CTA:  Ca2+ = 1844 (94th %'ile). LAD 50p, RCA 25-49 (FFRct nl in prox RCA w/ slow taper in mid-dist segments from 0.8-->0.6).   Rheumatic fever    Urine incontinence     Current Outpatient Medications  Medication Sig Dispense Refill   albuterol (VENTOLIN HFA) 108 (90 Base) MCG/ACT inhaler Inhale 2 puffs into the lungs every 6 (six) hours as needed for wheezing or shortness of breath. 8 g 2   aspirin EC 81 MG tablet Take 1 tablet (81  mg total) by mouth daily. Swallow whole. 90 tablet 3   feeding supplement (ENSURE ENLIVE / ENSURE PLUS) LIQD Take 237 mLs by mouth 3 (three) times daily with meals.     fluticasone (FLONASE) 50 MCG/ACT nasal spray Place 1 spray into both nostrils daily as needed for allergies or rhinitis.     fluticasone-salmeterol (WIXELA INHUB) 250-50 MCG/ACT AEPB Inhale 1 puff into the lungs in the morning and at bedtime.     furosemide (LASIX) 20 MG tablet Take 1 tablet (20 mg total) by mouth daily. 10 tablet 0   levothyroxine (SYNTHROID) 75 MCG tablet Take 1 tablet (75 mcg total) by mouth daily. 30 tablet 0   loratadine (CLARITIN) 10 MG tablet Take 10 mg by mouth daily as needed for allergies.     losartan (COZAAR) 50 MG tablet Take 1 tablet (50 mg total) by mouth daily. 90 tablet 3   metoprolol succinate (TOPROL XL) 25 MG 24 hr tablet Take 1 tablet (25 mg total) by mouth daily. 10 tablet 0   pantoprazole (PROTONIX) 40 MG tablet Take 1 tablet (40 mg total) by mouth daily. 30 tablet 0   rosuvastatin (CRESTOR) 20 MG tablet TAKE 1 TABLET DAILY 90 tablet 0   traMADol (ULTRAM) 50 MG tablet Take 1 tablet (50 mg total) by mouth every 6 (  six) hours as needed for moderate pain. 28 tablet 0   metoprolol succinate (TOPROL-XL) 25 MG 24 hr tablet Take 1 tablet (25 mg total) by mouth daily. 90 tablet 1   No current facility-administered medications for this visit.    Physical Exam BP (!) 149/76 (BP Location: Left Arm, Patient Position: Sitting, Cuff Size: Normal)   Pulse 68   Resp 20   Ht 5\' 5"  (1.651 m)   Wt 165 lb (74.8 kg)   SpO2 98% Comment: RA  BMI 27.20 kg/m  89 year old woman in no acute distress Alert and oriented x 3 with no focal deficits Lungs clear with equal breath sounds bilaterally Cardiac regular rate and rhythm with normal S1 and S2, faint systolic murmur Sternum stable, incision clean dry and intact No peripheral edema, leg incisions healing well  Diagnostic Tests: Chest x-ray from  01/09/2023 revealed small pleural effusions and basilar atelectasis.  Impression: Christine [name] is an 89 year old woman with a history of coronary disease, dyslipidemia, hypertension, acquired hypothyroidism.    Three-vessel coronary disease-status post CABG x 4.  Doing well with no recurrent angina.  She is not having any incisional pain.  Advised her not to lift anything over 10 pounds for another 2 weeks.  Otherwise her activities are unrestricted.  Postoperative atrial fibrillation-normal rhythm today.  She is on oral amiodarone.  Only has 2 tablets left.  I advised her she can stop it after those are gone.  Hypertension-blood pressure has been consistently elevated over the past couple of weeks.  Will increase Toprol to 25 mg daily.  Plan: Increase Toprol XL to 25 mg daily Stop amiodarone after final 2 doses Follow-up with Dr. Okey Dupre I will be happy to see Christine [name] back anytime in the future if I can be of any further assistance with her care.  Loreli Slot, MD [facility] [phone]

## 2023-01-17 ENCOUNTER — Other Ambulatory Visit (INDEPENDENT_AMBULATORY_CARE_PROVIDER_SITE_OTHER): Payer: Medicare Other

## 2023-01-17 DIAGNOSIS — J90 Pleural effusion, not elsewhere classified: Secondary | ICD-10-CM | POA: Diagnosis not present

## 2023-01-17 LAB — BASIC METABOLIC PANEL
BUN: 10 mg/dL (ref 6–23)
CO2: 29 meq/L (ref 19–32)
Calcium: 9.7 mg/dL (ref 8.4–10.5)
Chloride: 100 meq/L (ref 96–112)
Creatinine, Ser: 0.94 mg/dL (ref 0.40–1.20)
GFR: 54.5 mL/min — ABNORMAL LOW (ref >60.00)
Glucose, Bld: 111 mg/dL — ABNORMAL HIGH (ref 70–99)
Potassium: 3.5 meq/L (ref 3.5–5.1)
Sodium: 139 meq/L (ref 135–145)

## 2023-01-17 NOTE — Telephone Encounter (Signed)
It appears that Dr. Lakeena Fetch increased [name]'s metoprolol succinate at their visit yesterday.  I encouraged her to continue monitoring her blood pressure and to alert [place] if it remains consistently above 140/90.  Yvonne Kendall, MD [facility]

## 2023-01-18 ENCOUNTER — Encounter: Payer: Self-pay | Admitting: Nurse Practitioner

## 2023-01-18 ENCOUNTER — Telehealth: Payer: Self-pay

## 2023-01-18 DIAGNOSIS — Z48812 Encounter for surgical aftercare following surgery on the circulatory system: Secondary | ICD-10-CM | POA: Diagnosis not present

## 2023-01-18 DIAGNOSIS — J4520 Mild intermittent asthma, uncomplicated: Secondary | ICD-10-CM | POA: Diagnosis not present

## 2023-01-18 DIAGNOSIS — I088 Other rheumatic multiple valve diseases: Secondary | ICD-10-CM | POA: Diagnosis not present

## 2023-01-18 DIAGNOSIS — I25110 Atherosclerotic heart disease of native coronary artery with unstable angina pectoris: Secondary | ICD-10-CM | POA: Diagnosis not present

## 2023-01-18 DIAGNOSIS — I10 Essential (primary) hypertension: Secondary | ICD-10-CM | POA: Diagnosis not present

## 2023-01-18 DIAGNOSIS — E46 Unspecified protein-calorie malnutrition: Secondary | ICD-10-CM | POA: Diagnosis not present

## 2023-01-18 NOTE — Telephone Encounter (Signed)
[facility] home health forms completed and faxed and copies made.

## 2023-01-22 DIAGNOSIS — Z48812 Encounter for surgical aftercare following surgery on the circulatory system: Secondary | ICD-10-CM | POA: Diagnosis not present

## 2023-01-22 DIAGNOSIS — I088 Other rheumatic multiple valve diseases: Secondary | ICD-10-CM | POA: Diagnosis not present

## 2023-01-22 DIAGNOSIS — I25110 Atherosclerotic heart disease of native coronary artery with unstable angina pectoris: Secondary | ICD-10-CM | POA: Diagnosis not present

## 2023-01-22 DIAGNOSIS — I10 Essential (primary) hypertension: Secondary | ICD-10-CM | POA: Diagnosis not present

## 2023-01-22 DIAGNOSIS — E46 Unspecified protein-calorie malnutrition: Secondary | ICD-10-CM | POA: Diagnosis not present

## 2023-01-22 DIAGNOSIS — J4520 Mild intermittent asthma, uncomplicated: Secondary | ICD-10-CM | POA: Diagnosis not present

## 2023-01-25 ENCOUNTER — Other Ambulatory Visit: Payer: Self-pay

## 2023-01-25 DIAGNOSIS — I10 Essential (primary) hypertension: Secondary | ICD-10-CM

## 2023-01-25 MED ORDER — METOPROLOL SUCCINATE ER 25 MG PO TB24: 25.0000 mg | ORAL_TABLET | Freq: Every day | ORAL | 0 refills | Status: DC

## 2023-01-31 DIAGNOSIS — I25110 Atherosclerotic heart disease of native coronary artery with unstable angina pectoris: Secondary | ICD-10-CM | POA: Diagnosis not present

## 2023-01-31 DIAGNOSIS — J4520 Mild intermittent asthma, uncomplicated: Secondary | ICD-10-CM | POA: Diagnosis not present

## 2023-01-31 DIAGNOSIS — E46 Unspecified protein-calorie malnutrition: Secondary | ICD-10-CM | POA: Diagnosis not present

## 2023-01-31 DIAGNOSIS — I088 Other rheumatic multiple valve diseases: Secondary | ICD-10-CM | POA: Diagnosis not present

## 2023-01-31 DIAGNOSIS — I10 Essential (primary) hypertension: Secondary | ICD-10-CM | POA: Diagnosis not present

## 2023-01-31 DIAGNOSIS — Z48812 Encounter for surgical aftercare following surgery on the circulatory system: Secondary | ICD-10-CM | POA: Diagnosis not present

## 2023-02-01 ENCOUNTER — Ambulatory Visit: Payer: Medicare Other | Admitting: Internal Medicine

## 2023-02-01 ENCOUNTER — Encounter: Payer: Self-pay | Admitting: Internal Medicine

## 2023-02-01 ENCOUNTER — Telehealth: Payer: Self-pay

## 2023-02-01 VITALS — BP 138/84 | HR 79 | Temp 97.7°F | Ht 65.0 in | Wt 164.0 lb

## 2023-02-01 DIAGNOSIS — R0609 Other forms of dyspnea: Secondary | ICD-10-CM | POA: Diagnosis not present

## 2023-02-01 NOTE — Progress Notes (Signed)
Subjective:    Patient ID: Christine [name], female    DOB: [birth year], 89 y.o.   MRN: [id number]  HPI Here due to shortness of breath  Had CABG 10/2 Recovered fairly well Had post op atrial fibrillation---she hasn't felt this (hasn't noticed anything significant)  Had post op effusions Did take furosemide for 10 days---done at least 2 weeks ago Doesn't weigh herself  No chest pain Does have some trouble taking a deep breath Needs to keep head up on extra pillow at night---no PND No edema  At PT---working her fairly hard. 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 8 g 2   aspirin EC 81 MG tablet Take 1 tablet (81 mg total) by mouth daily. Swallow whole. 90 tablet 3   fluticasone (FLONASE) 50 MCG/ACT nasal spray Place 1 spray into both nostrils daily as needed for allergies or rhinitis.     fluticasone-salmeterol (WIXELA INHUB) 250-50 MCG/ACT AEPB Inhale 1 puff into the lungs in the morning and at bedtime.     levothyroxine (SYNTHROID) 75 MCG tablet Take 1 tablet (75 mcg total) by mouth daily. 30 tablet 0   loratadine (CLARITIN) 10 MG tablet Take 10 mg by mouth daily as needed for allergies.     losartan (COZAAR) 50 MG tablet Take 1 tablet (50 mg total) by mouth daily. 90 tablet 3   metoprolol succinate (TOPROL-XL) 25 MG 24 hr tablet Take 1 tablet (25 mg total) by mouth daily. 90 tablet 0   pantoprazole (PROTONIX) 40 MG tablet Take 1 tablet (40 mg total) by mouth daily. 30 tablet 0   rosuvastatin (CRESTOR) 20 MG tablet TAKE 1 TABLET DAILY 90 tablet 0   furosemide (LASIX) 20 MG tablet Take 1 tablet  (20 mg total) by mouth daily. (Patient not taking: Reported on 02/01/2023) 10 tablet 0   No current facility-administered medications on file prior to visit.    Allergies  Allergen Reactions   Atorvastatin     Myalgias    Pollen Extract     Sneezing, coughing     Past Medical History:  Diagnosis Date   Asthma    Carotid arterial disease (HCC)    a. 06/2019 Carotid U/S: RICA 50-69%. Mod LICA plaque.   Chicken pox    Diastolic dysfunction    a. 02/2020 Echo: EF 60-65%, no rwma, GrI DD. Nl RV size/fxn. Triv MR. Mild-mod Ao sclerosis w/o stenois.   Frequent headaches    GERD (gastroesophageal reflux disease)    Heart murmur    a. 02/2020 Echo: Triv MR. Mild-mod Ao sclerosis w/o stenosis.   Hyperlipidemia    Hypertension    Hypothyroidism    Nonobstructive CAD (coronary artery disease)    a.  Remote Cath in [facility] - reportedly nonobs dzs; b. 03/2020 Cor CTA:  Ca2+ = 1844 (94th %'ile). LAD 50p, RCA 25-49 (FFRct nl in prox RCA w/ slow taper in mid-dist segments from 0.8-->0.6).   Rheumatic fever    Urine incontinence     Past Surgical History:  Procedure Laterality Date   ABDOMINAL HYSTERECTOMY     APPENDECTOMY  BREAST BIOPSY     CORONARY ARTERY BYPASS GRAFT N/A 12/13/2022   Procedure: CORONARY ARTERY BYPASS GRAFTING (CABG) TIMES FOUR UTILIZING LEFT INTERNAL MAMMARY ARTERY AND ENDOSCOPIC VEIN HARVEST RIGHT GREATER SAPHENOUS VEIN;  Surgeon: Loreli Slot, MD;  Location: [facility] OR;  Service: Open Heart Surgery;  Laterality: N/A;   LEFT HEART CATH AND CORONARY ANGIOGRAPHY N/A 12/11/2022   Procedure: LEFT HEART CATH AND CORONARY ANGIOGRAPHY;  Surgeon: Yvonne Kendall, MD;  Location: [facility] INVASIVE CV LAB;  Service: Cardiovascular;  Laterality: N/A;   MASTECTOMY Bilateral 04/1979   TEE WITHOUT CARDIOVERSION N/A 12/13/2022   Procedure: TRANSESOPHAGEAL ECHOCARDIOGRAM;  Surgeon: Loreli Slot, MD;  [facility];  Service: Open Heart Surgery;  Laterality: N/A;    Family  History  Problem Relation Age of Onset   Early death Mother 55   Kidney disease Mother        bright disease   Other Father        tumor on the side of his leg   Heart disease Brother    Heart attack Brother 41   Heart disease Brother 58   Leukemia Daughter    Valvular heart disease Daughter        s/p mitral valve replacement    Social History   Socioeconomic History   Marital status: Widowed    Spouse name: Not on file   Number of children: 4   Years of education: college   Highest education level: Not on file  Occupational History   Not on file  Tobacco Use   Smoking status: Never   Smokeless tobacco: Never  Vaping Use   Vaping status: Never Used  Substance and Sexual Activity   Alcohol use: Yes    Comment: maybe once a year   Drug use: Never   Sexual activity: Not Currently  Other Topics Concern   Not on file  Social History Narrative   06/03/19   From: [place] originally, moved here to be near daughter   Living: with daughter - Pamelia Hoit (Nori Riis)   Work: retired from [employer] engineer, Engineer, site      Family: 4 children - Ronni and K[place] in [place], daughter in [place] and has adopted daughter in [place]       Enjoys: play tennis, golf, walking, board games, gardening      Exercise: not currently - walking around the house   Diet: not as good as it should be, usually tries to be healthy, avoids fried foods, limits red meat      Safety   Seat belts: Yes    Guns: no   Safe in relationships: Yes    Social Determinants of Corporate investment banker Strain: Not on file  Food Insecurity: No Food Insecurity (12/11/2022)   Hunger Vital Sign    Worried About Running Out of Food in the Last Year: Never true    Ran Out of Food in the Last Year: Never true  Transportation Needs: No Transportation Needs (12/11/2022)   PRAPARE - Administrator, Civil Service (Medical): No    Lack of Transportation (Non-Medical): No  Physical Activity: Not on file   Stress: Not on file  Social Connections: Not on file  Intimate Partner Violence: Not At Risk (12/11/2022)   Humiliation, Afraid, Rape, and Kick questionnaire    Fear of Current or Ex-Partner: No    Emotionally Abused: No    Physically Abused: No    Sexually Abused: No   Review of  Systems Appetite is not good--nothing appeals to her Eats okay though No N/V     Objective:   Physical Exam Constitutional:      Appearance: Normal appearance.  Cardiovascular:     Rate and Rhythm: Normal rate and regular rhythm.     Heart sounds: No murmur heard.    No gallop.  Pulmonary:     Effort: Pulmonary effort is normal.     Breath sounds: Normal breath sounds. No wheezing or rales.     Comments: Dullness to percussion just at the very low part of left chest Musculoskeletal:     Cervical back: Neck supple.  Lymphadenopathy:     Cervical: No cervical adenopathy.  Neurological:     Mental Status: She is alert.            Assessment & Plan:

## 2023-02-01 NOTE — Assessment & Plan Note (Signed)
She was not concerned---but came in because PT asked her to No recurrence of atrial fibrillation Past pleural effusion seem improved based on exam Nothing worrisome in her history  Reassured--no action needed Could recheck CXR or consider echo if sig issues arise

## 2023-02-01 NOTE — Telephone Encounter (Signed)
Okay ?I will assess her at the visit ?

## 2023-02-01 NOTE — Telephone Encounter (Signed)
I spoke with Christine [name] and on 01/31/23 while working with physical therapist Christine [name] developed SOB. Christine [name] said few wks ago had some fluid in lungs and Oct 2024 had quadruple bypass. Christine [name] said no CP at all and no SOB since exercising with Christine [name]. Christine [name] said Christine [name] advised Christine [name] to be seen. Christine [name] has appt with Dr Alphonsus Sias on 02/01/23 at 3:30 with UC & ED precautions and Christine [name] voiced understanding. Christine [name] said she has been resting today without any exertion. Christine [name] is drinking fluids to stay hydrated. Sending note to Dr Alphonsus Sias and Alphonsus Sias pool.

## 2023-02-02 ENCOUNTER — Ambulatory Visit: Payer: Medicare Other | Admitting: Family Medicine

## 2023-02-05 DIAGNOSIS — I6523 Occlusion and stenosis of bilateral carotid arteries: Secondary | ICD-10-CM | POA: Diagnosis not present

## 2023-02-05 DIAGNOSIS — E46 Unspecified protein-calorie malnutrition: Secondary | ICD-10-CM | POA: Diagnosis not present

## 2023-02-05 DIAGNOSIS — F324 Major depressive disorder, single episode, in partial remission: Secondary | ICD-10-CM | POA: Diagnosis not present

## 2023-02-05 DIAGNOSIS — F419 Anxiety disorder, unspecified: Secondary | ICD-10-CM | POA: Diagnosis not present

## 2023-02-05 DIAGNOSIS — I10 Essential (primary) hypertension: Secondary | ICD-10-CM | POA: Diagnosis not present

## 2023-02-05 DIAGNOSIS — Z7982 Long term (current) use of aspirin: Secondary | ICD-10-CM | POA: Diagnosis not present

## 2023-02-05 DIAGNOSIS — E785 Hyperlipidemia, unspecified: Secondary | ICD-10-CM | POA: Diagnosis not present

## 2023-02-05 DIAGNOSIS — M170 Bilateral primary osteoarthritis of knee: Secondary | ICD-10-CM | POA: Diagnosis not present

## 2023-02-05 DIAGNOSIS — Z9181 History of falling: Secondary | ICD-10-CM | POA: Diagnosis not present

## 2023-02-05 DIAGNOSIS — Z48812 Encounter for surgical aftercare following surgery on the circulatory system: Secondary | ICD-10-CM | POA: Diagnosis not present

## 2023-02-05 DIAGNOSIS — I25110 Atherosclerotic heart disease of native coronary artery with unstable angina pectoris: Secondary | ICD-10-CM | POA: Diagnosis not present

## 2023-02-05 DIAGNOSIS — R1310 Dysphagia, unspecified: Secondary | ICD-10-CM | POA: Diagnosis not present

## 2023-02-05 DIAGNOSIS — I088 Other rheumatic multiple valve diseases: Secondary | ICD-10-CM | POA: Diagnosis not present

## 2023-02-05 DIAGNOSIS — K219 Gastro-esophageal reflux disease without esophagitis: Secondary | ICD-10-CM | POA: Diagnosis not present

## 2023-02-05 DIAGNOSIS — E039 Hypothyroidism, unspecified: Secondary | ICD-10-CM | POA: Diagnosis not present

## 2023-02-05 DIAGNOSIS — G8929 Other chronic pain: Secondary | ICD-10-CM | POA: Diagnosis not present

## 2023-02-05 DIAGNOSIS — Z7951 Long term (current) use of inhaled steroids: Secondary | ICD-10-CM | POA: Diagnosis not present

## 2023-02-05 DIAGNOSIS — G4489 Other headache syndrome: Secondary | ICD-10-CM | POA: Diagnosis not present

## 2023-02-05 DIAGNOSIS — I4891 Unspecified atrial fibrillation: Secondary | ICD-10-CM | POA: Diagnosis not present

## 2023-02-05 DIAGNOSIS — J9811 Atelectasis: Secondary | ICD-10-CM | POA: Diagnosis not present

## 2023-02-05 DIAGNOSIS — Z9013 Acquired absence of bilateral breasts and nipples: Secondary | ICD-10-CM | POA: Diagnosis not present

## 2023-02-05 DIAGNOSIS — Z951 Presence of aortocoronary bypass graft: Secondary | ICD-10-CM | POA: Diagnosis not present

## 2023-02-05 DIAGNOSIS — J4520 Mild intermittent asthma, uncomplicated: Secondary | ICD-10-CM | POA: Diagnosis not present

## 2023-02-06 DIAGNOSIS — I088 Other rheumatic multiple valve diseases: Secondary | ICD-10-CM | POA: Diagnosis not present

## 2023-02-06 DIAGNOSIS — Z48812 Encounter for surgical aftercare following surgery on the circulatory system: Secondary | ICD-10-CM | POA: Diagnosis not present

## 2023-02-06 DIAGNOSIS — I10 Essential (primary) hypertension: Secondary | ICD-10-CM | POA: Diagnosis not present

## 2023-02-06 DIAGNOSIS — E46 Unspecified protein-calorie malnutrition: Secondary | ICD-10-CM | POA: Diagnosis not present

## 2023-02-06 DIAGNOSIS — J4520 Mild intermittent asthma, uncomplicated: Secondary | ICD-10-CM | POA: Diagnosis not present

## 2023-02-06 DIAGNOSIS — I25110 Atherosclerotic heart disease of native coronary artery with unstable angina pectoris: Secondary | ICD-10-CM | POA: Diagnosis not present

## 2023-02-14 ENCOUNTER — Ambulatory Visit (INDEPENDENT_AMBULATORY_CARE_PROVIDER_SITE_OTHER): Payer: Medicare Other | Admitting: Nurse Practitioner

## 2023-02-14 ENCOUNTER — Encounter: Payer: Self-pay | Admitting: Nurse Practitioner

## 2023-02-14 VITALS — BP 140/80 | HR 60 | Temp 97.4°F | Ht 65.0 in | Wt 163.0 lb

## 2023-02-14 DIAGNOSIS — R0609 Other forms of dyspnea: Secondary | ICD-10-CM

## 2023-02-14 DIAGNOSIS — K219 Gastro-esophageal reflux disease without esophagitis: Secondary | ICD-10-CM | POA: Diagnosis not present

## 2023-02-14 DIAGNOSIS — H6121 Impacted cerumen, right ear: Secondary | ICD-10-CM | POA: Diagnosis not present

## 2023-02-14 NOTE — Assessment & Plan Note (Signed)
Patient is was prepped per office policy and cerumen softening eardrops.  A mixture of water and hydrogen peroxide was used and ear was irrigated.  Patient tolerated procedure well.  Impaction was removed

## 2023-02-14 NOTE — Assessment & Plan Note (Signed)
Patient was on Aciphex previous that was not controlling acid reflux status post CABG.  We did switch to Protonix 40 mg patient has ran out of medication we will start back with Aciphex to see if this controls heartburn if so refill Aciphex if not consider continue the Protonix.

## 2023-02-14 NOTE — Assessment & Plan Note (Signed)
Has improved since diuresis.  Patient has not followed up with cardiothoracic surgery and today.  Has finished home health physical therapy.  Patient is able to go up and down her stairs several times a day without getting overly winded.  Stable no further workup at this juncture

## 2023-02-14 NOTE — Progress Notes (Addendum)
Established Patient Office Visit  Subjective   Patient ID: Christine [name], female    DOB: May 10, 1933  Age: 89 y.o. MRN: [id number]  Chief Complaint  Patient presents with  . Follow-up    CABG. Fluid around the lungs.   . Medication Management    Pt ran out of Protonix and would like to know if she needs to go back to previous medication.      HPI  Pleurel effusion: Patient was seen by me on 01/01/2023 for hospital discharge follow-up status post CABG.  Patient was having some shortness of breath which we obtained a chest x-ray that showed a pleural effusion.  Patient was given Lasix for approximately 10 days.  Patient was also having reflux that was not controlled with her Aciphex and was told this is normal finding postprocedure and she was found Protonix.  She is here for follow-up. In the interim patient was seen by colleague Tillman Abide 02/01/2023 for dyspnea on exertion.  This was at the direction of her physical therapist.  Patient was having no symptoms  Right ear: statse that it feels like it is closing off. States that she does keep them clean. She has used a curette and sprial cleaner. States that she has used peroxide.    Review of Systems  Constitutional:  Negative for chills and fever.  Respiratory:  Positive for shortness of breath (Improved).   Cardiovascular:  Negative for chest pain.  Neurological:  Negative for headaches.  Psychiatric/Behavioral:  Negative for hallucinations and suicidal ideas.       Objective:     BP (!) 140/80   Pulse 60   Temp (!) 97.4 F (36.3 C) (Oral)   Ht 5\' 5"  (1.651 m)   Wt 163 lb (73.9 kg)   SpO2 97%   BMI 27.12 kg/m  BP Readings from Last 3 Encounters:  02/14/23 (!) 140/80  02/01/23 138/84  01/16/23 (!) 149/76   Wt Readings from Last 3 Encounters:  02/14/23 163 lb (73.9 kg)  02/01/23 164 lb (74.4 kg)  01/16/23 165 lb (74.8 kg)   SpO2 Readings from Last 3 Encounters:  02/14/23 97%  02/01/23 96%   01/16/23 98%      Physical Exam Vitals and nursing note reviewed.  Constitutional:      Appearance: Normal appearance.  HENT:     Right Ear: Ear canal and external ear normal. There is impacted cerumen.     Left Ear: Tympanic membrane, ear canal and external ear normal.  Cardiovascular:     Rate and Rhythm: Normal rate and regular rhythm.     Heart sounds: Normal heart sounds.  Pulmonary:     Effort: Pulmonary effort is normal.     Breath sounds: Normal breath sounds.  Lymphadenopathy:     Cervical: No cervical adenopathy.  Neurological:     Mental Status: She is alert.     No results found for any visits on 02/14/23.    The ASCVD Risk score (Arnett DK, et al., 2019) failed to calculate for the following reasons:   The 2019 ASCVD risk score is only valid for ages 68 to 32    Assessment & Plan:   Problem List Items Addressed This Visit       Digestive   Gastroesophageal reflux disease    Patient was on Aciphex previous that was not controlling acid reflux status post CABG.  We did switch to Protonix 40 mg patient has ran out of medication we will start  back with Aciphex to see if this controls heartburn if so refill Aciphex if not consider continue the Protonix.        Nervous and Auditory   Impacted cerumen of right ear - Primary    Patient is was prepped per office policy and cerumen softening eardrops.  A mixture of water and hydrogen peroxide was used and ear was irrigated.  Patient tolerated procedure well.  Impaction was removed        Other   DOE (dyspnea on exertion)    Has improved since diuresis.  Patient has not followed up with cardiothoracic surgery and today.  Has finished home health physical therapy.  Patient is able to go up and down her stairs several times a day without getting overly winded.  Stable no further workup at this juncture       Return in about 3 months (around 05/15/2023) for Asthma, GERD, TSH.    Audria Nine, NP

## 2023-02-14 NOTE — Patient Instructions (Signed)
Nice to see you today I will see you in 3-4 months  Follow up sooner if you need

## 2023-02-20 ENCOUNTER — Ambulatory Visit: Payer: Medicare Other | Attending: Physician Assistant | Admitting: Physician Assistant

## 2023-02-20 ENCOUNTER — Encounter: Payer: Self-pay | Admitting: Physician Assistant

## 2023-02-20 VITALS — BP 138/70 | HR 67 | Ht 65.0 in | Wt 157.6 lb

## 2023-02-20 DIAGNOSIS — Z951 Presence of aortocoronary bypass graft: Secondary | ICD-10-CM

## 2023-02-20 DIAGNOSIS — I9789 Other postprocedural complications and disorders of the circulatory system, not elsewhere classified: Secondary | ICD-10-CM | POA: Diagnosis not present

## 2023-02-20 DIAGNOSIS — E785 Hyperlipidemia, unspecified: Secondary | ICD-10-CM | POA: Diagnosis not present

## 2023-02-20 DIAGNOSIS — I10 Essential (primary) hypertension: Secondary | ICD-10-CM

## 2023-02-20 DIAGNOSIS — I4891 Unspecified atrial fibrillation: Secondary | ICD-10-CM | POA: Diagnosis not present

## 2023-02-20 DIAGNOSIS — I2510 Atherosclerotic heart disease of native coronary artery without angina pectoris: Secondary | ICD-10-CM | POA: Diagnosis not present

## 2023-02-20 MED ORDER — ROSUVASTATIN CALCIUM 20 MG PO TABS: 20.0000 mg | ORAL_TABLET | Freq: Every day | ORAL | 3 refills | Status: DC

## 2023-02-20 MED ORDER — LOSARTAN POTASSIUM 50 MG PO TABS: 50.0000 mg | ORAL_TABLET | Freq: Every day | ORAL | 3 refills | Status: DC

## 2023-02-20 MED ORDER — METOPROLOL SUCCINATE ER 25 MG PO TB24: 25.0000 mg | ORAL_TABLET | Freq: Every day | ORAL | 3 refills | Status: DC

## 2023-02-20 NOTE — Patient Instructions (Signed)
Medication Instructions:  Your Physician recommend you continue on your current medication as directed.    *If you need a refill on your cardiac medications before your next appointment, please call your pharmacy*   Lab Work: None ordered at this time    Follow-Up: At [facility], you and your health needs are our priority.  As part of our continuing mission to provide you with exceptional heart care, we have created designated Provider Care Teams.  These Care Teams include your primary Cardiologist (physician) and Advanced Practice Providers (APPs -  Physician Assistants and Nurse Practitioners) who all work together to provide you with the care you need, when you need it.  We recommend signing up for the patient portal called "MyChart".  Sign up information is provided on this After Visit Summary.  MyChart is used to connect with patients for Virtual Visits (Telemedicine).  Patients are able to view lab/test results, encounter notes, upcoming appointments, etc.  Non-urgent messages can be sent to your provider as well.   To learn more about what you can do with MyChart, go to [email or web address].    Your next appointment:   3 month(s)  Provider:   You may see Yvonne Kendall, MD or one of the following Advanced Practice Providers on your designated Care Team:   Eula Listen, [place]

## 2023-02-20 NOTE — Progress Notes (Signed)
Cardiology Office Note    Date:  02/20/2023   ID:  Christine [name], DOB October 03, 1933, MRN [id number]  PCP:  Eden Emms, NP  Cardiologist:  Yvonne Kendall, MD  Electrophysiologist:  None   Chief Complaint: Follow up  History of Present Illness:   Christine [name] is a 89 y.o. female with history of CAD s/p four-vessel CABG in 12/2022 (LIMA to LAD, sequential SVG to OM1/OM 2, and SVG to distal RCA), postoperative A-fib/flutter, HTN, HLD, rheumatic fever, asthma, carotid artery disease, hypothyroidism, and GERD who presents for follow-up of CAD.   She previously established with cardiology in 02/2020 in the setting of orthopnea, lower extremity swelling, dizziness, fatigue, and orthostatic hypotension.  She reported at that time that she had previously undergone diagnostic cath in [place] over 20 years prior and believed it had shown nonobstructive disease.  Given symptoms, upon establishing with [place], echo showed an EF of 60 to 65% with no regional wall motion abnormalities, grade 1 diastolic dysfunction, and mild to moderate aortic valve sclerosis without evidence of stenosis.  Coronary CTA in 03/2020 showed a calcium score of 1844 (94th percentile) along with 50% proximal LAD stenosis and calcified stenosis in the RCA with normal FFR CT in the proximal RCA and a slow taper in the mid to distal segments from 0.8-0.6.  She was medically managed.  She underwent coronary CTA in 11/2022 that showed a calcium score of 20-80 with greater than 70% proximal to mid LAD stenosis, 25 to 49% left main and LCx stenosis, and 50 to 69% proximal RCA stenosis.  CT FFR abnormal in the LAD and RCA.  Subsequent LHC on 12/11/2022 showed significant multivessel CAD as outlined below.  She was admitted to [facility] and underwent four-vessel CABG in 12/13/2022.  Pre-CABG echo showed an EF of 60 to 65%, no regional wall motion abnormalities, grade 1 diastolic dysfunction, normal RV systolic function and  ventricular cavity size, no significant valvular abnormalities, and an estimated right atrial pressure of 3 mmHg.  Postoperative course was complicated by PAF/flutter with spontaneous conversion to sinus rhythm prior to discharge.  She was not started on anticoagulation due to concerns about acute blood loss anemia.  She was last seen in the office on 01/10/2023 noting ongoing indigestion that has been present since bypass surgery.  She also noted some improving shortness of breath with chest x-ray obtained at outside office the day prior showing persistent bilateral pleural effusions and consolidations/atelectasis.  She was prescribed furosemide, though had not started it yet.  She reported a diminished appetite.  It was recommended that she pick up her prescription for furosemide with consideration for thoracentesis at the discretion of CVTS upon follow-up.  She followed up with CVTS on 01/16/2023 with recommendation to increase Toprol to 25 mg daily and stop amiodarone after final 2 doses.  She comes in doing well from a cardiac perspective and is without symptoms of angina or cardiac decompensation.  She does continue to note some underlying fatigue, though is advancing activities as tolerated.  Continues to use her spirometer regularly.  No falls or symptoms concerning for bleeding.  Adherent and tolerating cardiac medications.  No lower extremity swelling.  Nausea resolved with discontinuation of amiodarone.  Appetite does remain somewhat diminished.  Her weight is down 9 pounds today when compared to her visit in 12/2022.  Overall, she feels like she is doing well and is pleased with her progress to date.   Labs independently reviewed: 01/2023 -  potassium 3.5, BUN 10, serum creatinine 0.94 12/2022  - Hgb 13.1, PLT 223, magnesium 1.8, albumin 3.1, AST/ALT normal, A1c 5.7, LP(a) 37.7 11/2022 -TC 152, TG 174, HDL 60, LDL 57 08/2021 - TSH normal   Past Medical History:  Diagnosis Date   Asthma     Carotid arterial disease (HCC)    a. 06/2019 Carotid U/S: RICA 50-69%. Mod LICA plaque.   Chicken pox    Diastolic dysfunction    a. 02/2020 Echo: EF 60-65%, no rwma, GrI DD. Nl RV size/fxn. Triv MR. Mild-mod Ao sclerosis w/o stenois.   Frequent headaches    GERD (gastroesophageal reflux disease)    Heart murmur    a. 02/2020 Echo: Triv MR. Mild-mod Ao sclerosis w/o stenosis.   Hyperlipidemia    Hypertension    Hypothyroidism    Nonobstructive CAD (coronary artery disease)    a.  Remote Cath in [facility] - reportedly nonobs dzs; b. 03/2020 Cor CTA:  Ca2+ = 1844 (94th %'ile). LAD 50p, RCA 25-49 (FFRct nl in prox RCA w/ slow taper in mid-dist segments from 0.8-->0.6).   Rheumatic fever    Urine incontinence     Past Surgical History:  Procedure Laterality Date   ABDOMINAL HYSTERECTOMY     APPENDECTOMY     BREAST BIOPSY     CORONARY ARTERY BYPASS GRAFT N/A 12/13/2022   Procedure: CORONARY ARTERY BYPASS GRAFTING (CABG) TIMES FOUR UTILIZING LEFT INTERNAL MAMMARY ARTERY AND ENDOSCOPIC VEIN HARVEST RIGHT GREATER SAPHENOUS VEIN;  Surgeon: Loreli Slot, MD;  Location: [facility] OR;  Service: Open Heart Surgery;  Laterality: N/A;   LEFT HEART CATH AND CORONARY ANGIOGRAPHY N/A 12/11/2022   Procedure: LEFT HEART CATH AND CORONARY ANGIOGRAPHY;  Surgeon: Yvonne Kendall, MD;  Location: [facility] INVASIVE CV LAB;  Service: Cardiovascular;  Laterality: N/A;   MASTECTOMY Bilateral 04/1979   TEE WITHOUT CARDIOVERSION N/A 12/13/2022   Procedure: TRANSESOPHAGEAL ECHOCARDIOGRAM;  Surgeon: Loreli Slot, MD;  Location: [facility];  Service: Open Heart Surgery;  Laterality: N/A;    Current Medications: Current Meds  Medication Sig   albuterol (VENTOLIN HFA) 108 (90 Base) MCG/ACT inhaler Inhale 2 puffs into the lungs every 6 (six) hours as needed for wheezing or shortness of breath.   aspirin EC 81 MG tablet Take 1 tablet (81 mg total) by mouth daily. Swallow whole.   fluticasone (FLONASE) 50 MCG/ACT nasal spray  Place 1 spray into both nostrils daily as needed for allergies or rhinitis.   fluticasone-salmeterol (WIXELA INHUB) 250-50 MCG/ACT AEPB Inhale 1 puff into the lungs in the morning and at bedtime. 2 puffs at bedtime   levothyroxine (SYNTHROID) 75 MCG tablet Take 1 tablet (75 mcg total) by mouth daily.   loratadine (CLARITIN) 10 MG tablet Take 10 mg by mouth daily as needed for allergies.   [DISCONTINUED] losartan (COZAAR) 50 MG tablet Take 1 tablet (50 mg total) by mouth daily.   [DISCONTINUED] metoprolol succinate (TOPROL-XL) 25 MG 24 hr tablet Take 1 tablet (25 mg total) by mouth daily.   [DISCONTINUED] rosuvastatin (CRESTOR) 20 MG tablet TAKE 1 TABLET DAILY    Allergies:   Atorvastatin and Pollen extract   Social History   Socioeconomic History   Marital status: Widowed    Spouse name: Not on file   Number of children: 4   Years of education: college   Highest education level: Not on file  Occupational History   Not on file  Tobacco Use   Smoking status: Never   Smokeless tobacco: Never  Vaping Use   Vaping status: Never Used  Substance and Sexual Activity   Alcohol use: Yes    Comment: maybe once a year   Drug use: Never   Sexual activity: Not Currently  Other Topics Concern   Not on file  Social History Narrative   06/03/19   From: [place] originally, moved here to be near daughter   Living: with daughter - Pamelia Hoit (Nori Riis)   Work: retired from Chemical engineer, [employer], site      Family: 4 children - Ronni and [place] in [place], daughter in [place] and has adopted daughter in [place]       Enjoys: play tennis, golf, walking, board games, gardening      Exercise: not currently - walking around the house   Diet: not as good as it should be, usually tries to be healthy, avoids fried foods, limits red meat      Safety   Seat belts: Yes    Guns: no   Safe in relationships: Yes    Social Determinants of Corporate investment banker Strain: Not on file  Food  Insecurity: No Food Insecurity (12/11/2022)   Hunger Vital Sign    Worried About Running Out of Food in the Last Year: Never true    Ran Out of Food in the Last Year: Never true  Transportation Needs: No Transportation Needs (12/11/2022)   PRAPARE - Administrator, Civil Service (Medical): No    Lack of Transportation (Non-Medical): No  Physical Activity: Not on file  Stress: Not on file  Social Connections: Not on file     Family History:  The patient's family history includes Early death (age of onset: 45) in her mother; Heart attack (age of onset: 34) in her brother; Heart disease in her brother; Heart disease (age of onset: 60) in her brother; Kidney disease in her mother; Leukemia in her daughter; Other in her father; Valvular heart disease in her daughter.  ROS:   12-point review of systems is negative unless otherwise noted in the HPI.   EKGs/Labs/Other Studies Reviewed:    Studies reviewed were summarized above. The additional studies were reviewed today:  Intraoperative TEE 12/13/2022: POST-OP IMPRESSIONS  _ Left Ventricle: The left ventricle is unchanged from pre-bypass. There  is  normal systolic function, with an estimad ejection fraction of 60%.  _ Right Ventricle: normal function.  _ Aorta: The aorta appears unchanged from pre-bypass.  _ Left Atrial Appendage: The left atrial appendage appears unchanged from  pre-bypass.  _ Aortic Valve: The aortic valve appears unchanged from pre-bypass.  _ Mitral Valve: There is mild regurgitation.  _ Tricuspid Valve: The tricuspid valve appears unchanged from pre-bypass.  _ Pulmonic Valve: The pulmonic valve appears unchanged from pre-bypass.  _ Comments:    Following coronary artery bypass grafting, the left ventricular systolic  function is normal. Right ventricular function is normal. There was mild  mitral  regurgitation post-bypass. The remainder of the valvular exam was  unchanged.  __________  Pre-CABG  vascular ultrasound 12/12/2022: Summary:  Right Carotid: Velocities in the right ICA are consistent with a 40-59% stenosis. The ECA appears >50% stenosed.   Left Carotid: Velocities in the left ICA are consistent with a 1-39%  stenosis.  Vertebrals: Bilateral vertebral arteries demonstrate antegrade flow.  Subclavians: Right subclavian artery was stenotic. Normal flow  hemodynamics were seen in the left subclavian artery.   Right ABI: Resting right ankle-brachial index is within normal range.  Left ABI:  Resting left ankle-brachial index is within normal range.  Bilateral Extremity: Doppler waveforms remain within normal limits with  compression bilaterally for the radial arteries. Doppler waveforms remain  within normal limits with compression bilaterally for the ulnar arteries.  __________  2D echo 12/12/2022: 1. Left ventricular ejection fraction, by estimation, is 60 to 65%. The  left ventricle has normal function. The left ventricle has no regional  wall motion abnormalities. Left ventricular diastolic parameters are  consistent with Grade I diastolic  dysfunction (impaired relaxation).   2. Right ventricular systolic function is normal. The right ventricular  size is normal. Tricuspid regurgitation signal is inadequate for assessing  PA pressure.   3. The mitral valve is normal in structure. No evidence of mitral valve  regurgitation. No evidence of mitral stenosis.   4. The aortic valve is tricuspid. Aortic valve regurgitation is not  visualized. No aortic stenosis is present.   5. The inferior vena cava is normal in size with greater than 50%  respiratory variability, suggesting right atrial pressure of 3 mmHg.  __________  [facility] 12/11/2022: Conclusions: Significant multivessel coronary artery disease, including sequential 60% ostial and 50% mid LAD stenoses, complex ostial LCx stenosis involving trifurcation of OM1, OM2, and LCx proper with hazy 90% stenosis, and diffuse  calcified RCA disease of up to 70%. Normal left ventricular systolic function (LVEF 55-65%) and filling pressure (LVEDP 10 mmHg).   Recommendations: Given severe multivessel CAD and continued vague chest pain at rest and with activity, we will admit [name] for medical optimization and cardiac surgery consultation for CABG. Continue aggressive secondary prevention of coronary artery disease. Obtain echocardiogram. __________  Coronary CTA 11/30/2022: FINDINGS: Aorta:  Normal size.  Aortic wall calcifications.  No dissection.   Aortic Valve:  Trileaflet.  No calcifications.   Coronary Arteries:  Normal coronary origin.  Right dominance.   RCA is a dominant artery. There is calcified plaque proximally causing moderate stenosis (50-69%).   Left main gives rise to LAD and LCX arteries. LM has calcified plaque proximally causing mild stenosis (25-49%).   LAD has heavily calcified plaque proximally causing severe stenosis (>70%).   LCX is a non-dominant artery. There is calcified plaque proximally causing mild stenosis (25-49%).   Other findings:   Normal pulmonary vein drainage into the left atrium.   Normal left atrial appendage without a thrombus.   Normal size of the pulmonary artery.   IMPRESSION: 1. Coronary calcium score of 2280.   2. Normal coronary origin with right dominance.   3. Severe proximal-mid LAD stenosis (>70%).   4. Moderate proximal RCA stenosis (50-69%).   5. Mild LM and LCx stenosis (25-49%).   6. CAD-RADS 4 Severe stenosis. (70-99% or > 50% left main). Cardiac catheterization is recommended. Consider symptom-guided anti-ischemic pharmacotherapy as well as risk factor modification per guideline directed care.   7. Additional analysis with CT FFR will be submitted and reported separately.   ctFFR: 1. Left Main:  No significant stenosis.   2. LAD: significant stenosis in mid LAD.  FFRct 0.73 3. LCX: No significant stenosis  (borderline).  FFRct 0.80 4. RCA: significant stenosis in proximal RCA.  FFRct 0.75   IMPRESSION: 1. CT FFR analysis showed significant stenosis in mid LAD and proximal RCA.   2.  Cardiac catheterization recommended. __________  Carotid artery ultrasound 06/06/2022: Summary:  Right Carotid: Velocities in the right ICA are consistent with a 40-59% stenosis. Non-hemodynamically significant plaque <50% noted in the CCA. The ECA appears >50% stenosed.  Left Carotid: Velocities in the left ICA are consistent with a 1-39%  stenosis. Non-hemodynamically significant plaque <50% noted in the  CCA. The ECA appears >50% stenosed.   Vertebrals:  Bilateral vertebral arteries demonstrate antegrade flow.  Subclavians: Bilateral subclavian arteries were stenotic.  __________   Coronary CTA with CT FFR 04/08/2020: Aorta: Normal size. Mild aortic root wall calcifications. No dissection.   Aortic Valve:  Trileaflet.  No calcifications.   Coronary Arteries:  Normal coronary origin.  Right dominance.   RCA is a dominant artery that gives rise to PDA and PLA. There is calcified plaque throughout the RCA causing mild stenosis (25-49%).   Left main is a large artery that gives rise to LAD and LCX arteries. There is mild calcification in the mid to distal LM causing minimal stenosis (<25%).   LAD is heavily calcified in the proximal to mid segments causing moderate (50%) stenosis in the proximal segment and mild stenosis (25-49%) in the mid segment.   LCX is a non-dominant artery that gives rise to one obtuse marginal branch. There is no plaque.   Other findings:   Normal pulmonary vein drainage into the left atrium.   Normal left atrial appendage without a thrombus.   Normal size of the pulmonary artery.   IMPRESSION: 1. High coronary calcium score of 1844. This was 94th percentile for age and sex matched control. 2. Normal coronary origin with right dominance. 3. Calcified plaque  causing moderate stenosis (50%) in the proximal LAD. 4. Calcified plaque causing mild (25-49%) stenosis throughout the RCA and mid LAD. 5. CAD-RADS 3. Moderate stenosis. Consider symptom-guided anti-ischemic pharmacotherapy as well as risk factor modification per guideline directed care. 6. Additional analysis with CT FFR will be submitted and reported separately.   CT FFR: 1. Left Main:  No significant stenosis.   2. LAD: No significant stenosis. 3. LCX: No significant stenosis. 4. RCA: No significant focal stenosis. FFRct normal in the proximal RCA segment with slow taper in the mid to distal segments from 0.8 to 0.6 distally.   IMPRESSION: 1. CT FFR analysis didn't show any significant focal stenosis in the LAD or LCx. 2.  Slow taper in FFRct values in the RCA with no focal stenosis. 3.  Recommend medical management. __________   2D echo 03/09/2020: 1. Left ventricular ejection fraction, by estimation, is 60 to 65%. The  left ventricle has normal function. The left ventricle has no regional  wall motion abnormalities. Left ventricular diastolic parameters are  consistent with Grade I diastolic  dysfunction (impaired relaxation).   2. Right ventricular systolic function is normal. The right ventricular  size is normal. Tricuspid regurgitation signal is inadequate for assessing  PA pressure.   3. The mitral valve is degenerative. Trivial mitral valve regurgitation.  No evidence of mitral stenosis.   4. The aortic valve has an indeterminant number of cusps. There is mild  calcification of the aortic valve. There is moderate thickening of the  aortic valve. Aortic valve regurgitation is trivial. Mild to moderate  aortic valve sclerosis/calcification is  present, without any evidence of aortic stenosis.   5. The inferior vena cava is dilated in size with >50% respiratory  variability, suggesting right atrial pressure of 8 mmHg.   EKG:  EKG is not ordered today.    Recent  Labs: 12/13/2022: ALT 17 12/16/2022: Magnesium 1.8 01/09/2023: Hemoglobin 13.1; Platelets 223.0 01/17/2023: BUN 10; Creatinine, Ser 0.94; Potassium 3.5; Sodium 139  Recent Lipid Panel    Component Value  Date/Time   CHOL 152 11/14/2022 1108   CHOL 182 09/08/2020 1045   TRIG 174 (H) 11/14/2022 1108   HDL 60 11/14/2022 1108   HDL 57 09/08/2020 1045   CHOLHDL 2.5 11/14/2022 1108   VLDL 35 11/14/2022 1108   LDLCALC 57 11/14/2022 1108   LDLCALC 93 09/08/2020 1045   LDLDIRECT 116.0 02/12/2020 1233    PHYSICAL EXAM:    VS:  BP 138/70 (BP Location: Left Arm, Patient Position: Sitting, Cuff Size: Normal)   Pulse 67   Ht 5\' 5"  (1.651 m)   Wt 157 lb 9.6 oz (71.5 kg)   SpO2 97%   BMI 26.23 kg/m   BMI: Body mass index is 26.23 kg/m.  Physical Exam Vitals reviewed.  Constitutional:      Appearance: She is well-developed.  HENT:     Head: Normocephalic and atraumatic.  Eyes:     General:        Right eye: No discharge.        Left eye: No discharge.  Neck:     Vascular: No JVD.  Cardiovascular:     Rate and Rhythm: Normal rate and regular rhythm.     Pulses:          Posterior tibial pulses are 2+ on the right side and 2+ on the left side.     Heart sounds: Normal heart sounds, S1 normal and S2 normal. Heart sounds not distant. No midsystolic click and no opening snap. No murmur heard.    No friction rub.  Pulmonary:     Effort: Pulmonary effort is normal. No respiratory distress.     Breath sounds: Normal breath sounds. No decreased breath sounds, wheezing, rhonchi or rales.  Chest:     Chest wall: No tenderness.  Abdominal:     General: There is no distension.  Musculoskeletal:     Cervical back: Normal range of motion.     Right lower leg: No edema.     Left lower leg: No edema.  Skin:    General: Skin is warm and dry.     Nails: There is no clubbing.  Neurological:     Mental Status: She is alert and oriented to person, place, and time.  Psychiatric:        Speech:  Speech normal.        Behavior: Behavior normal.        Thought Content: Thought content normal.        Judgment: Judgment normal.     Wt Readings from Last 3 Encounters:  02/20/23 157 lb 9.6 oz (71.5 kg)  02/14/23 163 lb (73.9 kg)  02/01/23 164 lb (74.4 kg)     ASSESSMENT & PLAN:   Multivessel CAD status post CABG: She is without symptoms of angina or cardiac decompensation.  Continue aggressive risk factor modification and secondary prevention with aspirin 81 mg, Toprol-XL 25 mg, and rosuvastatin 20 mg.  Postoperative A-fib: Maintaining sinus rhythm by physical exam.  Has not been maintained on anticoagulation given concern for anemia.  Remains on Toprol-XL.  Pleural effusions: Resolved status post outpatient diuresis.  HTN: Blood pressure is well-controlled in the office today.  She remains on losartan 50 mg and Toprol-XL 25 mg.  HLD: LDL 57.  She remains on rosuvastatin 20 mg.     Disposition: F/u with Dr. Okey Dupre or an APP in 3 months.   Medication Adjustments/Labs and Tests Ordered: Current medicines are reviewed at length with the patient today.  Concerns regarding  medicines are outlined above. Medication changes, Labs and Tests ordered today are summarized above and listed in the Patient Instructions accessible in Encounters.   Signed, Eula Listen, PA-C 02/20/2023 2:22 PM      [facility] [address] [place], [place] [address] [phone]

## 2023-02-21 ENCOUNTER — Telehealth (HOSPITAL_COMMUNITY): Payer: Self-pay

## 2023-02-21 NOTE — Telephone Encounter (Signed)
Pt is not interested in the cardiac rehab at this time. ?Closed referral. ?

## 2023-02-22 DIAGNOSIS — Z08 Encounter for follow-up examination after completed treatment for malignant neoplasm: Secondary | ICD-10-CM | POA: Diagnosis not present

## 2023-02-22 DIAGNOSIS — D2272 Melanocytic nevi of left lower limb, including hip: Secondary | ICD-10-CM | POA: Diagnosis not present

## 2023-02-22 DIAGNOSIS — D2262 Melanocytic nevi of left upper limb, including shoulder: Secondary | ICD-10-CM | POA: Diagnosis not present

## 2023-02-22 DIAGNOSIS — D2271 Melanocytic nevi of right lower limb, including hip: Secondary | ICD-10-CM | POA: Diagnosis not present

## 2023-02-22 DIAGNOSIS — Z85828 Personal history of other malignant neoplasm of skin: Secondary | ICD-10-CM | POA: Diagnosis not present

## 2023-02-22 DIAGNOSIS — D225 Melanocytic nevi of trunk: Secondary | ICD-10-CM | POA: Diagnosis not present

## 2023-02-22 DIAGNOSIS — D2261 Melanocytic nevi of right upper limb, including shoulder: Secondary | ICD-10-CM | POA: Diagnosis not present

## 2023-02-22 DIAGNOSIS — L821 Other seborrheic keratosis: Secondary | ICD-10-CM | POA: Diagnosis not present

## 2023-03-02 ENCOUNTER — Ambulatory Visit (INDEPENDENT_AMBULATORY_CARE_PROVIDER_SITE_OTHER): Payer: Medicare Other

## 2023-03-02 VITALS — BP 110/68 | Ht 65.0 in | Wt 160.4 lb

## 2023-03-02 DIAGNOSIS — Z23 Encounter for immunization: Secondary | ICD-10-CM

## 2023-03-02 DIAGNOSIS — Z0000 Encounter for general adult medical examination without abnormal findings: Secondary | ICD-10-CM

## 2023-03-02 NOTE — Patient Instructions (Addendum)
Christine [name] , Thank you for taking time to come for your Medicare Wellness Visit. I appreciate your ongoing commitment to your health goals. Please review the following plan we discussed and let me know if I can assist you in the future.   Referrals/Orders/Follow-Ups/Clinician Recommendations: none  This is a list of the screening recommended for you and due dates:  Health Maintenance  Topic Date Due   Zoster (Shingles) Vaccine (1 of 2) Never done   DEXA scan (bone density measurement)  Never done   Flu Shot  06/11/2023*   COVID-19 Vaccine (3 - Moderna risk series) 02/14/2024*   Medicare Annual Wellness Visit  03/01/2024   DTaP/Tdap/Td vaccine (2 - Td or Tdap) 05/08/2026   Pneumonia Vaccine  Completed   HPV Vaccine  Aged Out  *Topic was postponed. The date shown is not the original due date.    Advanced directives: (Copy Requested) Please bring a copy of your health care power of attorney and living will to the office to be added to your chart at your convenience.  Next Medicare Annual Wellness Visit scheduled for next year: Yes 03/04/24 @ 11:30am televisit

## 2023-03-02 NOTE — Progress Notes (Signed)
Subjective:   Christine [name] is a 89 y.o. female who presents for Medicare Annual (Subsequent) preventive examination.  Visit Complete: In person  Patient Medicare AWV questionnaire was completed by the patient on (not done); I have confirmed that all information answered by patient is correct and no changes since this date.  Cardiac Risk Factors include: advanced age (>10men, >85 women);dyslipidemia;hypertension    Objective:    Today's Vitals   03/02/23 1053  BP: 110/68  Weight: 160 lb 6.4 oz (72.8 kg)  Height: 5\' 5"  (1.651 m)   Body mass index is 26.69 kg/m.     03/02/2023   11:10 AM 12/13/2022    5:16 AM 12/11/2022    8:25 AM 12/05/2021   11:39 AM 11/30/2021    1:27 PM 12/01/2020   10:45 AM  Advanced Directives  Does Patient Have a Medical Advance Directive? Yes  No Yes No Yes  Type of Advance Directive Living will;Healthcare Power of Attorney   Living will  Living will  Does patient want to make changes to medical advance directive?    No - Patient declined  No - Patient declined  Copy of Healthcare Power of Attorney in Chart? No - copy requested       Would patient like information on creating a medical advance directive?  No - Patient declined   No - Patient declined     Current Medications (verified) Outpatient Encounter Medications as of 03/02/2023  Medication Sig   albuterol (VENTOLIN HFA) 108 (90 Base) MCG/ACT inhaler Inhale 2 puffs into the lungs every 6 (six) hours as needed for wheezing or shortness of breath.   aspirin EC 81 MG tablet Take 1 tablet (81 mg total) by mouth daily. Swallow whole.   fluticasone (FLONASE) 50 MCG/ACT nasal spray Place 1 spray into both nostrils daily as needed for allergies or rhinitis.   fluticasone-salmeterol (WIXELA INHUB) 250-50 MCG/ACT AEPB Inhale 1 puff into the lungs in the morning and at bedtime. 2 puffs at bedtime   levothyroxine (SYNTHROID) 75 MCG tablet Take 1 tablet (75 mcg total) by mouth daily.   loratadine  (CLARITIN) 10 MG tablet Take 10 mg by mouth daily as needed for allergies.   losartan (COZAAR) 50 MG tablet Take 1 tablet (50 mg total) by mouth daily.   metoprolol succinate (TOPROL-XL) 25 MG 24 hr tablet Take 1 tablet (25 mg total) by mouth daily.   rosuvastatin (CRESTOR) 20 MG tablet Take 1 tablet (20 mg total) by mouth daily.   No facility-administered encounter medications on file as of 03/02/2023.   Allergies (verified) Atorvastatin and Pollen extract   History: Past Medical History:  Diagnosis Date   Asthma    Carotid arterial disease (HCC)    a. 06/2019 Carotid U/S: RICA 50-69%. Mod LICA plaque.   Chicken pox    Diastolic dysfunction    a. 02/2020 Echo: EF 60-65%, no rwma, GrI DD. Nl RV size/fxn. Triv MR. Mild-mod Ao sclerosis w/o stenois.   Frequent headaches    GERD (gastroesophageal reflux disease)    Heart murmur    a. 02/2020 Echo: Triv MR. Mild-mod Ao sclerosis w/o stenosis.   Hyperlipidemia    Hypertension    Hypothyroidism    Nonobstructive CAD (coronary artery disease)    a.  Remote Cath in [facility] - reportedly nonobs dzs; b. 03/2020 Cor CTA:  Ca2+ = 1844 (94th %'ile). LAD 50p, RCA 25-49 (FFRct nl in prox RCA w/ slow taper in mid-dist segments from 0.8-->0.6).  Rheumatic fever    Urine incontinence    Past Surgical History:  Procedure Laterality Date   ABDOMINAL HYSTERECTOMY     APPENDECTOMY     BREAST BIOPSY     CORONARY ARTERY BYPASS GRAFT N/A 12/13/2022   Procedure: CORONARY ARTERY BYPASS GRAFTING (CABG) TIMES FOUR UTILIZING LEFT INTERNAL MAMMARY ARTERY AND ENDOSCOPIC VEIN HARVEST RIGHT GREATER SAPHENOUS VEIN;  Surgeon: Loreli Slot, MD;  Location: [facility] OR;  Service: Open Heart Surgery;  Laterality: N/A;   LEFT HEART CATH AND CORONARY ANGIOGRAPHY N/A 12/11/2022   Procedure: LEFT HEART CATH AND CORONARY ANGIOGRAPHY;  Surgeon: Yvonne Kendall, MD;  Location: [facility] INVASIVE CV LAB;  Service: Cardiovascular;  Laterality: N/A;   MASTECTOMY Bilateral 04/1979   TEE  WITHOUT CARDIOVERSION N/A 12/13/2022   Procedure: TRANSESOPHAGEAL ECHOCARDIOGRAM;  Surgeon: Loreli Slot, MD;  Location: [facility] OR;  Service: Open Heart Surgery;  Laterality: N/A;   Family History  Problem Relation Age of Onset   Early death Mother 24   Kidney disease Mother        bright disease   Other Father        tumor on the side of his leg   Heart disease Brother    Heart attack Brother 9   Heart disease Brother 4   Leukemia Daughter    Valvular heart disease Daughter        s/p mitral valve replacement   Social History   Socioeconomic History   Marital status: Widowed    Spouse name: Not on file   Number of children: 4   Years of education: college   Highest education level: Not on file  Occupational History   Not on file  Tobacco Use   Smoking status: Never   Smokeless tobacco: Never  Vaping Use   Vaping status: Never Used  Substance and Sexual Activity   Alcohol use: Yes    Comment: maybe once a year   Drug use: Never   Sexual activity: Not Currently  Other Topics Concern   Not on file  Social History Narrative   06/03/19   From: [place] originally, moved here to be near daughter   Living: with daughter - Pamelia Hoit (Nori Riis)   Work: retired from Chemical engineer, Engineer, site      Family: 4 children - Ronni and Bratenahl in [place], daughter in [place] and has adopted daughter in [place]       Enjoys: play tennis, golf, walking, board games, gardening      Exercise: not currently - walking around the house   Diet: not as good as it should be, usually tries to be healthy, avoids fried foods, limits red meat      Safety   Seat belts: Yes    Guns: no   Safe in relationships: Yes    Social Drivers of Corporate investment banker Strain: Low Risk  (03/02/2023)   Overall Financial Resource Strain (CARDIA)    Difficulty of Paying Living Expenses: Not hard at all  Food Insecurity: No Food Insecurity (03/02/2023)   Hunger Vital Sign    Worried About  Running Out of Food in the Last Year: Never true    Ran Out of Food in the Last Year: Never true  Transportation Needs: No Transportation Needs (03/02/2023)   PRAPARE - Administrator, Civil Service (Medical): No    Lack of Transportation (Non-Medical): No  Physical Activity: Inactive (03/02/2023)   Exercise Vital Sign    Days of Exercise  per Week: 0 days    Minutes of Exercise per Session: 0 min  Stress: No Stress Concern Present (03/02/2023)   [employer] of Occupational Health - Occupational Stress Questionnaire    Feeling of Stress : Only a little  Social Connections: Moderately Isolated (03/02/2023)   Social Connection and Isolation Panel [NHANES]    Frequency of Communication with Friends and Family: More than three times a week    Frequency of Social Gatherings with Friends and Family: More than three times a week    Attends Religious Services: More than 4 times per year    Active Member of [employer] or Organizations: No    Attends Banker Meetings: Never    Marital Status: Widowed    Tobacco Counseling Counseling given: Not Answered  Clinical Intake:  Pre-visit preparation completed: No  Pain : No/denies pain   BMI - recorded: 26.69 Nutritional Status: BMI 25 -29 Overweight Nutritional Risks: None Diabetes: No  How often do you need to have someone help you when you read instructions, pamphlets, or other written materials from your doctor or pharmacy?: 1 - Never  Interpreter Needed?: No  Comments: lives with daughter Information entered by :: B.Takuma Cifelli,LPN   Activities of Daily Living    03/02/2023   11:11 AM 12/11/2022    8:08 PM  In your present state of health, do you have any difficulty performing the following activities:  Hearing? 0   Vision? 0   Difficulty concentrating or making decisions? 0   Walking or climbing stairs? 0   Dressing or bathing? 0   Doing errands, shopping? 0 0  Preparing Food and eating ? N   Using  the Toilet? N   In the past six months, have you accidently leaked urine? N   Do you have problems with loss of bowel control? N   Managing your Medications? N   Managing your Finances? N   Housekeeping or managing your Housekeeping? N     Patient Care Team: Eden Emms, NP as PCP - General (Nurse Practitioner) End, Cristal Deer, MD as PCP - Cardiology (Cardiology) End, Cristal Deer, MD as Consulting Physician (Cardiology)  Indicate any recent Medical Services you may have received from other than [facility] providers in the past year (date may be approximate).     Assessment:   This is a routine wellness examination for Emiline.  Hearing/Vision screen Hearing Screening - Comments:: Pt says her hearing is alright Vision Screening - Comments:: Pt says she wears readers only (non rx); cataracts removed 2012 Does not want referral-going back to [place]   Goals Addressed             This Visit's Progress    Patient Stated   On track    03/02/23 pt will Continue self care     COMPLETED: Weight (lb) < 160 lb (72.6 kg)   160 lb 6.4 oz (72.8 kg)      Depression Screen    03/02/2023   11:05 AM 01/09/2023   11:59 AM 10/20/2022   10:47 AM 08/15/2022    9:42 AM 05/12/2022   10:07 AM 12/05/2021    2:39 PM 09/06/2021   12:12 PM  PHQ 2/9 Scores  PHQ - 2 Score 0 1 1 2  0 2 1  PHQ- 9 Score  4 4 6 6 8 12     Fall Risk    03/02/2023   10:59 AM 02/14/2023   11:48 AM 10/20/2022   10:47 AM 05/12/2022   10:06  AM 12/05/2021   11:25 AM  Fall Risk   Falls in the past year? 1 0 1 1 1   Number falls in past yr: 0 0 1 1 1   Injury with Fall? 0 0 0 0 0  Risk for fall due to : No Fall Risks No Fall Risks History of fall(s) No Fall Risks Impaired balance/gait  Follow up Education provided;Falls prevention discussed Falls evaluation completed Falls evaluation completed Falls evaluation completed     MEDICARE RISK AT HOME: Medicare Risk at Home Any stairs in or around the home?: Yes If so, are there any  without handrails?: Yes Home free of loose throw rugs in walkways, pet beds, electrical cords, etc?: Yes Adequate lighting in your home to reduce risk of falls?: Yes Life alert?: No Use of a cane, walker or w/c?: No Grab bars in the bathroom?: Yes Shower chair or bench in shower?: Yes Elevated toilet seat or a handicapped toilet?: Yes  TIMED UP AND GO:  Was the test performed?  Yes  Length of time to ambulate 10 feet: 12 sec Gait steady and fast without use of assistive device    Cognitive Function:      03/02/2020    2:06 PM  Montreal Cognitive Assessment   Visuospatial/ Executive (0/5) 3  Naming (0/3) 3  Attention: Read list of digits (0/2) 2  Attention: Read list of letters (0/1) 1  Attention: Serial 7 subtraction starting at 100 (0/3) 3  Language: Repeat phrase (0/2) 2  Language : Fluency (0/1) 1  Abstraction (0/2) 2  Delayed Recall (0/5) 5  Orientation (0/6) 6  Total 28      03/02/2023   11:13 AM  6CIT Screen  What Year? 0 points  What month? 0 points  What time? 0 points  Count back from 20 0 points  Months in reverse 0 points  Repeat phrase 0 points  Total Score 0 points    Immunizations Immunization History  Administered Date(s) Administered   Fluad Quad(high Dose 65+) 12/12/2018, 12/04/2019, 12/01/2020   Fluad Trivalent(High Dose 65+) 03/02/2023   Influenza, High Dose Seasonal PF 12/10/2017   Influenza-Unspecified 01/16/2022   Moderna Sars-Covid-2 Vaccination 12/04/2019, 01/01/2020   Pneumococcal Conjugate-13 01/20/2015, 05/17/2017   Pneumococcal Polysaccharide-23 11/11/2013   Tdap 05/08/2016    TDAP status: Up to date  Flu Vaccine status: Completed at today's visit  Pneumococcal vaccine status: Up to date  Covid-19 vaccine status: Completed vaccines  Qualifies for Shingles Vaccine? Yes   Zostavax completed No   Shingrix Completed?: No.    Education has been provided regarding the importance of this vaccine. Patient has been advised to  call insurance company to determine out of pocket expense if they have not yet received this vaccine. Advised may also receive vaccine at local pharmacy or Health Dept. Verbalized acceptance and understanding.  Screening Tests Health Maintenance  Topic Date Due   Zoster Vaccines- Shingrix (1 of 2) Never done   DEXA SCAN  Never done   COVID-19 Vaccine (3 - Moderna risk series) 02/14/2024 (Originally 01/29/2020)   Medicare Annual Wellness (AWV)  03/01/2024   DTaP/Tdap/Td (2 - Td or Tdap) 05/08/2026   Pneumonia Vaccine 73+ Years old  Completed   INFLUENZA VACCINE  Completed   HPV VACCINES  Aged Out    Health Maintenance  Health Maintenance Due  Topic Date Due   Zoster Vaccines- Shingrix (1 of 2) Never done   DEXA SCAN  Never done    Colorectal cancer screening: No  longer required.   Mammogram status: No longer required due to age.  Lung Cancer Screening: (Low Dose CT Chest recommended if Age 53-80 years, 20 pack-year currently smoking OR have quit w/in 15years.) does not qualify.   Lung Cancer Screening Referral: no  Additional Screening:  Hepatitis C Screening: does not qualify; Completed no  Vision Screening: Recommended annual ophthalmology exams for early detection of glaucoma and other disorders of the eye. Is the patient up to date with their annual eye exam?  No  Who is the provider or what is the name of the office in which the patient attends annual eye exams? none If pt is not established with a provider, would they like to be referred to a provider to establish care? No .   Dental Screening: Recommended annual dental exams for proper oral hygiene  Diabetic Foot Exam: n/a  Community Resource Referral / Chronic Care Management: CRR required this visit?  No   CCM required this visit?  No    Plan:     I have personally reviewed and noted the following in the patient's chart:   Medical and social history Use of alcohol, tobacco or illicit drugs  Current  medications and supplements including opioid prescriptions. Patient is not currently taking opioid prescriptions. Functional ability and status Nutritional status Physical activity Advanced directives List of other physicians Hospitalizations, surgeries, and ER visits in previous 12 months Vitals Screenings to include cognitive, depression, and falls Referrals and appointments  In addition, I have reviewed and discussed with patient certain preventive protocols, quality metrics, and best practice recommendations. A written personalized care plan for preventive services as well as general preventive health recommendations were provided to patient.    Sue Lush, LPN   [id number]   After Visit Summary: (MyChart) Due to this being a telephonic visit, the after visit summary with patients personalized plan was offered to patient via MyChart   Nurse Notes: The patient states she is doing well and has no concerns or questions at this time.  *Influenza vaccine given at today's visit

## 2023-03-12 ENCOUNTER — Ambulatory Visit: Payer: Self-pay | Admitting: Nurse Practitioner

## 2023-03-12 NOTE — Telephone Encounter (Signed)
Noted. Will evaluate in office  

## 2023-03-12 NOTE — Telephone Encounter (Signed)
  Chief Complaint: productive cough Symptoms: nausea, runny nose Frequency: ongoing since Saturday Pertinent Negatives: Patient denies fever or shortness of breath Disposition: [] ED /[] Urgent Care (no appt availability in office) / [x] Appointment(In office/virtual)/ []  [facility]/ [] Home Care/ [] Refused Recommended Disposition /[] [facility]/ []  Follow-up with PCP Additional Notes: The patient reported a cough that started Saturday and has since worsened. At first it was a dry cough and now it is productive.  She said, "I had a Quadruple bypass in October and they warned me about getting a cold."  She has had some nausea and has thrown up last night and this morning.  Her cough is productive and the sputum is green.  She has a runny nose.  She denied a fever or worsening shortness of breath.  She was scheduled for an appointment 03/13/23 for further evaluation.   Reason for Disposition  [1] Continuous (nonstop) coughing interferes with work or school AND [2] no improvement using cough treatment per Care Advice  Answer Assessment - Initial Assessment Questions 1. ONSET: "When did the cough begin?"      3 days; Saturday 2. SEVERITY: "How bad is the cough today?"      Coughing fits throughout the day  3. SPUTUM: "Describe the color of your sputum" (none, dry cough; clear, white, yellow, green)     Yellowish  4. HEMOPTYSIS: "Are you coughing up any blood?" If so ask: "How much?" (flecks, streaks, tablespoons, etc.)     No  5. DIFFICULTY BREATHING: "Are you having difficulty breathing?" If Yes, ask: "How bad is it?" (e.g., mild, moderate, severe)    - MILD: No SOB at rest, mild SOB with walking, speaks normally in sentences, can lie down, no retractions, pulse < 100.    - MODERATE: SOB at rest, SOB with minimal exertion and prefers to sit, cannot lie down flat, speaks in phrases, mild retractions, audible wheezing, pulse 100-120.    - SEVERE: Very SOB at rest, speaks in  single words, struggling to breathe, sitting hunched forward, retractions, pulse > 120      No more than usual  6. FEVER: "Do you have a fever?" If Yes, ask: "What is your temperature, how was it measured, and when did it start?"     No  7. CARDIAC HISTORY: "Do you have any history of heart disease?" (e.g., heart attack, congestive heart failure)      Bypass  8. LUNG HISTORY: "Do you have any history of lung disease?"  (e.g., pulmonary embolus, asthma, emphysema)     asthma 9. PE RISK FACTORS: "Do you have a history of blood clots?" (or: recent major surgery, recent prolonged travel, bedridden)     Recent surgery  10. OTHER SYMPTOMS: "Do you have any other symptoms?" (e.g., runny nose, wheezing, chest pain)       Runny nose  Protocols used: Cough - Acute Productive-A-AH

## 2023-03-12 NOTE — Telephone Encounter (Signed)
Copied from CRM [id number]. Topic: Clinical - Pink Word Triage >> Mar 12, 2023 11:26 AM Alvino Blood C wrote: Reason for Triage: Patient is having serious cough spells, feels sick and would like an antibiotic called in.

## 2023-03-13 ENCOUNTER — Ambulatory Visit (INDEPENDENT_AMBULATORY_CARE_PROVIDER_SITE_OTHER): Payer: Medicare Other | Admitting: Nurse Practitioner

## 2023-03-13 VITALS — BP 110/70 | HR 76 | Temp 98.6°F | Ht 65.0 in | Wt 161.0 lb

## 2023-03-13 DIAGNOSIS — J0110 Acute frontal sinusitis, unspecified: Secondary | ICD-10-CM | POA: Diagnosis not present

## 2023-03-13 DIAGNOSIS — R051 Acute cough: Secondary | ICD-10-CM | POA: Insufficient documentation

## 2023-03-13 LAB — POC COVID19 BINAXNOW: SARS Coronavirus 2 Ag: NEGATIVE

## 2023-03-13 MED ORDER — AMOXICILLIN-POT CLAVULANATE 875-125 MG PO TABS: 1.0000 | ORAL_TABLET | Freq: Two times a day (BID) | ORAL | 0 refills | Status: AC

## 2023-03-13 MED ORDER — BENZONATATE 100 MG PO CAPS: 100.0000 mg | ORAL_CAPSULE | Freq: Three times a day (TID) | ORAL | 0 refills | Status: DC | PRN

## 2023-03-13 NOTE — Patient Instructions (Signed)
Nice to see you today Covid test was negative I have sent antibiotics and cough medication to the pharmacy  Follow up if you do not improve

## 2023-03-13 NOTE — Assessment & Plan Note (Signed)
COVID test negative in office.  She with Augmentin 75-125 mg twice daily for 7 days.  Follow-up if no improvement

## 2023-03-13 NOTE — Progress Notes (Signed)
 Acute Office Visit  Subjective:     Patient ID: Christine Virginia  [name], female    DOB: [birth year], 89 y.o.   MRN: [id number]  Chief Complaint  Patient presents with   Cough    Complains of consistent cough that started Thursday or Friday.  No sore throat or fever.      Patient is in today for cough with a history of CABG, HTN, Asthma, GERD  Symptoms started on Thursday. She felt like yuck She started with a cough and then on Sunday it got bad. States she coughed until she threw up . States that she did vomit 4 times. States that she has not done that since yesterday morning around 11 AM  States that she did have sinus pressure at the beginning of the week (last Monday) and used mucinex sinus. States that she tried nyqull with some relief   Review of Systems  Constitutional:  Positive for malaise/fatigue. Negative for chills and fever.  HENT:  Positive for ear pain (full) and sinus pain. Negative for sore throat.   Respiratory:  Positive for cough, sputum production and shortness of breath.   Gastrointestinal:  Positive for vomiting. Negative for abdominal pain, diarrhea and nausea.  Musculoskeletal:  Negative for myalgias.  Neurological:  Positive for headaches.        Objective:    BP 110/70   Pulse 76   Temp 98.6 F (37 C) (Oral)   Ht 5' 5 (1.651 m)   Wt 161 lb (73 kg)   SpO2 96%   BMI 26.79 kg/m    Physical Exam Vitals and nursing note reviewed.  Constitutional:      Appearance: Normal appearance.  HENT:     Right Ear: Tympanic membrane, ear canal and external ear normal.     Left Ear: Tympanic membrane, ear canal and external ear normal.     Nose:     Right Sinus: Frontal sinus tenderness present. No maxillary sinus tenderness.     Left Sinus: Frontal sinus tenderness present. No maxillary sinus tenderness.     Mouth/Throat:     Mouth: Mucous membranes are moist.     Pharynx: Oropharynx is clear.  Cardiovascular:     Rate and Rhythm: Normal rate  and regular rhythm.     Heart sounds: Normal heart sounds.  Pulmonary:     Effort: Pulmonary effort is normal.     Breath sounds: Normal breath sounds.  Lymphadenopathy:     Cervical: No cervical adenopathy.  Neurological:     Mental Status: She is alert.     Results for orders placed or performed in visit on 03/13/23  POC COVID-19 BinaxNow  Result Value Ref Range   SARS Coronavirus 2 Ag Negative Negative        Assessment & Plan:   Problem List Items Addressed This Visit       Respiratory   Acute non-recurrent frontal sinusitis - Primary   COVID test negative in office.  She with Augmentin  75-125 mg twice daily for 7 days.  Follow-up if no improvement      Relevant Medications   amoxicillin -clavulanate (AUGMENTIN ) 875-125 MG tablet   benzonatate  (TESSALON ) 100 MG capsule     Other   Acute cough   COVID test in office.  Can use Tessalon  Perles 100 mg 3 times daily as needed cough      Relevant Medications   benzonatate  (TESSALON ) 100 MG capsule   Other Relevant Orders   POC COVID-19 BinaxNow (Completed)  Meds ordered this encounter  Medications   amoxicillin -clavulanate (AUGMENTIN ) 875-125 MG tablet    Sig: Take 1 tablet by mouth 2 (two) times daily for 7 days.    Dispense:  14 tablet    Refill:  0    Supervising Provider:   RANDEEN HARDY A [[id number]]   benzonatate  (TESSALON ) 100 MG capsule    Sig: Take 1 capsule (100 mg total) by mouth 3 (three) times daily as needed.    Dispense:  21 capsule    Refill:  0    Supervising Provider:   RANDEEN HARDY A [[id number]]    Return if symptoms worsen or fail to improve.  Adina Crandall, NP

## 2023-03-13 NOTE — Assessment & Plan Note (Signed)
COVID test in office.  Can use Tessalon Perles 100 mg 3 times daily as needed cough

## 2023-04-05 ENCOUNTER — Ambulatory Visit: Payer: Medicare Other | Admitting: Internal Medicine

## 2023-04-26 ENCOUNTER — Telehealth: Payer: Self-pay | Admitting: Nurse Practitioner

## 2023-04-26 NOTE — Telephone Encounter (Signed)
Patient dropped off document Handicap Placard, to be filled out by provider. Patient requested to send it back via Call Patient to pick up within ASAP. Document is located in providers tray at front office.Please advise at [facility] [phone].   Patient is needing this by tomorrow. She is leaving to travel Saturday and need it before than. She would like a call at [phone] when this is complete. Thank you!

## 2023-04-26 NOTE — Telephone Encounter (Signed)
Placed handicap placard in pcp folder at desk.  Will advise PCP of patients urgency.

## 2023-04-26 NOTE — Telephone Encounter (Signed)
Form reviewed it was for [place]?? I have filled out a [place] handicap placard for her to pick up

## 2023-04-26 NOTE — Telephone Encounter (Signed)
Contacted pt regarding handicap placard.   Pt states that [place] ppw is due to having a [employer]. Advised to pt that Susy Frizzle is not able to sign off on a form from out of [place]. Pt verbalized understanding and stated that was okay.   Informed pt that [place] handicap form will be in the front office for her to pick up. Pt verbalized understanding and has no questions or concerns.

## 2023-04-29 ENCOUNTER — Other Ambulatory Visit: Payer: Self-pay | Admitting: Nurse Practitioner

## 2023-04-29 DIAGNOSIS — E039 Hypothyroidism, unspecified: Secondary | ICD-10-CM

## 2023-05-08 ENCOUNTER — Telehealth: Payer: Self-pay | Admitting: Nurse Practitioner

## 2023-05-08 DIAGNOSIS — E039 Hypothyroidism, unspecified: Secondary | ICD-10-CM

## 2023-05-08 NOTE — Telephone Encounter (Signed)
 See message below, pt asking for alternate medication  Copied from CRM [id number]. Topic: Clinical - Medication Question >> May 08, 2023  4:18 PM Christine [name] wrote: Reason for CRM: patients wants to know if she can get a substitute for her rxlevothyroxine (SYNTHROID) 75 MCG it was out of stock and patient needs it

## 2023-05-09 NOTE — Telephone Encounter (Signed)
 Can we call the pharmacy and see what the stock issue is and does some of the surrounding pharmacy have the medication

## 2023-05-10 MED ORDER — LEVOTHYROXINE SODIUM 75 MCG PO TABS: 75.0000 ug | ORAL_TABLET | Freq: Every day | ORAL | 0 refills | Status: DC

## 2023-05-10 NOTE — Addendum Note (Signed)
 Addended by: Eden Emms on: 05/10/2023 04:42 PM   Modules accepted: Orders

## 2023-05-10 NOTE — Telephone Encounter (Signed)
 90 day script sent to the [facility] pharmacy

## 2023-05-10 NOTE — Telephone Encounter (Signed)
 [facility] out of stock due to being on back order.   The [facility] on [address] [place], [place] [address] has levothyroxine in stock.

## 2023-05-14 NOTE — Telephone Encounter (Signed)
 Contacted pt to see if she was able to pick up prescriptions.  Pt stated that he daughter was able to pick up medication from [facility].   Pt  also wanted to reschedule her follow up with PCP.  States that she had to cancel to prioritize her cardiology appointment. Pt is now scheduled for 05/18/23 @220 

## 2023-05-18 ENCOUNTER — Ambulatory Visit: Admitting: Nurse Practitioner

## 2023-05-18 VITALS — BP 140/80 | HR 62 | Temp 98.0°F | Ht 65.0 in | Wt 166.0 lb

## 2023-05-18 DIAGNOSIS — K219 Gastro-esophageal reflux disease without esophagitis: Secondary | ICD-10-CM | POA: Diagnosis not present

## 2023-05-18 DIAGNOSIS — R0789 Other chest pain: Secondary | ICD-10-CM

## 2023-05-18 DIAGNOSIS — R0602 Shortness of breath: Secondary | ICD-10-CM

## 2023-05-18 MED ORDER — RABEPRAZOLE SODIUM 20 MG PO TBEC: 20.0000 mg | DELAYED_RELEASE_TABLET | Freq: Every day | ORAL | 1 refills | Status: DC

## 2023-05-18 NOTE — Progress Notes (Signed)
 Established Patient Office Visit  Subjective   Patient ID: Christine [name], female    DOB: 1934-02-09  Age: 89 y.o. MRN: [id number]  Chief Complaint  Patient presents with   Follow-up    Pt complains of having runny nose and last two weeks she has has some SOB.     HPI  Asthma: History of the same.  Patient was having some DOE status post CABG surgery with pleural effusions.  Patient was able to go up and down her steps several times without having any increased shortness of breath.  GERD: Patient was previously on Aciphex in the past status post CABG she had uncontrolled heartburn and was switched to Protonix 40 mg.  Last open visit patient had finished Protonix 40 and was instructed to go back on Aciphex to see if this controlled her GERD symptoms and is here for follow-up. State that she is out of her aciphex and has been having heartburn since.   Shortness of breath: states that it has been for the past 2 weeks. States that she is dealing with a lot of stress with her daugther at home. State sthat it will hit her. States that it can be with talking. States that she does have an appointment with cardiology on 05/22/2023. She is going up and down the stairs at home and can do that most times without shortness of breath. States that she is having pain in between her shouldes. Stataes that it is worse in the afternoon. She will use otc topicals that will help with pain     Review of Systems  Constitutional:  Negative for chills and fever.  Respiratory:  Negative for shortness of breath.   Cardiovascular:  Negative for chest pain.  Gastrointestinal:  Negative for abdominal pain, constipation, diarrhea, nausea and vomiting.  Neurological:  Negative for dizziness and headaches.  Psychiatric/Behavioral:  Negative for hallucinations and suicidal ideas.       Objective:     BP (!) 140/80   Pulse 62   Temp 98 F (36.7 C) (Oral)   Ht 5\' 5"  (1.651 m)   Wt 166 lb (75.3 kg)  Comment: 157 lb at home  SpO2 97%   BMI 27.62 kg/m  BP Readings from Last 3 Encounters:  05/18/23 (!) 140/80  03/13/23 110/70  03/02/23 110/68   Wt Readings from Last 3 Encounters:  05/18/23 166 lb (75.3 kg)  03/13/23 161 lb (73 kg)  03/02/23 160 lb 6.4 oz (72.8 kg)   SpO2 Readings from Last 3 Encounters:  05/18/23 97%  03/13/23 96%  02/20/23 97%      Physical Exam Vitals and nursing note reviewed.  Constitutional:      Appearance: Normal appearance.  Cardiovascular:     Rate and Rhythm: Normal rate and regular rhythm.     Heart sounds: Normal heart sounds.  Pulmonary:     Effort: Pulmonary effort is normal.     Breath sounds: Normal breath sounds.  Neurological:     Mental Status: She is alert.      No results found for any visits on 05/18/23.    The ASCVD Risk score (Arnett DK, et al., 2019) failed to calculate for the following reasons:   The 2019 ASCVD risk score is only valid for ages 91 to 24    Assessment & Plan:   Problem List Items Addressed This Visit       Digestive   Gastroesophageal reflux disease   Patient was on Aciphex in  the past.  Will switch to pantoprazole status post CABG.  Patient was transition back but has ran out of Aciphex refill provided today.  If controlled with Aciphex continue if not consider switching back to pantoprazole      Relevant Medications   RABEprazole (ACIPHEX) 20 MG tablet     Other   Shortness of breath   Ambiguous in nature with a waxing and waning presentation.  Patient is status post CABG does have a follow-up with cardiology on 05/22/2023.  EKG stable per patient.  Do query if this is anxiety or stress dealing with her home environment.  Continue using asthma inhalers as prescribed continue working on gentle exercise of going up and down the stairs at home      Relevant Medications   RABEprazole (ACIPHEX) 20 MG tablet   Other Relevant Orders   EKG 12-Lead (Completed)   Chest tightness - Primary    Ambiguous in nature.  Patient is able to go up and down the stairs at home without increased shortness of breath.  EKG within normal limits compared to patient's previous EKG.  She does have a follow-up with cardiology will check back after that appointment query if this is anxiety/stress       Return in about 3 months (around 08/18/2023).    Audria Nine, NP

## 2023-05-19 DIAGNOSIS — R0789 Other chest pain: Secondary | ICD-10-CM | POA: Insufficient documentation

## 2023-05-19 NOTE — Assessment & Plan Note (Signed)
 Ambiguous in nature with a waxing and waning presentation.  Patient is status post CABG does have a follow-up with cardiology on 05/22/2023.  EKG stable per patient.  Do query if this is anxiety or stress dealing with her home environment.  Continue using asthma inhalers as prescribed continue working on gentle exercise of going up and down the stairs at home

## 2023-05-19 NOTE — Assessment & Plan Note (Signed)
 Ambiguous in nature.  Patient is able to go up and down the stairs at home without increased shortness of breath.  EKG within normal limits compared to patient's previous EKG.  She does have a follow-up with cardiology will check back after that appointment query if this is anxiety/stress

## 2023-05-19 NOTE — Assessment & Plan Note (Signed)
 Patient was on Aciphex in the past.  Will switch to pantoprazole status post CABG.  Patient was transition back but has ran out of Aciphex refill provided today.  If controlled with Aciphex continue if not consider switching back to pantoprazole

## 2023-05-22 ENCOUNTER — Ambulatory Visit: Payer: Medicare Other | Admitting: Nurse Practitioner

## 2023-05-22 ENCOUNTER — Encounter: Payer: Self-pay | Admitting: Physician Assistant

## 2023-05-22 ENCOUNTER — Ambulatory Visit: Payer: Medicare Other | Attending: Physician Assistant | Admitting: Physician Assistant

## 2023-05-22 VITALS — BP 140/62 | HR 57 | Ht 65.0 in | Wt 169.0 lb

## 2023-05-22 DIAGNOSIS — E785 Hyperlipidemia, unspecified: Secondary | ICD-10-CM | POA: Diagnosis not present

## 2023-05-22 DIAGNOSIS — Z79899 Other long term (current) drug therapy: Secondary | ICD-10-CM | POA: Diagnosis not present

## 2023-05-22 DIAGNOSIS — I2510 Atherosclerotic heart disease of native coronary artery without angina pectoris: Secondary | ICD-10-CM

## 2023-05-22 DIAGNOSIS — I9789 Other postprocedural complications and disorders of the circulatory system, not elsewhere classified: Secondary | ICD-10-CM | POA: Insufficient documentation

## 2023-05-22 DIAGNOSIS — I509 Heart failure, unspecified: Secondary | ICD-10-CM | POA: Diagnosis not present

## 2023-05-22 DIAGNOSIS — Z951 Presence of aortocoronary bypass graft: Secondary | ICD-10-CM | POA: Diagnosis not present

## 2023-05-22 DIAGNOSIS — I4891 Unspecified atrial fibrillation: Secondary | ICD-10-CM | POA: Diagnosis not present

## 2023-05-22 DIAGNOSIS — I10 Essential (primary) hypertension: Secondary | ICD-10-CM

## 2023-05-22 NOTE — Progress Notes (Signed)
 Cardiology Office Note    Date:  05/22/2023   ID:  [name], DOB 1933/08/12, MRN [id number]  PCP:  Eden Emms, NP  Cardiologist:  Yvonne Kendall, MD  Electrophysiologist:  None   Chief Complaint: Follow-up  History of Present Illness:   Christine [name] is a 89 y.o. female with history of CAD s/p four-vessel CABG in 12/2022 (LIMA to LAD, sequential SVG to OM1/OM2, and SVG to distal RCA), postoperative A-fib/flutter, HTN, HLD, rheumatic fever, asthma, carotid artery disease, hypothyroidism, and GERD who presents for follow-up of CAD.   She previously established with cardiology in 02/2020 in the setting of orthopnea, lower extremity swelling, dizziness, fatigue, and orthostatic hypotension.  She reported at that time that she had previously undergone diagnostic cath in [place] over 20 years prior and believed it had shown nonobstructive disease.  Given symptoms, upon establishing with [place], echo showed an EF of 60 to 65% with no regional wall motion abnormalities, grade 1 diastolic dysfunction, and mild to moderate aortic valve sclerosis without evidence of stenosis.  Coronary CTA in 03/2020 showed a calcium score of 1844 (94th percentile) along with 50% proximal LAD stenosis and calcified stenosis in the RCA with normal FFR CT in the proximal RCA and a slow taper in the mid to distal segments from 0.8-0.6.  She was medically managed.  She underwent coronary CTA in 11/2022 that showed a calcium score of 20-80 with greater than 70% proximal to mid LAD stenosis, 25 to 49% left main and LCx stenosis, and 50 to 69% proximal RCA stenosis.  CT FFR abnormal in the LAD and RCA.  Subsequent LHC on 12/11/2022 showed significant multivessel CAD as outlined below.  She was admitted to [facility] and underwent four-vessel CABG in 12/13/2022.  Pre-CABG echo showed an EF of 60 to 65%, no regional wall motion abnormalities, grade 1 diastolic dysfunction, normal RV systolic function and  ventricular cavity size, no significant valvular abnormalities, and an estimated right atrial pressure of 3 mmHg.  Postoperative course was complicated by PAF/flutter with spontaneous conversion to sinus rhythm prior to discharge.  She was not started on anticoagulation due to concerns about acute blood loss anemia.  She was seen in the office on 01/10/2023 noting ongoing indigestion that has been present since bypass surgery.  She also noted some improving shortness of breath with chest x-ray obtained at outside office the day prior showing persistent bilateral pleural effusions and consolidations/atelectasis.  She was prescribed furosemide, though had not started it yet.  She reported a diminished appetite.  It was recommended that she pick up her prescription for furosemide with consideration for thoracentesis at the discretion of CVTS upon follow-up.  She followed up with CVTS on 01/16/2023 with recommendation to increase Toprol to 25 mg daily and stop amiodarone after final 2 doses.  She was last seen in the office in 02/2023 and was doing well from a cardiac perspective.  She was without symptoms of angina or cardiac decompensation.  Underlying fatigue was improving.  Nausea resolved with discontinuation of amiodarone.  Her weight was down 9 pounds when compared to revisit in 12/2022.  She was seen at her PCPs office last week noting randomly occurring shortness of breath.  She was under increased stress at home.  Symptoms were nonexertional and did not feel like prior angina.  EKG was nonacute at that time.  She comes into the office today and is doing well from a cardiac perspective, without symptoms of angina or  cardiac decompensation.  Shortness of breath is overall improved.  She feels like this was related to increased stress at home.  No lower extremity swelling, abdominal distention, early satiety, or progressive orthopnea (baseline two-pillow orthopnea unchanged).  Her weight is up 12 pounds today  when compared to her last visit, felt to be in the setting of sedentary timeframe.  She does not feel like she is holding onto extra fluid.  Since she was last seen she did have 1 mechanical fall, without LOC or head trauma.  No dizziness, presyncope, or syncope.    Labs independently reviewed: 01/2023 - potassium 3.5, BUN 10, serum creatinine 0.94 12/2022  - Hgb 13.1, PLT 223, magnesium 1.8, albumin 3.1, AST/ALT normal, A1c 5.7, LP(a) 37.7 11/2022 -TC 152, TG 174, HDL 60, LDL 57 08/2021 - TSH normal   Past Medical History:  Diagnosis Date   Asthma    Carotid arterial disease (HCC)    a. 06/2019 Carotid U/S: RICA 50-69%. Mod LICA plaque.   Chicken pox    Diastolic dysfunction    a. 02/2020 Echo: EF 60-65%, no rwma, GrI DD. Nl RV size/fxn. Triv MR. Mild-mod Ao sclerosis w/o stenois.   Frequent headaches    GERD (gastroesophageal reflux disease)    Heart murmur    a. 02/2020 Echo: Triv MR. Mild-mod Ao sclerosis w/o stenosis.   Hyperlipidemia    Hypertension    Hypothyroidism    Nonobstructive CAD (coronary artery disease)    a.  Remote Cath in [facility] - reportedly nonobs dzs; b. 03/2020 Cor CTA:  Ca2+ = 1844 (94th %'ile). LAD 50p, RCA 25-49 (FFRct nl in prox RCA w/ slow taper in mid-dist segments from 0.8-->0.6).   Rheumatic fever    Urine incontinence     Past Surgical History:  Procedure Laterality Date   ABDOMINAL HYSTERECTOMY     APPENDECTOMY     BREAST BIOPSY     CORONARY ARTERY BYPASS GRAFT N/A 12/13/2022   Procedure: CORONARY ARTERY BYPASS GRAFTING (CABG) TIMES FOUR UTILIZING LEFT INTERNAL MAMMARY ARTERY AND ENDOSCOPIC VEIN HARVEST RIGHT GREATER SAPHENOUS VEIN;  Surgeon: Loreli Slot, MD;  Location: [facility] OR;  Service: Open Heart Surgery;  Laterality: N/A;   LEFT HEART CATH AND CORONARY ANGIOGRAPHY N/A 12/11/2022   Procedure: LEFT HEART CATH AND CORONARY ANGIOGRAPHY;  Surgeon: Yvonne Kendall, MD;  Location: [facility] INVASIVE CV LAB;  Service: Cardiovascular;  Laterality: N/A;    MASTECTOMY Bilateral 04/1979   TEE WITHOUT CARDIOVERSION N/A 12/13/2022   Procedure: TRANSESOPHAGEAL ECHOCARDIOGRAM;  Surgeon: Loreli Slot, MD;  Location: [facility] OR;  Service: Open Heart Surgery;  Laterality: N/A;    Current Medications: Current Meds  Medication Sig   albuterol (VENTOLIN HFA) 108 (90 Base) MCG/ACT inhaler Inhale 2 puffs into the lungs every 6 (six) hours as needed for wheezing or shortness of breath.   aspirin EC 81 MG tablet Take 1 tablet (81 mg total) by mouth daily. Swallow whole.   fluticasone-salmeterol (WIXELA INHUB) 250-50 MCG/ACT AEPB Inhale 1 puff into the lungs in the morning and at bedtime. 2 puffs at bedtime   levothyroxine (SYNTHROID) 75 MCG tablet Take 1 tablet (75 mcg total) by mouth daily.   loratadine (CLARITIN) 10 MG tablet Take 10 mg by mouth daily as needed for allergies.   losartan (COZAAR) 50 MG tablet Take 1 tablet (50 mg total) by mouth daily.   metoprolol succinate (TOPROL-XL) 25 MG 24 hr tablet Take 1 tablet (25 mg total) by mouth daily.   RABEprazole (ACIPHEX)  20 MG tablet Take 1 tablet (20 mg total) by mouth daily.   rosuvastatin (CRESTOR) 20 MG tablet Take 1 tablet (20 mg total) by mouth daily.    Allergies:   Atorvastatin and Pollen extract   Social History   Socioeconomic History   Marital status: Widowed    Spouse name: Not on file   Number of children: 4   Years of education: college   Highest education level: Not on file  Occupational History   Not on file  Tobacco Use   Smoking status: Never   Smokeless tobacco: Never  Vaping Use   Vaping status: Never Used  Substance and Sexual Activity   Alcohol use: Yes    Comment: maybe once a year   Drug use: Never   Sexual activity: Not Currently  Other Topics Concern   Not on file  Social History Narrative   06/03/19   From: [place] originally, moved here to be near daughter   Living: with daughter - Pamelia Hoit (Nori Riis)   Work: retired from Chemical engineer, Engineer, site       Family: 4 children - Ronni and [place] in [place], daughter in [place] and has adopted daughter in [place]       Enjoys: play tennis, golf, walking, board games, gardening      Exercise: not currently - walking around the house   Diet: not as good as it should be, usually tries to be healthy, avoids fried foods, limits red meat      Safety   Seat belts: Yes    Guns: no   Safe in relationships: Yes    Social Drivers of Corporate investment banker Strain: Low Risk  (03/02/2023)   Overall Financial Resource Strain (CARDIA)    Difficulty of Paying Living Expenses: Not hard at all  Food Insecurity: No Food Insecurity (03/02/2023)   Hunger Vital Sign    Worried About Running Out of Food in the Last Year: Never true    Ran Out of Food in the Last Year: Never true  Transportation Needs: No Transportation Needs (03/02/2023)   PRAPARE - Administrator, Civil Service (Medical): No    Lack of Transportation (Non-Medical): No  Physical Activity: Inactive (03/02/2023)   Exercise Vital Sign    Days of Exercise per Week: 0 days    Minutes of Exercise per Session: 0 min  Stress: No Stress Concern Present (03/02/2023)   [employer] of Occupational Health - Occupational Stress Questionnaire    Feeling of Stress : Only a little  Social Connections: Moderately Isolated (03/02/2023)   Social Connection and Isolation Panel [NHANES]    Frequency of Communication with Friends and Family: More than three times a week    Frequency of Social Gatherings with Friends and Family: More than three times a week    Attends Religious Services: More than 4 times per year    Active Member of [employer] or Organizations: No    Attends Banker Meetings: Never    Marital Status: Widowed     Family History:  The patient's family history includes Early death (age of onset: 51) in her mother; Heart attack (age of onset: 51) in her brother; Heart disease in her brother; Heart disease (age of  onset: 64) in her brother; Kidney disease in her mother; Leukemia in her daughter; Other in her father; Valvular heart disease in her daughter.  ROS:   12-point review of systems is negative unless otherwise noted in the  HPI.   EKGs/Labs/Other Studies Reviewed:    Studies reviewed were summarized above. The additional studies were reviewed today:  Intraoperative TEE 12/13/2022: POST-OP IMPRESSIONS  _ Left Ventricle: The left ventricle is unchanged from pre-bypass. There  is  normal systolic function, with an estimad ejection fraction of 60%.  _ Right Ventricle: normal function.  _ Aorta: The aorta appears unchanged from pre-bypass.  _ Left Atrial Appendage: The left atrial appendage appears unchanged from  pre-bypass.  _ Aortic Valve: The aortic valve appears unchanged from pre-bypass.  _ Mitral Valve: There is mild regurgitation.  _ Tricuspid Valve: The tricuspid valve appears unchanged from pre-bypass.  _ Pulmonic Valve: The pulmonic valve appears unchanged from pre-bypass.  _ Comments:    Following coronary artery bypass grafting, the left ventricular systolic  function is normal. Right ventricular function is normal. There was mild  mitral  regurgitation post-bypass. The remainder of the valvular exam was  unchanged.  __________   Pre-CABG vascular ultrasound 12/12/2022: Summary:  Right Carotid: Velocities in the right ICA are consistent with a 40-59% stenosis. The ECA appears >50% stenosed.   Left Carotid: Velocities in the left ICA are consistent with a 1-39%  stenosis.  Vertebrals: Bilateral vertebral arteries demonstrate antegrade flow.  Subclavians: Right subclavian artery was stenotic. Normal flow  hemodynamics were seen in the left subclavian artery.   Right ABI: Resting right ankle-brachial index is within normal range.  Left ABI: Resting left ankle-brachial index is within normal range.  Bilateral Extremity: Doppler waveforms remain within normal limits with   compression bilaterally for the radial arteries. Doppler waveforms remain  within normal limits with compression bilaterally for the ulnar arteries.  __________   2D echo 12/12/2022: 1. Left ventricular ejection fraction, by estimation, is 60 to 65%. The  left ventricle has normal function. The left ventricle has no regional  wall motion abnormalities. Left ventricular diastolic parameters are  consistent with Grade I diastolic  dysfunction (impaired relaxation).   2. Right ventricular systolic function is normal. The right ventricular  size is normal. Tricuspid regurgitation signal is inadequate for assessing  PA pressure.   3. The mitral valve is normal in structure. No evidence of mitral valve  regurgitation. No evidence of mitral stenosis.   4. The aortic valve is tricuspid. Aortic valve regurgitation is not  visualized. No aortic stenosis is present.   5. The inferior vena cava is normal in size with greater than 50%  respiratory variability, suggesting right atrial pressure of 3 mmHg.  __________   [facility] 12/11/2022: Conclusions: Significant multivessel coronary artery disease, including sequential 60% ostial and 50% mid LAD stenoses, complex ostial LCx stenosis involving trifurcation of OM1, OM2, and LCx proper with hazy 90% stenosis, and diffuse calcified RCA disease of up to 70%. Normal left ventricular systolic function (LVEF 55-65%) and filling pressure (LVEDP 10 mmHg).   Recommendations: Given severe multivessel CAD and continued vague chest pain at rest and with activity, we will admit [name] for medical optimization and cardiac surgery consultation for CABG. Continue aggressive secondary prevention of coronary artery disease. Obtain echocardiogram. __________   Coronary CTA 11/30/2022: FINDINGS: Aorta:  Normal size.  Aortic wall calcifications.  No dissection.   Aortic Valve:  Trileaflet.  No calcifications.   Coronary Arteries:  Normal coronary origin.  Right  dominance.   RCA is a dominant artery. There is calcified plaque proximally causing moderate stenosis (50-69%).   Left main gives rise to LAD and LCX arteries. LM has calcified plaque  proximally causing mild stenosis (25-49%).   LAD has heavily calcified plaque proximally causing severe stenosis (>70%).   LCX is a non-dominant artery. There is calcified plaque proximally causing mild stenosis (25-49%).   Other findings:   Normal pulmonary vein drainage into the left atrium.   Normal left atrial appendage without a thrombus.   Normal size of the pulmonary artery.   IMPRESSION: 1. Coronary calcium score of 2280.   2. Normal coronary origin with right dominance.   3. Severe proximal-mid LAD stenosis (>70%).   4. Moderate proximal RCA stenosis (50-69%).   5. Mild LM and LCx stenosis (25-49%).   6. CAD-RADS 4 Severe stenosis. (70-99% or > 50% left main). Cardiac catheterization is recommended. Consider symptom-guided anti-ischemic pharmacotherapy as well as risk factor modification per guideline directed care.   7. Additional analysis with CT FFR will be submitted and reported separately.     ctFFR: 1. Left Main:  No significant stenosis.   2. LAD: significant stenosis in mid LAD.  FFRct 0.73 3. LCX: No significant stenosis (borderline).  FFRct 0.80 4. RCA: significant stenosis in proximal RCA.  FFRct 0.75   IMPRESSION: 1. CT FFR analysis showed significant stenosis in mid LAD and proximal RCA.   2.  Cardiac catheterization recommended. __________   Carotid artery ultrasound 06/06/2022: Summary:  Right Carotid: Velocities in the right ICA are consistent with a 40-59% stenosis. Non-hemodynamically significant plaque <50% noted in the CCA. The ECA appears >50% stenosed.   Left Carotid: Velocities in the left ICA are consistent with a 1-39%  stenosis. Non-hemodynamically significant plaque <50% noted in the  CCA. The ECA appears >50% stenosed.   Vertebrals:   Bilateral vertebral arteries demonstrate antegrade flow.  Subclavians: Bilateral subclavian arteries were stenotic.  __________   Coronary CTA with CT FFR 04/08/2020: Aorta: Normal size. Mild aortic root wall calcifications. No dissection.   Aortic Valve:  Trileaflet.  No calcifications.   Coronary Arteries:  Normal coronary origin.  Right dominance.   RCA is a dominant artery that gives rise to PDA and PLA. There is calcified plaque throughout the RCA causing mild stenosis (25-49%).   Left main is a large artery that gives rise to LAD and LCX arteries. There is mild calcification in the mid to distal LM causing minimal stenosis (<25%).   LAD is heavily calcified in the proximal to mid segments causing moderate (50%) stenosis in the proximal segment and mild stenosis (25-49%) in the mid segment.   LCX is a non-dominant artery that gives rise to one obtuse marginal branch. There is no plaque.   Other findings:   Normal pulmonary vein drainage into the left atrium.   Normal left atrial appendage without a thrombus.   Normal size of the pulmonary artery.   IMPRESSION: 1. High coronary calcium score of 1844. This was 94th percentile for age and sex matched control. 2. Normal coronary origin with right dominance. 3. Calcified plaque causing moderate stenosis (50%) in the proximal LAD. 4. Calcified plaque causing mild (25-49%) stenosis throughout the RCA and mid LAD. 5. CAD-RADS 3. Moderate stenosis. Consider symptom-guided anti-ischemic pharmacotherapy as well as risk factor modification per guideline directed care. 6. Additional analysis with CT FFR will be submitted and reported separately.   CT FFR: 1. Left Main:  No significant stenosis.   2. LAD: No significant stenosis. 3. LCX: No significant stenosis. 4. RCA: No significant focal stenosis. FFRct normal in the proximal RCA segment with slow taper in the mid to distal  segments from 0.8 to 0.6 distally.    IMPRESSION: 1. CT FFR analysis didn't show any significant focal stenosis in the LAD or LCx. 2.  Slow taper in FFRct values in the RCA with no focal stenosis. 3.  Recommend medical management. __________   2D echo 03/09/2020: 1. Left ventricular ejection fraction, by estimation, is 60 to 65%. The  left ventricle has normal function. The left ventricle has no regional  wall motion abnormalities. Left ventricular diastolic parameters are  consistent with Grade I diastolic  dysfunction (impaired relaxation).   2. Right ventricular systolic function is normal. The right ventricular  size is normal. Tricuspid regurgitation signal is inadequate for assessing  PA pressure.   3. The mitral valve is degenerative. Trivial mitral valve regurgitation.  No evidence of mitral stenosis.   4. The aortic valve has an indeterminant number of cusps. There is mild  calcification of the aortic valve. There is moderate thickening of the  aortic valve. Aortic valve regurgitation is trivial. Mild to moderate  aortic valve sclerosis/calcification is  present, without any evidence of aortic stenosis.   5. The inferior vena cava is dilated in size with >50% respiratory  variability, suggesting right atrial pressure of 8 mmHg.   EKG:  EKG is ordered today.  The EKG ordered today demonstrates sinus bradycardia, 57 bpm, no acute st/t changes  Recent Labs: 12/13/2022: ALT 17 12/16/2022: Magnesium 1.8 01/09/2023: Hemoglobin 13.1; Platelets 223.0 01/17/2023: BUN 10; Creatinine, Ser 0.94; Potassium 3.5; Sodium 139  Recent Lipid Panel    Component Value Date/Time   CHOL 152 11/14/2022 1108   CHOL 182 09/08/2020 1045   TRIG 174 (H) 11/14/2022 1108   HDL 60 11/14/2022 1108   HDL 57 09/08/2020 1045   CHOLHDL 2.5 11/14/2022 1108   VLDL 35 11/14/2022 1108   LDLCALC 57 11/14/2022 1108   LDLCALC 93 09/08/2020 1045   LDLDIRECT 116.0 02/12/2020 1233    PHYSICAL EXAM:    VS:  BP (!) 140/62 (BP Location: Left  Arm, Patient Position: Sitting, Cuff Size: Normal)   Pulse (!) 57   Ht 5\' 5"  (1.651 m)   Wt 169 lb (76.7 kg)   SpO2 98%   BMI 28.12 kg/m   BMI: Body mass index is 28.12 kg/m.  Physical Exam Vitals reviewed.  Constitutional:      Appearance: She is well-developed.  HENT:     Head: Normocephalic and atraumatic.  Eyes:     General:        Right eye: No discharge.        Left eye: No discharge.  Neck:     Vascular: No JVD.  Cardiovascular:     Rate and Rhythm: Regular rhythm. Bradycardia present.     Heart sounds: Normal heart sounds, S1 normal and S2 normal. Heart sounds not distant. No midsystolic click and no opening snap. No murmur heard.    No friction rub.  Pulmonary:     Effort: Pulmonary effort is normal. No respiratory distress.     Breath sounds: Normal breath sounds. No decreased breath sounds, wheezing, rhonchi or rales.  Chest:     Chest wall: No tenderness.  Abdominal:     General: There is no distension.     Palpations: Abdomen is soft.     Tenderness: There is no abdominal tenderness.  Musculoskeletal:     Cervical back: Normal range of motion.     Right lower leg: No edema.     Left lower leg: No edema.  Skin:    General: Skin is warm and dry.     Nails: There is no clubbing.  Neurological:     Mental Status: She is alert and oriented to person, place, and time.  Psychiatric:        Speech: Speech normal.        Behavior: Behavior normal.        Thought Content: Thought content normal.        Judgment: Judgment normal.     Wt Readings from Last 3 Encounters:  05/22/23 169 lb (76.7 kg)  05/18/23 166 lb (75.3 kg)  03/13/23 161 lb (73 kg)     ASSESSMENT & PLAN:   Multivessel CAD status post CABG: She is doing well and without symptoms of angina or cardiac decompensation.  Recent episodes of shortness of breath have improved.  Will obtain CBC, BMP, and BNP.  Otherwise, given improvement in symptoms we will defer further testing at this time unless  symptoms return.  She believes these symptoms were related to increased stress/anxiety.  Continue aggressive risk factor modification and secondary prevention including aspirin 81 mg, Toprol-XL 25 mg, and rosuvastatin 20 mg.  Postoperative A-fib: Maintaining sinus rhythm.  Has not been maintained on anticoagulation given concern for anemia.  Remains on Toprol-XL.  HTN: Blood pressure is reasonably controlled in the office today.  She remains on losartan 50 mg and Toprol-XL 25 mg.  HLD: LDL 57.  She remains on rosuvastatin 20 mg.    Disposition: F/u with Dr. Okey Dupre or an APP in 6 months.   Medication Adjustments/Labs and Tests Ordered: Current medicines are reviewed at length with the patient today.  Concerns regarding medicines are outlined above. Medication changes, Labs and Tests ordered today are summarized above and listed in the Patient Instructions accessible in Encounters.   Signed, Eula Listen, PA-C 05/22/2023 1:13 PM     [facility] [place] [address] [place], [place] [address] [phone]

## 2023-05-22 NOTE — Patient Instructions (Signed)
 Medication Instructions:  Your Physician recommend you continue on your current medication as directed.    *If you need a refill on your cardiac medications before your next appointment, please call your pharmacy*   Lab Work: Your provider would like for you to have following labs drawn today CBC, BMeT, and BNP.   If you have labs (blood work) drawn today and your tests are completely normal, you will receive your results only by: MyChart Message (if you have MyChart) OR A paper copy in the mail If you have any lab test that is abnormal or we need to change your treatment, we will call you to review the results.   Follow-Up: At [facility], you and your health needs are our priority.  As part of our continuing mission to provide you with exceptional heart care, we have created designated Provider Care Teams.  These Care Teams include your primary Cardiologist (physician) and Advanced Practice Providers (APPs -  Physician Assistants and Nurse Practitioners) who all work together to provide you with the care you need, when you need it.     Your next appointment:   6 month(s)  Provider:   You may see Yvonne Kendall, MD or Eula Listen, PA-C

## 2023-05-24 LAB — BASIC METABOLIC PANEL
BUN/Creatinine Ratio: 20 (ref 12–28)
BUN: 19 mg/dL (ref 8–27)
CO2: 23 mmol/L (ref 20–29)
Calcium: 9.1 mg/dL (ref 8.7–10.3)
Chloride: 105 mmol/L (ref 96–106)
Creatinine, Ser: 0.94 mg/dL (ref 0.57–1.00)
Glucose: 106 mg/dL — ABNORMAL HIGH (ref 70–99)
Potassium: 4.9 mmol/L (ref 3.5–5.2)
Sodium: 141 mmol/L (ref 134–144)
eGFR: 59 mL/min/{1.73_m2} — ABNORMAL LOW (ref >59)

## 2023-05-24 LAB — CBC
Hematocrit: 46.3 % (ref 34.0–46.6)
Hemoglobin: 15.3 g/dL (ref 11.1–15.9)
MCH: 28.4 pg (ref 26.6–33.0)
MCHC: 33 g/dL (ref 31.5–35.7)
MCV: 86 fL (ref 79–97)
Platelets: 185 10*3/uL (ref 150–450)
RBC: 5.39 x10E6/uL — ABNORMAL HIGH (ref 3.77–5.28)
RDW: 16.4 % — ABNORMAL HIGH (ref 11.7–15.4)
WBC: 7.4 10*3/uL (ref 3.4–10.8)

## 2023-05-24 LAB — BRAIN NATRIURETIC PEPTIDE: BNP: 210.8 pg/mL — ABNORMAL HIGH (ref 0.0–100.0)

## 2023-05-25 ENCOUNTER — Other Ambulatory Visit: Payer: Self-pay | Admitting: Emergency Medicine

## 2023-05-25 MED ORDER — FUROSEMIDE 20 MG PO TABS: 20.0000 mg | ORAL_TABLET | Freq: Every day | ORAL | 3 refills | Status: DC | PRN

## 2023-05-28 ENCOUNTER — Telehealth: Payer: Self-pay | Admitting: Nurse Practitioner

## 2023-05-28 NOTE — Telephone Encounter (Signed)
 Can we call and see how her shortness of breath is doing?

## 2023-05-28 NOTE — Telephone Encounter (Signed)
-----   Message from [facility] sent at 05/19/2023  7:34 AM EST ----- Regarding: SHOB/anxiety/stress See how cardiology went and see how [facility] is doing

## 2023-05-29 NOTE — Telephone Encounter (Signed)
 Called patient has had some improvement with SOB. She was very sick over the weekend with nausea and vomiting over the weekend. She states that cardiology added medication at last visit to help with fluid build up and that has helped some. She wanted to thank you and let you know she is doing ok.

## 2023-07-31 ENCOUNTER — Other Ambulatory Visit: Payer: Self-pay | Admitting: *Deleted

## 2023-07-31 DIAGNOSIS — I10 Essential (primary) hypertension: Secondary | ICD-10-CM

## 2023-07-31 MED ORDER — LOSARTAN POTASSIUM 50 MG PO TABS: 50.0000 mg | ORAL_TABLET | Freq: Every day | ORAL | 3 refills | Status: DC

## 2023-07-31 MED ORDER — METOPROLOL SUCCINATE ER 25 MG PO TB24: 25.0000 mg | ORAL_TABLET | Freq: Every day | ORAL | 3 refills | Status: DC

## 2023-07-31 NOTE — Progress Notes (Signed)
Refill sent in for patient.  °

## 2023-08-19 NOTE — Progress Notes (Unsigned)
 Cardiology Office Note    Date:  08/20/2023   ID:  Christine Virginia  [name], Christine [name] 07-09-1933, MRN [id number]  PCP:  Dorothe Gaster, NP  Cardiologist:  Sammy Crisp, MD  Electrophysiologist:  None   Chief Complaint: Chest pain  History of Present Illness:   Christine Virginia  [name] is a 89 y.o. female with history of CAD s/p four-vessel CABG in 12/2022 (LIMA to LAD, sequential SVG to OM1/OM2, and SVG to distal RCA), postoperative A-fib/flutter, HTN, HLD, rheumatic fever, asthma, carotid artery disease, hypothyroidism, and GERD who presents for evaluation of chest pain.   She previously established with cardiology in 02/2020 in the setting of orthopnea, lower extremity swelling, dizziness, fatigue, and orthostatic hypotension.  She reported at that time that she had previously undergone diagnostic cath in [place]  over 20 years prior and believed it had shown nonobstructive disease.  Given symptoms upon establishing with us , echo showed an EF of 60 to 65% with no regional wall motion abnormalities, grade 1 diastolic dysfunction, and mild to moderate aortic valve sclerosis without evidence of stenosis.  Coronary CTA in 03/2020 showed a calcium  score of 1844 (94th percentile) along with 50% proximal LAD stenosis and calcified stenosis in the RCA with normal FFR CT in the proximal RCA and a slow taper in the mid to distal segments from 0.8-0.6.  She was medically managed.  She underwent coronary CTA in 11/2022 that showed a calcium  score of 20-80 with greater than 70% proximal to mid LAD stenosis, 25 to 49% left main and LCx stenosis, and 50 to 69% proximal RCA stenosis.  CT FFR abnormal in the LAD and RCA.  Subsequent LHC on 12/11/2022 showed significant multivessel CAD as outlined below.  She was admitted to [facility] and underwent four-vessel CABG in 12/13/2022.  Pre-CABG echo showed an EF of 60 to 65%, no regional wall motion abnormalities, grade 1 diastolic dysfunction, normal RV systolic function  and ventricular cavity size, no significant valvular abnormalities, and an estimated right atrial pressure of 3 mmHg.  Postoperative course was complicated by PAF/flutter with spontaneous conversion to sinus rhythm prior to discharge.  She was not started on anticoagulation due to concerns about acute blood loss anemia.  She was seen in the office on 01/10/2023 noting ongoing indigestion that has been present since bypass surgery.  She also noted some improving shortness of breath with chest x-ray obtained at outside office the day prior showing persistent bilateral pleural effusions and consolidations/atelectasis.  She was prescribed furosemide , though had not started it yet.  She reported a diminished appetite.  It was recommended that she pick up her prescription for furosemide  with consideration for thoracentesis at the discretion of CVTS upon follow-up.  She followed up with CVTS on 01/16/2023 with recommendation to increase Toprol  to 25 mg daily and stop amiodarone  after final 2 doses.  She was seen in the office in 02/2023 and was doing well from a cardiac perspective.  She was without symptoms of angina or cardiac decompensation.  Underlying fatigue was improving.  Nausea resolved with discontinuation of amiodarone .  Her weight was down 9 pounds when compared to revisit in 12/2022.  She was last seen in the office on 05/22/2023 and was doing well from a cardiac perspective with improvement in shortness of breath that she felt like was related to increased stress.  No changes in pharmacotherapy were pursued at that time.  She reports an episode of chest discomfort that occurred 2 weeks ago with associated shortness of breath  and near syncope that lasted for several minutes followed by spontaneous resolution discomfort did not feel similar to what she experienced leading up to her CABG.  She again had an episode of chest discomfort approximately 5 days ago that lasted for about 10 minutes and resolved after  taking a shower.  After going to church on 6/8, eating lunch, and while walking in a grocery store she developed sudden onset of near syncope without chest pain or dyspnea.  She went out to the car and sat down with improvement in symptoms.  She has been without symptoms since and indicates she feels well at this time.  She does not describe a sensation of dizziness and has been without frank syncope.  No significant lower extremity swelling, though did prop herself up more last evening to sleep.  She has been out of furosemide  for approximately 3 days and rosuvastatin  for about 1 week.   Labs independently reviewed: 05/2023 - Hgb 15.3, PLT 185, BNP 210, BUN 19, serum creatinine 0.94, potassium 4.9 12/2022 - magnesium  1.8, albumin  3.1, AST/ALT normal, A1c 5.7, LP(a) 37.7 11/2022 -TC 152, TG 174, HDL 60, LDL 57 08/2021 - TSH normal   Past Medical History:  Diagnosis Date   Asthma    Carotid arterial disease (HCC)    a. 06/2019 Carotid U/S: RICA 50-69%. Mod LICA plaque.   Chicken pox    Diastolic dysfunction    a. 02/2020 Echo: EF 60-65%, no rwma, GrI DD. Nl RV size/fxn. Triv MR. Mild-mod Ao sclerosis w/o stenois.   Frequent headaches    GERD (gastroesophageal reflux disease)    Heart murmur    a. 02/2020 Echo: Triv MR. Mild-mod Ao sclerosis w/o stenosis.   Hyperlipidemia    Hypertension    Hypothyroidism    Nonobstructive CAD (coronary artery disease)    a.  Remote Cath in [facility] - reportedly nonobs dzs; b. 03/2020 Cor CTA:  Ca2+ = 1844 (94th %'ile). LAD 50p, RCA 25-49 (FFRct nl in prox RCA w/ slow taper in mid-dist segments from 0.8-->0.6).   Rheumatic fever    Urine incontinence     Past Surgical History:  Procedure Laterality Date   ABDOMINAL HYSTERECTOMY     APPENDECTOMY     BREAST BIOPSY     CORONARY ARTERY BYPASS GRAFT N/A 12/13/2022   Procedure: CORONARY ARTERY BYPASS GRAFTING (CABG) TIMES FOUR UTILIZING LEFT INTERNAL MAMMARY ARTERY AND ENDOSCOPIC VEIN HARVEST RIGHT GREATER SAPHENOUS  VEIN;  Surgeon: Zelphia Higashi, MD;  Location: [facility] OR;  Service: Open Heart Surgery;  Laterality: N/A;   LEFT HEART CATH AND CORONARY ANGIOGRAPHY N/A 12/11/2022   Procedure: LEFT HEART CATH AND CORONARY ANGIOGRAPHY;  Surgeon: Sammy Crisp, MD;  Location: [facility] INVASIVE CV LAB;  Service: Cardiovascular;  Laterality: N/A;   MASTECTOMY Bilateral 04/1979   TEE WITHOUT CARDIOVERSION N/A 12/13/2022   Procedure: TRANSESOPHAGEAL ECHOCARDIOGRAM;  Surgeon: Zelphia Higashi, MD;  Location: [facility] OR;  Service: Open Heart Surgery;  Laterality: N/A;    Current Medications: Current Meds  Medication Sig   albuterol  (VENTOLIN  HFA) 108 (90 Base) MCG/ACT inhaler Inhale 2 puffs into the lungs every 6 (six) hours as needed for wheezing or shortness of breath.   aspirin  EC 81 MG tablet Take 1 tablet (81 mg total) by mouth daily. Swallow whole.   fluticasone -salmeterol (WIXELA INHUB) 250-50 MCG/ACT AEPB Inhale 1 puff into the lungs in the morning and at bedtime. 2 puffs at bedtime   levothyroxine  (SYNTHROID ) 75 MCG tablet Take 1 tablet (75  mcg total) by mouth daily.   loratadine  (CLARITIN ) 10 MG tablet Take 10 mg by mouth daily as needed for allergies.   losartan  (COZAAR ) 50 MG tablet Take 1 tablet (50 mg total) by mouth daily.   metoprolol  succinate (TOPROL -XL) 25 MG 24 hr tablet Take 1 tablet (25 mg total) by mouth daily.   RABEprazole  (ACIPHEX ) 20 MG tablet Take 1 tablet (20 mg total) by mouth daily.   [DISCONTINUED] furosemide  (LASIX ) 20 MG tablet Take 1 tablet (20 mg total) by mouth daily as needed (for shortness of breath).   [DISCONTINUED] rosuvastatin  (CRESTOR ) 20 MG tablet Take 1 tablet (20 mg total) by mouth daily.    Allergies:   Atorvastatin  and Pollen extract   Social History   Socioeconomic History   Marital status: Widowed    Spouse name: Not on file   Number of children: 4   Years of education: college   Highest education level: Not on file  Occupational History   Not on file   Tobacco Use   Smoking status: Never   Smokeless tobacco: Never  Vaping Use   Vaping status: Never Used  Substance and Sexual Activity   Alcohol use: Yes    Comment: maybe once a year   Drug use: Never   Sexual activity: Not Currently  Other Topics Concern   Not on file  Social History Narrative   06/03/19   From: [place]  originally, moved here to be near daughter   Living: with daughter - [employer] sales representative)   Work: retired from Chemical engineer, Engineer, site      Family: 4 children - [place] and [place] in [place], daughter in [place] and has adopted daughter in [place]        Enjoys: play tennis, golf, walking, board games, gardening      Exercise: not currently - walking around the house   Diet: not as good as it should be, usually tries to be healthy, avoids fried foods, limits red meat      Safety   Seat belts: Yes    Guns: no   Safe in relationships: Yes    Social Drivers of Corporate investment banker Strain: Low Risk  (03/02/2023)   Overall Financial Resource Strain (CARDIA)    Difficulty of Paying Living Expenses: Not hard at all  Food Insecurity: No Food Insecurity (03/02/2023)   Hunger Vital Sign    Worried About Running Out of Food in the Last Year: Never true    Ran Out of Food in the Last Year: Never true  Transportation Needs: No Transportation Needs (03/02/2023)   PRAPARE - Administrator, Civil Service (Medical): No    Lack of Transportation (Non-Medical): No  Physical Activity: Inactive (03/02/2023)   Exercise Vital Sign    Days of Exercise per Week: 0 days    Minutes of Exercise per Session: 0 min  Stress: No Stress Concern Present (03/02/2023)   [employer] of Occupational Health - Occupational Stress Questionnaire    Feeling of Stress : Only a little  Social Connections: Moderately Isolated (03/02/2023)   Social Connection and Isolation Panel [NHANES]    Frequency of Communication with Friends and Family: More than three times a  week    Frequency of Social Gatherings with Friends and Family: More than three times a week    Attends Religious Services: More than 4 times per year    Active Member of [employer] or Organizations: No    Attends Banker Meetings:  Never    Marital Status: Widowed     Family History:  The patient's family history includes Early death (age of onset: 21) in her mother; Heart attack (age of onset: 49) in her brother; Heart disease in her brother; Heart disease (age of onset: 44) in her brother; Kidney disease in her mother; Leukemia in her daughter; Other in her father; Valvular heart disease in her daughter.  ROS:   12-point review of systems is negative unless otherwise noted in the HPI.   EKGs/Labs/Other Studies Reviewed:    Studies reviewed were summarized above. The additional studies were reviewed today:  Intraoperative TEE 12/13/2022: POST-OP IMPRESSIONS  _ Left Ventricle: The left ventricle is unchanged from pre-bypass. There  is  normal systolic function, with an estimad ejection fraction of 60%.  _ Right Ventricle: normal function.  _ Aorta: The aorta appears unchanged from pre-bypass.  _ Left Atrial Appendage: The left atrial appendage appears unchanged from  pre-bypass.  _ Aortic Valve: The aortic valve appears unchanged from pre-bypass.  _ Mitral Valve: There is mild regurgitation.  _ Tricuspid Valve: The tricuspid valve appears unchanged from pre-bypass.  _ Pulmonic Valve: The pulmonic valve appears unchanged from pre-bypass.  _ Comments:    Following coronary artery bypass grafting, the left ventricular systolic  function is normal. Right ventricular function is normal. There was mild  mitral  regurgitation post-bypass. The remainder of the valvular exam was  unchanged.  __________   Pre-CABG vascular ultrasound 12/12/2022: Summary:  Right Carotid: Velocities in the right ICA are consistent with a 40-59% stenosis. The ECA appears >50% stenosed.   Left  Carotid: Velocities in the left ICA are consistent with a 1-39%  stenosis.  Vertebrals: Bilateral vertebral arteries demonstrate antegrade flow.  Subclavians: Right subclavian artery was stenotic. Normal flow  hemodynamics were seen in the left subclavian artery.   Right ABI: Resting right ankle-brachial index is within normal range.  Left ABI: Resting left ankle-brachial index is within normal range.  Bilateral Extremity: Doppler waveforms remain within normal limits with  compression bilaterally for the radial arteries. Doppler waveforms remain  within normal limits with compression bilaterally for the ulnar arteries.  __________   2D echo 12/12/2022: 1. Left ventricular ejection fraction, by estimation, is 60 to 65%. The  left ventricle has normal function. The left ventricle has no regional  wall motion abnormalities. Left ventricular diastolic parameters are  consistent with Grade I diastolic  dysfunction (impaired relaxation).   2. Right ventricular systolic function is normal. The right ventricular  size is normal. Tricuspid regurgitation signal is inadequate for assessing  PA pressure.   3. The mitral valve is normal in structure. No evidence of mitral valve  regurgitation. No evidence of mitral stenosis.   4. The aortic valve is tricuspid. Aortic valve regurgitation is not  visualized. No aortic stenosis is present.   5. The inferior vena cava is normal in size with greater than 50%  respiratory variability, suggesting right atrial pressure of 3 mmHg.  __________   [facility] 12/11/2022: Conclusions: Significant multivessel coronary artery disease, including sequential 60% ostial and 50% mid LAD stenoses, complex ostial LCx stenosis involving trifurcation of OM1, OM2, and LCx proper with hazy 90% stenosis, and diffuse calcified RCA disease of up to 70%. Normal left ventricular systolic function (LVEF 55-65%) and filling pressure (LVEDP 10 mmHg).   Recommendations: Given severe  multivessel CAD and continued vague chest pain at rest and with activity, we will admit Ms. Dion Frankel for medical optimization  and cardiac surgery consultation for CABG. Continue aggressive secondary prevention of coronary artery disease. Obtain echocardiogram. __________   Coronary CTA 11/30/2022: FINDINGS: Aorta:  Normal size.  Aortic wall calcifications.  No dissection.   Aortic Valve:  Trileaflet.  No calcifications.   Coronary Arteries:  Normal coronary origin.  Right dominance.   RCA is a dominant artery. There is calcified plaque proximally causing moderate stenosis (50-69%).   Left main gives rise to LAD and LCX arteries. LM has calcified plaque proximally causing mild stenosis (25-49%).   LAD has heavily calcified plaque proximally causing severe stenosis (>70%).   LCX is a non-dominant artery. There is calcified plaque proximally causing mild stenosis (25-49%).   Other findings:   Normal pulmonary vein drainage into the left atrium.   Normal left atrial appendage without a thrombus.   Normal size of the pulmonary artery.   IMPRESSION: 1. Coronary calcium  score of 2280.   2. Normal coronary origin with right dominance.   3. Severe proximal-mid LAD stenosis (>70%).   4. Moderate proximal RCA stenosis (50-69%).   5. Mild LM and LCx stenosis (25-49%).   6. CAD-RADS 4 Severe stenosis. (70-99% or > 50% left main). Cardiac catheterization is recommended. Consider symptom-guided anti-ischemic pharmacotherapy as well as risk factor modification per guideline directed care.   7. Additional analysis with CT FFR will be submitted and reported separately.     ctFFR: 1. Left Main:  No significant stenosis.   2. LAD: significant stenosis in mid LAD.  FFRct 0.73 3. LCX: No significant stenosis (borderline).  FFRct 0.80 4. RCA: significant stenosis in proximal RCA.  FFRct 0.75   IMPRESSION: 1. CT FFR analysis showed significant stenosis in mid LAD and proximal  RCA.   2.  Cardiac catheterization recommended. __________   Carotid artery ultrasound 06/06/2022: Summary:  Right Carotid: Velocities in the right ICA are consistent with a 40-59% stenosis. Non-hemodynamically significant plaque <50% noted in the CCA. The ECA appears >50% stenosed.   Left Carotid: Velocities in the left ICA are consistent with a 1-39%  stenosis. Non-hemodynamically significant plaque <50% noted in the  CCA. The ECA appears >50% stenosed.   Vertebrals:  Bilateral vertebral arteries demonstrate antegrade flow.  Subclavians: Bilateral subclavian arteries were stenotic.  __________   Coronary CTA with CT FFR 04/08/2020: Aorta: Normal size. Mild aortic root wall calcifications. No dissection.   Aortic Valve:  Trileaflet.  No calcifications.   Coronary Arteries:  Normal coronary origin.  Right dominance.   RCA is a dominant artery that gives rise to PDA and PLA. There is calcified plaque throughout the RCA causing mild stenosis (25-49%).   Left main is a large artery that gives rise to LAD and LCX arteries. There is mild calcification in the mid to distal LM causing minimal stenosis (<25%).   LAD is heavily calcified in the proximal to mid segments causing moderate (50%) stenosis in the proximal segment and mild stenosis (25-49%) in the mid segment.   LCX is a non-dominant artery that gives rise to one obtuse marginal branch. There is no plaque.   Other findings:   Normal pulmonary vein drainage into the left atrium.   Normal left atrial appendage without a thrombus.   Normal size of the pulmonary artery.   IMPRESSION: 1. High coronary calcium  score of 1844. This was 94th percentile for age and sex matched control. 2. Normal coronary origin with right dominance. 3. Calcified plaque causing moderate stenosis (50%) in the proximal LAD. 4. Calcified plaque  causing mild (25-49%) stenosis throughout the RCA and mid LAD. 5. CAD-RADS 3. Moderate stenosis.  Consider symptom-guided anti-ischemic pharmacotherapy as well as risk factor modification per guideline directed care. 6. Additional analysis with CT FFR will be submitted and reported separately.   CT FFR: 1. Left Main:  No significant stenosis.   2. LAD: No significant stenosis. 3. LCX: No significant stenosis. 4. RCA: No significant focal stenosis. FFRct normal in the proximal RCA segment with slow taper in the mid to distal segments from 0.8 to 0.6 distally.   IMPRESSION: 1. CT FFR analysis didn't show any significant focal stenosis in the LAD or LCx. 2.  Slow taper in FFRct values in the RCA with no focal stenosis. 3.  Recommend medical management. __________   2D echo 03/09/2020: 1. Left ventricular ejection fraction, by estimation, is 60 to 65%. The  left ventricle has normal function. The left ventricle has no regional  wall motion abnormalities. Left ventricular diastolic parameters are  consistent with Grade I diastolic  dysfunction (impaired relaxation).   2. Right ventricular systolic function is normal. The right ventricular  size is normal. Tricuspid regurgitation signal is inadequate for assessing  PA pressure.   3. The mitral valve is degenerative. Trivial mitral valve regurgitation.  No evidence of mitral stenosis.   4. The aortic valve has an indeterminant number of cusps. There is mild  calcification of the aortic valve. There is moderate thickening of the  aortic valve. Aortic valve regurgitation is trivial. Mild to moderate  aortic valve sclerosis/calcification is  present, without any evidence of aortic stenosis.   5. The inferior vena cava is dilated in size with >50% respiratory  variability, suggesting right atrial pressure of 8 mmHg.   EKG:  EKG is ordered today.  The EKG ordered today demonstrates sinus bradycardia, 59 bpm, no acute ST-T changes  Recent Labs: 12/13/2022: ALT 17 12/16/2022: Magnesium  1.8 05/22/2023: BNP 210.8; BUN 19; Creatinine,  Ser 0.94; Hemoglobin 15.3; Platelets 185; Potassium 4.9; Sodium 141  Recent Lipid Panel    Component Value Date/Time   CHOL 152 11/14/2022 1108   CHOL 182 09/08/2020 1045   TRIG 174 (H) 11/14/2022 1108   HDL 60 11/14/2022 1108   HDL 57 09/08/2020 1045   CHOLHDL 2.5 11/14/2022 1108   VLDL 35 11/14/2022 1108   LDLCALC 57 11/14/2022 1108   LDLCALC 93 09/08/2020 1045   LDLDIRECT 116.0 02/12/2020 1233    PHYSICAL EXAM:    VS:  BP 136/68   Pulse (!) 59   Ht 5\' 5"  (1.651 m)   Wt 173 lb 6.4 oz (78.7 kg)   SpO2 95%   BMI 28.86 kg/m   BMI: Body mass index is 28.86 kg/m.  Physical Exam Vitals reviewed.  Constitutional:      Appearance: She is well-developed.  HENT:     Head: Normocephalic and atraumatic.  Eyes:     General:        Right eye: No discharge.        Left eye: No discharge.  Cardiovascular:     Rate and Rhythm: Normal rate and regular rhythm.     Pulses:          Posterior tibial pulses are 2+ on the right side and 2+ on the left side.     Heart sounds: Normal heart sounds, S1 normal and S2 normal. Heart sounds not distant. No midsystolic click and no opening snap. No murmur heard.    No friction rub.  Pulmonary:  Effort: Pulmonary effort is normal. No respiratory distress.     Breath sounds: Normal breath sounds. No decreased breath sounds, wheezing, rhonchi or rales.  Chest:     Chest wall: No tenderness.  Musculoskeletal:     Cervical back: Normal range of motion.     Right lower leg: No edema.     Left lower leg: No edema.  Skin:    General: Skin is warm and dry.     Nails: There is no clubbing.  Neurological:     Mental Status: She is alert and oriented to person, place, and time.  Psychiatric:        Speech: Speech normal.        Behavior: Behavior normal.        Thought Content: Thought content normal.        Judgment: Judgment normal.     Wt Readings from Last 3 Encounters:  08/20/23 173 lb 6.4 oz (78.7 kg)  05/22/23 169 lb (76.7 kg)   05/18/23 166 lb (75.3 kg)     ASSESSMENT & PLAN:   Multivessel CAD status post CABG with precordial pain: Currently without symptoms of chest pain or cardiac decompensation.  Schedule Lexiscan MPI to evaluate for high risk ischemia.  Continue aggressive risk factor modification and secondary prevention including aspirin  81 mg, Toprol -XL 25 mg, and rosuvastatin  20 mg.  Near-syncope: Place ZIO XT and obtain echo.  Ischemic evaluation as outlined above.  Postoperative Afib: No documented evidence of recurrence.  Outpatient cardiac monitoring as outlined above.  Remains on Toprol -XL.  HTN: Blood pressure is reasonably controlled in the office today.  She remains on losartan  50 mg and Toprol -XL 25 mg.  HLD: LDL 57.  Restart rosuvastatin  20 mg.   Informed Consent   Shared Decision Making/Informed Consent{  The risks [chest pain, shortness of breath, cardiac arrhythmias, dizziness, blood pressure fluctuations, myocardial infarction, stroke/transient ischemic attack, nausea, vomiting, allergic reaction, radiation exposure, metallic taste sensation and life-threatening complications (estimated to be 1 in 10,000)], benefits (risk stratification, diagnosing coronary artery disease, treatment guidance) and alternatives of a nuclear stress test were discussed in detail with Ms. Bacot and she agrees to proceed.        Disposition: F/u with Dr. Nolan Battle or an APP in 2 months.   Medication Adjustments/Labs and Tests Ordered: Current medicines are reviewed at length with the patient today.  Concerns regarding medicines are outlined above. Medication changes, Labs and Tests ordered today are summarized above and listed in the Patient Instructions accessible in Encounters.   Signed, Varney Gentleman, PA-C 08/20/2023 12:53 PM     [facility] [address] [place], [place] [address] [phone]

## 2023-08-20 ENCOUNTER — Ambulatory Visit

## 2023-08-20 ENCOUNTER — Ambulatory Visit: Admitting: Nurse Practitioner

## 2023-08-20 ENCOUNTER — Encounter: Payer: Self-pay | Admitting: Physician Assistant

## 2023-08-20 ENCOUNTER — Ambulatory Visit: Attending: Physician Assistant | Admitting: Physician Assistant

## 2023-08-20 VITALS — BP 136/68 | HR 59 | Ht 65.0 in | Wt 173.4 lb

## 2023-08-20 DIAGNOSIS — E785 Hyperlipidemia, unspecified: Secondary | ICD-10-CM

## 2023-08-20 DIAGNOSIS — R55 Syncope and collapse: Secondary | ICD-10-CM

## 2023-08-20 DIAGNOSIS — I25118 Atherosclerotic heart disease of native coronary artery with other forms of angina pectoris: Secondary | ICD-10-CM

## 2023-08-20 DIAGNOSIS — R072 Precordial pain: Secondary | ICD-10-CM

## 2023-08-20 DIAGNOSIS — I9789 Other postprocedural complications and disorders of the circulatory system, not elsewhere classified: Secondary | ICD-10-CM

## 2023-08-20 DIAGNOSIS — I2510 Atherosclerotic heart disease of native coronary artery without angina pectoris: Secondary | ICD-10-CM

## 2023-08-20 DIAGNOSIS — I4891 Unspecified atrial fibrillation: Secondary | ICD-10-CM

## 2023-08-20 DIAGNOSIS — I10 Essential (primary) hypertension: Secondary | ICD-10-CM

## 2023-08-20 DIAGNOSIS — Z951 Presence of aortocoronary bypass graft: Secondary | ICD-10-CM | POA: Diagnosis not present

## 2023-08-20 MED ORDER — FUROSEMIDE 20 MG PO TABS: 20.0000 mg | ORAL_TABLET | Freq: Every day | ORAL | 3 refills | Status: AC | PRN

## 2023-08-20 MED ORDER — ROSUVASTATIN CALCIUM 20 MG PO TABS: 20.0000 mg | ORAL_TABLET | Freq: Every day | ORAL | 3 refills | Status: AC

## 2023-08-20 NOTE — Patient Instructions (Signed)
 Medication Instructions:  Your physician recommends that you continue on your current medications as directed. Please refer to the Current Medication list given to you today.   *If you need a refill on your cardiac medications before your next appointment, please call your pharmacy*  Lab Work: None ordered at this time   Testing/Procedures: Your provider has ordered a Lexiscan/ Exercise Myoview Stress test. This will take place at [facility]. Please report to the [facility] medical mall entrance. The volunteers at the first desk will direct you where to go.  [facility] MYOVIEW  Your provider has ordered a Stress Test with nuclear imaging. The purpose of this test is to evaluate the blood supply to your heart muscle. This procedure is referred to as a "Non-Invasive Stress Test." This is because other than having an IV started in your vein, nothing is inserted or "invades" your body. Cardiac stress tests are done to find areas of poor blood flow to the heart by determining the extent of coronary artery disease (CAD). Some patients exercise on a treadmill, which naturally increases the blood flow to your heart, while others who are unable to walk on a treadmill due to physical limitations will have a pharmacologic/chemical stress agent called Lexiscan . This medicine will mimic walking on a treadmill by temporarily increasing your coronary blood flow.   Please note: these test may take anywhere between 2-4 hours to complete  How to prepare for your Myoview test:  Nothing to eat for 6 hours prior to the test No caffeine for 24 hours prior to test No smoking 24 hours prior to test. Your medication may be taken with water.  If your doctor stopped a medication because of this test, do not take that medication. Ladies, please do not wear dresses.  Skirts or pants are appropriate. Please wear a short sleeve shirt. No perfume, cologne or lotion. Wear comfortable walking shoes. No heels!   PLEASE NOTIFY THE OFFICE AT  LEAST 24 HOURS IN ADVANCE IF YOU ARE UNABLE TO KEEP YOUR APPOINTMENT.  [phone] AND  PLEASE NOTIFY NUCLEAR MEDICINE AT [facility] AT LEAST 24 HOURS IN ADVANCE IF YOU ARE UNABLE TO KEEP YOUR APPOINTMENT. [phone]  Your physician has recommended that you wear a Zio monitor.   This monitor is a medical device that records the heart's electrical activity. Doctors most often use these monitors to diagnose arrhythmias. Arrhythmias are problems with the speed or rhythm of the heartbeat. The monitor is a small device applied to your chest. You can wear one while you do your normal daily activities. While wearing this monitor if you have any symptoms to push the button and record what you felt. Once you have worn this monitor for the period of time provider prescribed (Usually 14 days), you will return the monitor device in the postage paid box. Once it is returned they will download the data collected and provide us  with a report which the provider will then review and we will call you with those results. Important tips:  Avoid showering during the first 24 hours of wearing the monitor. Avoid excessive sweating to help maximize wear time. Do not submerge the device, no hot tubs, and no swimming pools. Keep any lotions or oils away from the patch. After 24 hours you may shower with the patch on. Take brief showers with your back facing the shower head.  Do not remove patch once it has been placed because that will interrupt data and decrease adhesive wear time. Push the button  when you have any symptoms and write down what you were feeling. Once you have completed wearing your monitor, remove and place into box which has postage paid and place in your outgoing mailbox.  If for some reason you have misplaced your box then call our office and we can provide another box and/or mail it off for you.  Your physician has requested that you have an echocardiogram. Echocardiography is a painless test that uses  sound waves to create images of your heart. It provides your doctor with information about the size and shape of your heart and how well your heart's chambers and valves are working.   You may receive an ultrasound enhancing agent through an IV if needed to better visualize your heart during the echo. This procedure takes approximately one hour.  There are no restrictions for this procedure.  This will take place at [address] [facility] [address] [address], [place] [address]  Please note: We ask at that you not bring children with you during ultrasound (echo/ vascular) testing. Due to room size and safety concerns, children are not allowed in the ultrasound rooms during exams. Our front office staff cannot provide observation of children in our lobby area while testing is being conducted. An adult accompanying a patient to their appointment will only be allowed in the ultrasound room at the discretion of the ultrasound technician under special circumstances. We apologize for any inconvenience.   Follow-Up: At [facility], you and your health needs are our priority.  As part of our continuing mission to provide you with exceptional heart care, our providers are all part of one team.  This team includes your primary Cardiologist (physician) and Advanced Practice Providers or APPs (Physician Assistants and Nurse Practitioners) who all work together to provide you with the care you need, when you need it.  Your next appointment:   2 month(s)  Provider:   You may see Sammy Crisp, MD or Varney Gentleman, PA-C

## 2023-08-21 ENCOUNTER — Ambulatory Visit: Admitting: Nurse Practitioner

## 2023-08-24 ENCOUNTER — Encounter: Payer: Self-pay | Admitting: Family Medicine

## 2023-08-24 ENCOUNTER — Ambulatory Visit (INDEPENDENT_AMBULATORY_CARE_PROVIDER_SITE_OTHER): Admitting: Family Medicine

## 2023-08-24 VITALS — BP 128/64 | HR 54 | Temp 98.0°F | Ht 65.0 in | Wt 173.0 lb

## 2023-08-24 DIAGNOSIS — K219 Gastro-esophageal reflux disease without esophagitis: Secondary | ICD-10-CM

## 2023-08-24 DIAGNOSIS — R053 Chronic cough: Secondary | ICD-10-CM

## 2023-08-24 DIAGNOSIS — R0602 Shortness of breath: Secondary | ICD-10-CM

## 2023-08-24 MED ORDER — RABEPRAZOLE SODIUM 20 MG PO TBEC: 20.0000 mg | DELAYED_RELEASE_TABLET | Freq: Every day | ORAL | 1 refills | Status: DC

## 2023-08-24 NOTE — Patient Instructions (Addendum)
 I sent the aciphex  rx.  If that isn't helping then let us  know.  I would try using fluticasone -salmeterol twice a day, rinse after use.  Take care.  Glad to see you.

## 2023-08-24 NOTE — Progress Notes (Unsigned)
 Echo and cardiac testing pending.  D/w pt.    She had been off aciphex  recently.  It helped prev. D/w pt that I would check on the refill.  Sent at [facility].   She had used lasix  frequently in the last few weeks but hasn't needed it daily the last few days.  Not lightheaded.    Mood d/w pt.  I'm not a high nervous person.  Daughter and grandson have health concerns, she lives with them.  She is okay and dealing with with stressors.    Cough.  Had been better.  She thought it was better but has some cough during the interview.  It was clearly worse a few weeks ago.  Minimal wheeze.  Still on wixela once daily with SABA once daily, but not together.  Discussed with patient about routine Wixela dosing.  Meds, vitals, and allergies reviewed.   ROS: Per HPI unless specifically indicated in ROS section   Heart monitor in place.   Nad Ncat Neck supple, no LA Rrr Ctab Occasional dry cough during the exam but not in distress. No wheeze. Abdomen soft. Skin well-perfused. No extremity edema noted.

## 2023-08-25 NOTE — Assessment & Plan Note (Signed)
 History of.  Discussed options.  Lungs are clear.  Okay for outpatient follow-up. Restart Aciphex , prescription sent.  If that isn't helping then let us  know.  I would try using fluticasone -salmeterol twice a day, rinse after use.  Rationale discussed with patient.  Update us  as needed.

## 2023-09-03 ENCOUNTER — Other Ambulatory Visit

## 2023-09-10 ENCOUNTER — Ambulatory Visit: Attending: Physician Assistant

## 2023-09-10 DIAGNOSIS — R072 Precordial pain: Secondary | ICD-10-CM | POA: Diagnosis not present

## 2023-09-11 ENCOUNTER — Ambulatory Visit: Payer: Self-pay | Admitting: Physician Assistant

## 2023-09-11 DIAGNOSIS — G479 Sleep disorder, unspecified: Secondary | ICD-10-CM

## 2023-09-11 LAB — ECHOCARDIOGRAM COMPLETE
AR max vel: 2.18 cm2
AV Area VTI: 2.09 cm2
AV Area mean vel: 2.05 cm2
AV Mean grad: 4 mmHg
AV Peak grad: 6.8 mmHg
Ao pk vel: 1.3 m/s
Area-P 1/2: 3.65 cm2
Calc EF: 65.1 %
S' Lateral: 2.9 cm
Single Plane A2C EF: 62.8 %
Single Plane A4C EF: 66.4 %

## 2023-09-15 DIAGNOSIS — R55 Syncope and collapse: Secondary | ICD-10-CM | POA: Diagnosis not present

## 2023-09-22 DIAGNOSIS — R55 Syncope and collapse: Secondary | ICD-10-CM

## 2023-09-28 ENCOUNTER — Ambulatory Visit: Payer: Self-pay | Admitting: *Deleted

## 2023-09-28 NOTE — Telephone Encounter (Signed)
 Reason for Disposition  Earache  (Exceptions: Brief ear pain of lasting less than 60 minutes, or earache occurring during air travel.)  Answer Assessment - Initial Assessment Questions 1. LOCATION: Which ear is involved?     Left ear pain down into neck.   I told the first lady all of this.   I don't need to repeat myself.   Pt was very short and curt would not answer any triage questions.   Just kept saying,   I need an appt for Monday with [facility].   There were no appts with any of the providers at [facility] until [place]. October 04, 2023.    I encouraged her to go to the urgent care.   She said something about calling her cardiologist about some test results.   I let her know that would be fine but regarding her ear pain to be seen at the urgent care so she would not have to go the whole weekend in pain.    She said, Never mind.   And ended the call. 2. ONSET: When did the ear pain start?      Did not want to be triaged.  See notes given to the Patient Access Specialist at the beginning of the call before she was transferred to me. 3. SEVERITY: How bad is the pain?  (Scale 1-10; mild, moderate or severe)      4. URI SYMPTOMS: Do you have a runny nose or cough?      5. FEVER: Do you have a fever? If Yes, ask: What is your temperature, how was it measured, and when did it start?      6. CAUSE: Have you been swimming recently?, How often do you use Q-TIPS?, Have you had any recent air travel or scuba diving?      7. OTHER SYMPTOMS: Do you have any other symptoms? (e.g., decreased hearing, dizziness, headache, stiff neck, vomiting)      8. PREGNANCY: Is there any chance you are pregnant? When was your last menstrual period?  Protocols used: Earache-A-AH FYI Only or Action Required?: FYI only for provider.  Patient was last seen in primary care on 08/24/2023 by Cleatus Arlyss RAMAN, MD.  Called Nurse Triage reporting Otalgia.See information given to the Patient  Access Specialist.  Refused triage questions.  I just want an appt for Monday.   No appts with any of the providers until [place]. July 24th.   I referred her to the urgent care.   She said,  never mind and ended the call.    Symptoms began today.  See notes from Patient Access Specialist  Interventions attempted: Nothing.  Symptoms are: rapidly worsening.  Triage Disposition: See Physician Within 24 Hours  Patient/caregiver understands and will follow disposition?: Unsure Pt ended call when I let her know there were no appts available.  I referred her to the urgent care but she ended the call when I told her this.

## 2023-09-28 NOTE — Telephone Encounter (Signed)
 We can call and offer her a appt with anyone that has an opening

## 2023-10-02 NOTE — Progress Notes (Unsigned)
     Quayshawn Nin T. Shimshon Narula, MD, CAQ Sports Medicine [facility] [address] [place], [address]  Phone: [phone]  FAX: [phone]  Christine Virginia  [name] - 89 y.o. female  [name]  Date of Birth: 1933/08/28  Date: 10/04/2023  PCP: Wendee Lynwood HERO, NP  Referral: Wendee Lynwood HERO, NP  No chief complaint on file.  Subjective:   Christine Virginia  [name] is a 89 y.o. very pleasant female patient with There is no height or weight on file to calculate BMI. who presents with the following:  The patient has a sensation of her ear being clogged.    Review of Systems is noted in the HPI, as appropriate  Objective:   There were no vitals taken for this visit.  GEN: No acute distress; alert,appropriate. PULM: Breathing comfortably in no respiratory distress PSYCH: Normally interactive.   Laboratory and Imaging Data:  Assessment and Plan:   ***

## 2023-10-04 ENCOUNTER — Encounter: Payer: Self-pay | Admitting: Family Medicine

## 2023-10-04 ENCOUNTER — Ambulatory Visit (INDEPENDENT_AMBULATORY_CARE_PROVIDER_SITE_OTHER): Admitting: Family Medicine

## 2023-10-04 VITALS — BP 140/70 | HR 61 | Temp 98.4°F | Ht 65.0 in | Wt 174.0 lb

## 2023-10-04 DIAGNOSIS — H6992 Unspecified Eustachian tube disorder, left ear: Secondary | ICD-10-CM | POA: Diagnosis not present

## 2023-10-04 NOTE — Patient Instructions (Signed)
 Flonase  (Fluticasone ) - 2 sprays once a day in each nostril

## 2023-10-09 ENCOUNTER — Ambulatory Visit: Payer: Self-pay

## 2023-10-09 NOTE — Telephone Encounter (Signed)
 FYI Only or Action Required?: FYI only for provider.  Patient was last seen in primary care on 10/04/2023 by Watt Mirza, MD.  Called Nurse Triage reporting No chief complaint on file..  Symptoms began a week ago.  Interventions attempted: Rest, hydration, or home remedies.  Symptoms are: unchanged.  Triage Disposition: See Physician Within 24 Hours  Patient/caregiver understands and will follow disposition?: Yes   Copied from CRM [id number]. Topic: Clinical - Red Word Triage >> Oct 09, 2023  1:47 PM Jayma L wrote: Red Word that prompted transfer to Nurse Triage: patient was seen recently for ear pain , said its on the left side and its now going down her left arm , left shoulder and left side of breast and its worsening. Asking to be scheduled please Reason for Disposition  Earache  (Exceptions: Brief ear pain of lasting less than 60 minutes, or earache occurring during air travel.)  Answer Assessment - Initial Assessment Questions 1. LOCATION: Which ear is involved?     Left Ear Pain, Down to her arm, shoulder, breast  2. ONSET: When did the ear pain start?      10-04-2023  3. SEVERITY: How bad is the pain?  (Scale 1-10; mild, moderate or severe)     Moderate  4. URI SYMPTOMS: Do you have a runny nose or cough?     Denies  5. FEVER: Do you have a fever? If Yes, ask: What is your temperature, how was it measured, and when did it start?     Denies  6. CAUSE: Have you been swimming recently?, How often do you use Q-TIPS?, Have you had any recent air travel or scuba diving?     No  7. OTHER SYMPTOMS: Do you have any other symptoms? (e.g., decreased hearing, dizziness, headache, stiff neck, vomiting)     Pain with positioning  8. PREGNANCY: Is there any chance you are pregnant? When was your last menstrual period?     No and No  Protocols used: Earache-A-AH

## 2023-10-10 ENCOUNTER — Encounter: Payer: Self-pay | Admitting: Family Medicine

## 2023-10-10 ENCOUNTER — Ambulatory Visit (INDEPENDENT_AMBULATORY_CARE_PROVIDER_SITE_OTHER): Admitting: Family Medicine

## 2023-10-10 VITALS — BP 122/82 | HR 60 | Temp 98.2°F | Ht 65.0 in | Wt 178.4 lb

## 2023-10-10 DIAGNOSIS — H6990 Unspecified Eustachian tube disorder, unspecified ear: Secondary | ICD-10-CM

## 2023-10-10 DIAGNOSIS — I10 Essential (primary) hypertension: Secondary | ICD-10-CM

## 2023-10-10 DIAGNOSIS — R0789 Other chest pain: Secondary | ICD-10-CM

## 2023-10-10 DIAGNOSIS — Z951 Presence of aortocoronary bypass graft: Secondary | ICD-10-CM | POA: Diagnosis not present

## 2023-10-10 MED ORDER — METHOCARBAMOL 500 MG PO TABS: 500.0000 mg | ORAL_TABLET | Freq: Two times a day (BID) | ORAL | 0 refills | Status: DC | PRN

## 2023-10-10 NOTE — Assessment & Plan Note (Signed)
 This is better after flonase  use.

## 2023-10-10 NOTE — Patient Instructions (Addendum)
 EKG today  Suspicious for pectoral or other chest wall strain.  May try muscle relaxant sent to pharmacy - caution with sedation on these medications. May use heating pad to tender area, not directly on the skin, may use topical rub like icy hot or arnica.  Let us  know if not improving with treatment, or if new or worsening symptoms develop.

## 2023-10-10 NOTE — Assessment & Plan Note (Addendum)
 Overall describes non-cardiac pain as it's not exertional, not substernal pressure tightness.  In CABG hx, update EKG - stable, no signs of acute ischemic changes or pericarditis.  Suspect musculoskeletal cause such as pectoralis mm strain after lifting heavy bassinet box. Normotensive, normal oxygen.  She did have severe chest pain 2d ago that has been improving ever since with supportive measures. Continue this including gentle stretching, heating pad, topical muscle rubs. Will also Rx robaxin  muscle relaxant with sedation precautions. Extensively reviewed reasons to seek further care, to notify us  if not continuing to improve as expected.

## 2023-10-10 NOTE — Telephone Encounter (Signed)
 Agree with being evaluated. Appreciate Dr. Talmadge assessment

## 2023-10-10 NOTE — Progress Notes (Signed)
 Ph: [phone] Fax: [phone]   Patient ID: Christine Virginia  [name], female    DOB: 05-02-1933, 89 y.o.   MRN: [id number]  This visit was conducted in person.  BP 122/82   Pulse 60   Temp 98.2 F (36.8 C) (Oral)   Ht 5' 5 (1.651 m)   Wt 178 lb 6 oz (80.9 kg)   SpO2 98%   BMI 29.68 kg/m    CC: L neck and shoulder pain into chest Subjective:   HPI: Christine Virginia  [name] is a 89 y.o. female presenting on 10/10/2023 for Medical Management of Chronic Issues, Neck Pain (Pt c/o shape pain when turn to the left. Also pain across the left front breast area.), and Shoulder Pain (Pt c/o pain between shoulder blade)   Several weeks ago developed left ear pain associated with left sided headache.   Seen last week with L earache dx ETD treated with flonase  with benefit. She's been taking ibuprofen 400mg  TID to QID, as well as icy hot. NSAID didn't really help.   Went to [place]  for grandson's baby shower (1st great grandson). She did all the driving.  She did lift a heavy bassinet box Saturday night to put into car.  She felt well on Sunday.  On Monday morning when she awoke at hotel she developed sudden L shoulder pain with radiation down chest to lateral ribcage and to back. Pain radiates down arm to L elbow - worse pain when turning to the left.  Notes high tolerance for pain. So painful she cried on drive back on Monday.  Denies other inciting trauma/injury or falls.   Symptoms are improving over the past 2 days but still with persistent discomfort. No significant dyspnea, palpitations, dizziness, abd pain She was recently taken off metoprolol   GERD well managed on aciphex .  Asthma on advair   No exertional pain, it does feel better when laying down on pillow.  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  She continues aspirin  and crestor .    No recent fevers/chills, cough, congestion, ST, PNDrainage.  No sick contacts at home.  Last saw cardiology 05/2023, next appt 11/19/2023 with  upcoming nuclear stress test.   H/o sinus infections - this feels different.  H/o bilateral mastectomies.      Relevant past medical, surgical, family and social history reviewed and updated as indicated. Interim medical history since our last visit reviewed. Allergies and medications reviewed and updated. Outpatient Medications Prior to Visit  Medication Sig Dispense Refill   albuterol  (VENTOLIN  HFA) 108 (90 Base) MCG/ACT inhaler Inhale 2 puffs into the lungs every 6 (six) hours as needed for wheezing or shortness of breath. 8 g 2   aspirin  EC 81 MG tablet Take 1 tablet (81 mg total) by mouth daily. Swallow whole. 90 tablet 3   fluticasone -salmeterol (WIXELA INHUB) 250-50 MCG/ACT AEPB Inhale 1 puff into the lungs in the morning and at bedtime.     furosemide  (LASIX ) 20 MG tablet Take 1 tablet (20 mg total) by mouth daily as needed (for shortness of breath). 90 tablet 3   levothyroxine  (SYNTHROID ) 75 MCG tablet Take 1 tablet (75 mcg total) by mouth daily. 90 tablet 0   loratadine  (CLARITIN ) 10 MG tablet Take 10 mg by mouth daily as needed for allergies.     losartan  (COZAAR ) 50 MG tablet Take 1 tablet (50 mg total) by mouth daily. 90 tablet 3   RABEprazole  (ACIPHEX ) 20 MG tablet Take 1 tablet (20 mg total) by mouth daily. 90 tablet 1  rosuvastatin  (CRESTOR ) 20 MG tablet Take 1 tablet (20 mg total) by mouth daily. 90 tablet 3   No facility-administered medications prior to visit.     Per HPI unless specifically indicated in ROS section below Review of Systems  Objective:  BP 122/82   Pulse 60   Temp 98.2 F (36.8 C) (Oral)   Ht 5' 5 (1.651 m)   Wt 178 lb 6 oz (80.9 kg)   SpO2 98%   BMI 29.68 kg/m   Wt Readings from Last 3 Encounters:  10/10/23 178 lb 6 oz (80.9 kg)  10/04/23 174 lb (78.9 kg)  08/24/23 173 lb (78.5 kg)      Physical Exam Vitals and nursing note reviewed.  Constitutional:      Appearance: Normal appearance. She is not ill-appearing.  HENT:     Head:  Normocephalic and atraumatic.     Right Ear: Tympanic membrane, ear canal and external ear normal. There is no impacted cerumen.     Left Ear: Tympanic membrane, ear canal and external ear normal. There is no impacted cerumen.     Nose: Nose normal. No congestion or rhinorrhea.     Mouth/Throat:     Mouth: Mucous membranes are moist.     Pharynx: Oropharynx is clear. No oropharyngeal exudate or posterior oropharyngeal erythema.  Eyes:     Extraocular Movements: Extraocular movements intact.     Conjunctiva/sclera: Conjunctivae normal.     Pupils: Pupils are equal, round, and reactive to light.  Neck:     Vascular: Carotid bruit (bilat) present.  Cardiovascular:     Rate and Rhythm: Normal rate and regular rhythm.     Pulses: Normal pulses.     Heart sounds: Normal heart sounds. No murmur heard. Pulmonary:     Effort: Pulmonary effort is normal. No respiratory distress.     Breath sounds: Normal breath sounds. No wheezing, rhonchi or rales.     Comments:  No reproducible chest tenderness to palpation at sternum, at costochondral junction, or at ribcage Positional discomfort - worse with twisting ot left, worse with elevating arm in abduction past 90 degrees Chest:     Chest wall: No tenderness.  Abdominal:     Tenderness: There is no right CVA tenderness or left CVA tenderness.  Musculoskeletal:     Cervical back: Normal range of motion and neck supple.     Right lower leg: No edema.     Left lower leg: No edema.  Lymphadenopathy:     Head:     Right side of head: No submental, submandibular, tonsillar, preauricular or posterior auricular adenopathy.     Left side of head: No submental, submandibular, tonsillar, preauricular or posterior auricular adenopathy.     Cervical: No cervical adenopathy.     Right cervical: No superficial cervical adenopathy.    Left cervical: No superficial cervical adenopathy.     Upper Body:     Right upper body: No supraclavicular adenopathy.      Left upper body: No supraclavicular adenopathy.  Skin:    General: Skin is warm and dry.     Findings: No rash.  Neurological:     Mental Status: She is alert.  Psychiatric:        Mood and Affect: Mood normal.        Behavior: Behavior normal.       Results for orders placed or performed in visit on 09/10/23  ECHOCARDIOGRAM COMPLETE   Collection Time: 09/10/23  9:44 AM  Result Value Ref Range   AR max vel 2.18 cm2   AV Peak grad 6.8 mmHg   Ao pk vel 1.30 m/s   S' Lateral 2.90 cm   Area-P 1/2 3.65 cm2   AV Area VTI 2.09 cm2   AV Mean grad 4.0 mmHg   Single Plane A4C EF 66.4 %   Single Plane A2C EF 62.8 %   Calc EF 65.1 %   AV Area mean vel 2.05 cm2   Est EF 60 - 65%    EKG - sinus bradycardia rate high 50s, occ PVC, normal axis, intervals, no hypertrophy or acute ST/T changes.   Assessment & Plan:   Problem List Items Addressed This Visit     Essential hypertension   Atypical chest pain - Primary   Overall describes non-cardiac pain as it's not exertional, not substernal pressure tightness.  In CABG hx, update EKG - stable, no signs of acute ischemic changes or pericarditis.  Suspect musculoskeletal cause such as pectoralis mm strain after lifting heavy bassinet box. Normotensive, normal oxygen.  She did have severe chest pain 2d ago that has been improving ever since with supportive measures. Continue this including gentle stretching, heating pad, topical muscle rubs. Will also Rx robaxin  muscle relaxant with sedation precautions. Extensively reviewed reasons to seek further care, to notify us  if not continuing to improve as expected.       Relevant Orders   EKG 12-Lead   ETD (eustachian tube dysfunction)   This is better after flonase  use.       S/P CABG x 4     Meds ordered this encounter  Medications   methocarbamol  (ROBAXIN ) 500 MG tablet    Sig: Take 1-2 tablets (500-1,000 mg total) by mouth 2 (two) times daily as needed for muscle spasms (sedation  precautions).    Dispense:  30 tablet    Refill:  0    Orders Placed This Encounter  Procedures   EKG 12-Lead    Patient Instructions  EKG today  Suspicious for pectoral strain  May try muscle relaxant sent to pharmacy - caution with sedation on these medications. May continue topical rub like icy hot or arnica.   Follow up plan: Return if symptoms worsen or fail to improve.  Anton Blas, MD

## 2023-11-01 ENCOUNTER — Ambulatory Visit (INDEPENDENT_AMBULATORY_CARE_PROVIDER_SITE_OTHER): Admitting: Sleep Medicine

## 2023-11-01 ENCOUNTER — Encounter: Payer: Self-pay | Admitting: Sleep Medicine

## 2023-11-01 VITALS — BP 120/68 | HR 69 | Temp 97.9°F | Ht 65.0 in | Wt 172.2 lb

## 2023-11-01 DIAGNOSIS — I10 Essential (primary) hypertension: Secondary | ICD-10-CM

## 2023-11-01 DIAGNOSIS — G4733 Obstructive sleep apnea (adult) (pediatric): Secondary | ICD-10-CM

## 2023-11-01 NOTE — Progress Notes (Signed)
 Name:Christine [name] MRN: [id number] DOB: 05/04/1933   CHIEF COMPLAINT:  EXCESSIVE DAYTIME SLEEPINESS   HISTORY OF PRESENT ILLNESS:  Christine [name] is a 89 y.o. w/ a h/o HTN, CAD s/p CABG, hyperlipidemia, GERD and hypothyroidism who presents for c/o excessive daytime sleepiness which has been present for several years. Reports nocturnal awakenings due to nocturia, however does not have difficulty falling back to sleep. Reports a 20 weight gain over the last few years. Denies morning headaches, RLS symptoms, dream enactment, cataplexy, hypnagogic or hypnapompic hallucinations. Denies a family history of sleep apnea. Denies drowsy driving. Drinks a few glasses of tea weekly, denies alcohol, tobacco or illicit drug use.   Bedtime 10-11 pm Sleep onset 20 mins Rise time 7:30-9 am   EPWORTH SLEEP SCORE 3    11/01/2023   10:24 AM 09/27/2021   11:00 AM  Results of the Epworth flowsheet  Sitting and reading 1 3  Watching TV 1 3  Sitting, inactive in a public place (e.g. a theatre or a meeting) 0 0  As a passenger in a car for an hour without a break 0 3  Lying down to rest in the afternoon when circumstances permit 1 3  Sitting and talking to someone 0 2  Sitting quietly after a lunch without alcohol 0 2  In a car, while stopped for a few minutes in traffic 0 0  Total score 3 16    PAST MEDICAL HISTORY :   has a past medical history of Asthma, Carotid arterial disease (HCC), Chicken pox, Diastolic dysfunction, Frequent headaches, GERD (gastroesophageal reflux disease), Heart murmur, Hyperlipidemia, Hypertension, Hypothyroidism, Nonobstructive CAD (coronary artery disease), Rheumatic fever, and Urine incontinence.  has a past surgical history that includes Abdominal hysterectomy; Appendectomy; Breast biopsy; Mastectomy (Bilateral, 04/1979); LEFT HEART CATH AND CORONARY ANGIOGRAPHY (N/A, 12/11/2022); Coronary artery bypass graft (N/A, 12/13/2022); and TEE without  cardioversion (N/A, 12/13/2022). Prior to Admission medications   Medication Sig Start Date End Date Taking? Authorizing Provider  albuterol  (VENTOLIN  HFA) 108 (90 Base) MCG/ACT inhaler Inhale 2 puffs into the lungs every 6 (six) hours as needed for wheezing or shortness of breath. 09/27/21   Velma Raisin, MD  aspirin  EC 81 MG tablet Take 1 tablet (81 mg total) by mouth daily. Swallow whole. 04/16/20   End, Lonni, MD  fluticasone -salmeterol (WIXELA INHUB) 250-50 MCG/ACT AEPB Inhale 1 puff into the lungs in the morning and at bedtime.    [provider]  furosemide  (LASIX ) 20 MG tablet Take 1 tablet (20 mg total) by mouth daily as needed (for shortness of breath). 08/20/23 11/18/23  Abigail Bernardino HERO, PA-C  levothyroxine  (SYNTHROID ) 75 MCG tablet Take 1 tablet (75 mcg total) by mouth daily. 05/10/23   Wendee Lynwood HERO, NP  loratadine  (CLARITIN ) 10 MG tablet Take 10 mg by mouth daily as needed for allergies.    [provider]  losartan  (COZAAR ) 50 MG tablet Take 1 tablet (50 mg total) by mouth daily. 07/31/23   Dunn, Bernardino HERO, PA-C  methocarbamol  (ROBAXIN ) 500 MG tablet Take 1-2 tablets (500-1,000 mg total) by mouth 2 (two) times daily as needed for muscle spasms (sedation precautions). 10/10/23   Rilla Baller, MD  RABEprazole  (ACIPHEX ) 20 MG tablet Take 1 tablet (20 mg total) by mouth daily. 08/24/23   Cleatus Arlyss RAMAN, MD  rosuvastatin  (CRESTOR ) 20 MG tablet Take 1 tablet (20 mg total) by mouth daily. 08/20/23   Abigail Bernardino HERO, PA-C   Allergies  Allergen  Reactions   Atorvastatin      Myalgias Pt tolerates rosuvastatin  20 mg   Pollen Extract     Sneezing, coughing     FAMILY HISTORY:  family history includes Early death (age of onset: 59) in her mother; Heart attack (age of onset: 44) in her brother; Heart disease in her brother; Heart disease (age of onset: 63) in her brother; Kidney disease in her mother; Leukemia in her daughter; Other in her father; Valvular heart disease in her  daughter. SOCIAL HISTORY:  reports that she has never smoked. She has never used smokeless tobacco. She reports current alcohol use. She reports that she does not use drugs.   Review of Systems:  Gen:  Denies  fever, sweats, chills weight loss  HEENT: Denies blurred vision, double vision, ear pain, eye pain, hearing loss, nose bleeds, sore throat Cardiac:  No dizziness, chest pain or heaviness, chest tightness,edema, No JVD Resp:   No cough, -sputum production, -shortness of breath,-wheezing, -hemoptysis,  Gi: Denies swallowing difficulty, stomach pain, nausea or vomiting, diarrhea, constipation, bowel incontinence Gu:  Denies bladder incontinence, burning urine Ext:   Denies Joint pain, stiffness or swelling Skin: Denies  skin rash, easy bruising or bleeding or hives Endoc:  Denies polyuria, polydipsia , polyphagia or weight change Psych:   Denies depression, insomnia or hallucinations  Other:  All other systems negative  VITAL SIGNS: BP 120/68   Pulse 69   Temp 97.9 F (36.6 C)   Ht 5' 5 (1.651 m)   Wt 172 lb 3.2 oz (78.1 kg)   SpO2 95%   BMI 28.66 kg/m    Physical Examination:   General Appearance: No distress  EYES PERRLA, EOM intact.   NECK Supple, No JVD Pulmonary: normal breath sounds, No wheezing.  CardiovascularNormal S1,S2.  No m/r/g.   Abdomen: Benign, Soft, non-tender. Skin:   warm, no rashes, no ecchymosis  Extremities: normal, no cyanosis, clubbing. Neuro:without focal findings,  speech normal  PSYCHIATRIC: Mood, affect within normal limits.   ASSESSMENT AND PLAN  OSA I suspect that OSA is likely present due to clinical presentation. Discussed the consequences of untreated sleep apnea. Advised not to drive drowsy for safety of patient and others. Will complete further evaluation with a split night study and follow up to review results.    HTN Stable, on current management. Following with PCP.    MEDICATION ADJUSTMENTS/LABS AND TESTS  ORDERED: Recommend Sleep Study   Patient  satisfied with Plan of action and management. All questions answered  Follow up to review sleep study results and treatment plan.   I spent a total of 56 minutes reviewing chart data, face-to-face evaluation with the patient, counseling and coordination of care as detailed above.    Dakotah Orrego, M.D.  Sleep Medicine [facility]

## 2023-11-01 NOTE — Patient Instructions (Signed)
 Will complete in lab sleep study and follow up to review results.

## 2023-11-13 ENCOUNTER — Ambulatory Visit
Admission: RE | Admit: 2023-11-13 | Discharge: 2023-11-13 | Disposition: A | Discharge status: Home / Self Care | Source: Ambulatory Visit | Attending: Physician Assistant | Admitting: Physician Assistant

## 2023-11-13 DIAGNOSIS — R072 Precordial pain: Secondary | ICD-10-CM | POA: Insufficient documentation

## 2023-11-13 LAB — NM MYOCAR MULTI W/SPECT W/WALL MOTION / EF
LV dias vol: 103 mL (ref 46–106)
LV sys vol: 36 mL (ref 3.8–5.2)
Nuc Stress EF: 65 %
Peak HR: 90 {beats}/min
Rest HR: 56 {beats}/min
Rest Nuclear Isotope Dose: 10.8 mCi
SDS: 1
SRS: 0
SSS: 2
ST Depression (mm): 0 mm
Stress Nuclear Isotope Dose: 30 mCi
TID: 0.96

## 2023-11-13 MED ORDER — REGADENOSON 0.4 MG/5ML IV SOLN
0.4000 mg | Freq: Once | INTRAVENOUS | Status: AC
  Administered 2023-11-13: 0.4 mg via INTRAVENOUS
  Filled 2023-11-13: qty 5

## 2023-11-13 MED ORDER — TECHNETIUM TC 99M TETROFOSMIN IV KIT
10.0000 | PACK | Freq: Once | INTRAVENOUS | Status: AC | PRN
  Administered 2023-11-13: 10.83 via INTRAVENOUS

## 2023-11-13 MED ORDER — TECHNETIUM TC 99M TETROFOSMIN IV KIT
30.0400 | PACK | Freq: Once | INTRAVENOUS | Status: AC | PRN
  Administered 2023-11-13: 30.04 via INTRAVENOUS

## 2023-11-18 NOTE — Progress Notes (Unsigned)
 Cardiology Office Note    Date:  11/19/2023   ID:  Christine Virginia  [name], Bracknell 1934/02/20, [name]  PCP:  Wendee Lynwood HERO, NP  Cardiologist:  Lonni Hanson, MD  Electrophysiologist:  None   Chief Complaint: Follow up  History of Present Illness:   Christine Virginia  [name] is a 89 y.o. female with history of CAD s/p four-vessel CABG in 12/2022 (LIMA to LAD, sequential SVG to OM1/OM2, and SVG to distal RCA), postoperative A-fib/flutter, HTN, HLD, rheumatic fever, asthma, carotid artery disease, hypothyroidism, and GERD who presents for follow up of echo, Zio, and Lexiscan  MPI.   She previously established with cardiology in 02/2020 in the setting of orthopnea, lower extremity swelling, dizziness, fatigue, and orthostatic hypotension.  She reported at that time that she had previously undergone diagnostic cath in [place]  over 20 years prior and believed it had shown nonobstructive disease.  Given symptoms upon establishing with us , echo showed an EF of 60 to 65% with no regional wall motion abnormalities, grade 1 diastolic dysfunction, and mild to moderate aortic valve sclerosis without evidence of stenosis.  Coronary CTA in 03/2020 showed a calcium  score of 1844 (94th percentile) along with 50% proximal LAD stenosis and calcified stenosis in the RCA with normal FFR CT in the proximal RCA and a slow taper in the mid to distal segments from 0.8-0.6.  She was medically managed.  She underwent coronary CTA in 11/2022 that showed a calcium  score of 20-80 with greater than 70% proximal to mid LAD stenosis, 25 to 49% left main and LCx stenosis, and 50 to 69% proximal RCA stenosis.  ctFFR abnormal in the LAD and RCA.  Subsequent LHC on 12/11/2022 showed significant multivessel CAD as outlined below.  She was admitted to [facility] and underwent four-vessel CABG in 12/13/2022.  Pre-CABG echo showed an EF of 60 to 65%, no regional wall motion abnormalities, grade 1 diastolic dysfunction, normal RV  systolic function and ventricular cavity size, no significant valvular abnormalities, and an estimated right atrial pressure of 3 mmHg.  Postoperative course was complicated by PAF/flutter with spontaneous conversion to sinus rhythm prior to discharge.  She was not started on anticoagulation due to concerns about acute blood loss anemia.  She was seen in the office on 01/10/2023 noting ongoing indigestion that has been present since bypass surgery.  She also noted some improving shortness of breath with chest x-ray obtained at outside office the day prior showing persistent bilateral pleural effusions and consolidations/atelectasis.  She was prescribed furosemide , though had not started it yet.  She reported a diminished appetite.  She was seen in the office in 02/2023 with underlying fatigue improving.  Nausea resolved with discontinuation of amiodarone .    She was last seen in the office on 08/20/2023 and reported a couple episodes of chest discomfort occurring 2 weeks prior with associated shortness of breath and near syncope lasting for several minutes followed by spontaneous resolution and did not feel similar to what she experienced leading up to her CABG.  Echo in 08/2023 showed an EF of 60 to 65%, no regional wall motion abnormalities, mild LVH, normal LV diastolic function parameters, normal RV systolic function and ventricular cavity size, moderately dilated left atrium, trivial mitral regurgitation, aortic valve sclerosis without evidence of stenosis, and an estimated right atrial pressure of 3 mmHg.  Zio patch in 08/2023 showed a predominant rhythm of sinus with an average rate of 62 bpm, 12 episodes of SVT lasting up to 13 beats 1 episode  of sinus bradycardia with competing idioventricular rhythm but occasional PACs and PVCs, no sustained arrhythmias or prolonged pauses, and patient triggered events corresponding to sinus rhythm, PACs, and PVCs.  She was referred to pulmonology for consideration of sleep  study given bradycardic rates in the overnight hours.  Lexiscan  MPI in 11/2023 showed no significant ischemia or scar with an EF of 55 to 65% and was overall low risk.  She comes in doing well from a cardiac perspective and is without symptoms of angina or cardiac decompensation.  No dizziness, presyncope, or syncope.  She does report a history of chronic dry cough.  No significant lower extremity swelling or progressive orthopnea.  No falls or symptoms concerning for bleeding.  Has transitioned furosemide  from daily to every other day.  Feels like she is doing well from a cardiac perspective and does not have any acute concerns outside of dry cough at this time.   Labs independently reviewed: 05/2023 - Hgb 15.3, PLT 185, BNP 210, BUN 19, serum creatinine 0.94, potassium 4.9 12/2022 - magnesium  1.8, albumin  3.1, AST/ALT normal, A1c 5.7, LP(a) 37.7 11/2022 -TC 152, TG 174, HDL 60, LDL 57 08/2021 - TSH normal   Past Medical History:  Diagnosis Date   Asthma    Carotid arterial disease (HCC)    a. 06/2019 Carotid U/S: RICA 50-69%. Mod LICA plaque.   Chicken pox    Diastolic dysfunction    a. 02/2020 Echo: EF 60-65%, no rwma, GrI DD. Nl RV size/fxn. Triv MR. Mild-mod Ao sclerosis w/o stenois.   Frequent headaches    GERD (gastroesophageal reflux disease)    Heart murmur    a. 02/2020 Echo: Triv MR. Mild-mod Ao sclerosis w/o stenosis.   Hyperlipidemia    Hypertension    Hypothyroidism    Nonobstructive CAD (coronary artery disease)    a.  Remote Cath in [facility] - reportedly nonobs dzs; b. 03/2020 Cor CTA:  Ca2+ = 1844 (94th %'ile). LAD 50p, RCA 25-49 (FFRct nl in prox RCA w/ slow taper in mid-dist segments from 0.8-->0.6).   Rheumatic fever    Urine incontinence     Past Surgical History:  Procedure Laterality Date   ABDOMINAL HYSTERECTOMY     APPENDECTOMY     BREAST BIOPSY     CORONARY ARTERY BYPASS GRAFT N/A 12/13/2022   Procedure: CORONARY ARTERY BYPASS GRAFTING (CABG) TIMES FOUR UTILIZING  LEFT INTERNAL MAMMARY ARTERY AND ENDOSCOPIC VEIN HARVEST RIGHT GREATER SAPHENOUS VEIN;  Surgeon: Kerrin Elspeth BROCKS, MD;  Location: [facility] OR;  Service: Open Heart Surgery;  Laterality: N/A;   LEFT HEART CATH AND CORONARY ANGIOGRAPHY N/A 12/11/2022   Procedure: LEFT HEART CATH AND CORONARY ANGIOGRAPHY;  Surgeon: Mady Bruckner, MD;  Location: [facility] INVASIVE CV LAB;  Service: Cardiovascular;  Laterality: N/A;   MASTECTOMY Bilateral 04/1979   TEE WITHOUT CARDIOVERSION N/A 12/13/2022   Procedure: TRANSESOPHAGEAL ECHOCARDIOGRAM;  Surgeon: Kerrin Elspeth BROCKS, MD;  Location: [facility] OR;  Service: Open Heart Surgery;  Laterality: N/A;    Current Medications: Current Meds  Medication Sig   albuterol  (VENTOLIN  HFA) 108 (90 Base) MCG/ACT inhaler Inhale 2 puffs into the lungs every 6 (six) hours as needed for wheezing or shortness of breath.   amLODipine  (NORVASC ) 5 MG tablet Take 1 tablet (5 mg total) by mouth daily.   aspirin  EC 81 MG tablet Take 1 tablet (81 mg total) by mouth daily. Swallow whole.   fluticasone -salmeterol (WIXELA INHUB) 250-50 MCG/ACT AEPB Inhale 1 puff into the lungs in the morning and  at bedtime.   furosemide  (LASIX ) 20 MG tablet Take 1 tablet (20 mg total) by mouth daily as needed (for shortness of breath).   levothyroxine  (SYNTHROID ) 75 MCG tablet Take 1 tablet (75 mcg total) by mouth daily.   loratadine  (CLARITIN ) 10 MG tablet Take 10 mg by mouth daily as needed for allergies.   methocarbamol  (ROBAXIN ) 500 MG tablet Take 1-2 tablets (500-1,000 mg total) by mouth 2 (two) times daily as needed for muscle spasms (sedation precautions).   RABEprazole  (ACIPHEX ) 20 MG tablet Take 1 tablet (20 mg total) by mouth daily.   rosuvastatin  (CRESTOR ) 20 MG tablet Take 1 tablet (20 mg total) by mouth daily.   [DISCONTINUED] losartan  (COZAAR ) 50 MG tablet Take 1 tablet (50 mg total) by mouth daily.    Allergies:   Atorvastatin  and Pollen extract   Social History   Socioeconomic History    Marital status: Widowed    Spouse name: Not on file   Number of children: 4   Years of education: college   Highest education level: Not on file  Occupational History   Not on file  Tobacco Use   Smoking status: Never   Smokeless tobacco: Never  Vaping Use   Vaping status: Never Used  Substance and Sexual Activity   Alcohol use: Yes    Comment: maybe once a year   Drug use: Never   Sexual activity: Not Currently  Other Topics Concern   Not on file  Social History Narrative   06/03/19   From: [place]  originally, moved here to be near daughter   Living: with daughter - Manuelita [employer] sales representative)   Work: retired from Chemical engineer, Engineer, site      Family: 4 children - [place] and [place] in [place], daughter in [place] and has adopted daughter in [place]        Enjoys: play tennis, golf, walking, board games, gardening      Exercise: not currently - walking around the house   Diet: not as good as it should be, usually tries to be healthy, avoids fried foods, limits red meat      Safety   Seat belts: Yes    Guns: no   Safe in relationships: Yes    Social Drivers of Corporate investment banker Strain: Low Risk  (03/02/2023)   Overall Financial Resource Strain (CARDIA)    Difficulty of Paying Living Expenses: Not hard at all  Food Insecurity: No Food Insecurity (03/02/2023)   Hunger Vital Sign    Worried About Running Out of Food in the Last Year: Never true    Ran Out of Food in the Last Year: Never true  Transportation Needs: No Transportation Needs (03/02/2023)   PRAPARE - Administrator, Civil Service (Medical): No    Lack of Transportation (Non-Medical): No  Physical Activity: Inactive (03/02/2023)   Exercise Vital Sign    Days of Exercise per Week: 0 days    Minutes of Exercise per Session: 0 min  Stress: No Stress Concern Present (03/02/2023)   [employer] of Occupational Health - Occupational Stress Questionnaire    Feeling of Stress : Only a little   Social Connections: Moderately Isolated (03/02/2023)   Social Connection and Isolation Panel    Frequency of Communication with Friends and Family: More than three times a week    Frequency of Social Gatherings with Friends and Family: More than three times a week    Attends Religious Services: More than 4 times per year  Active Member of Clubs or Organizations: No    Attends Banker Meetings: Never    Marital Status: Widowed     Family History:  The patient's family history includes Early death (age of onset: 65) in her mother; Heart attack (age of onset: 80) in her brother; Heart disease in her brother; Heart disease (age of onset: 59) in her brother; Kidney disease in her mother; Leukemia in her daughter; Other in her father; Valvular heart disease in her daughter.  ROS:   12-point review of systems is negative unless otherwise noted in the HPI.   EKGs/Labs/Other Studies Reviewed:    Studies reviewed were summarized above. The additional studies were reviewed today:  Lexiscan  MPI 11/13/2023:   Normal pharmacologic myocardial perfusion stress test without evidence of significant ischemia or scar.   Left ventricular systolic function is normal (LVEF 55-65%).   Post-CABG findings are seen on the attenuation correction CT.   This is a low risk study. __________  Zio patch 08/2023:   The patient was monitored for 14 days.   The predominant rhythm was sinus with an average rate of 62 bpm (range 42-110 bpm in sinus).   There were occasional PACs and PVCs.   12 supraventricular runs occurred, lasting up to 13 beats with a maximum rate of 148 bpm.   An episode of sinus bradycardia with competing idioventricular rhythm was noted.   No sustained arrhythmia or prolonged pause was observed.   Patient triggered events correspond to sinus rhythm, PACs, and PVCs.   Predominantly sinus rhythm with occasional PACs and PVCs as well as several brief supraventricular runs.  Brief  episode of idioventricular rhythm also noted. __________  2D echo 09/10/2023: 1. Left ventricular ejection fraction, by estimation, is 60 to 65%. Left  ventricular ejection fraction by 2D MOD biplane is 65.1 %. The left  ventricle has normal function. The left ventricle has no regional wall  motion abnormalities. There is mild left  ventricular hypertrophy. Left ventricular diastolic parameters were  normal.   2. Right ventricular systolic function is normal. The right ventricular  size is normal.   3. Left atrial size was moderately dilated.   4. The mitral valve is normal in structure. Trivial mitral valve  regurgitation.   5. The aortic valve is tricuspid. Aortic valve regurgitation is not  visualized. Aortic valve sclerosis is present, with no evidence of aortic  valve stenosis.   6. The inferior vena cava is normal in size with greater than 50%  respiratory variability, suggesting right atrial pressure of 3 mmHg.  __________  Intraoperative TEE 12/13/2022: POST-OP IMPRESSIONS  _ Left Ventricle: The left ventricle is unchanged from pre-bypass. There  is  normal systolic function, with an estimad ejection fraction of 60%.  _ Right Ventricle: normal function.  _ Aorta: The aorta appears unchanged from pre-bypass.  _ Left Atrial Appendage: The left atrial appendage appears unchanged from  pre-bypass.  _ Aortic Valve: The aortic valve appears unchanged from pre-bypass.  _ Mitral Valve: There is mild regurgitation.  _ Tricuspid Valve: The tricuspid valve appears unchanged from pre-bypass.  _ Pulmonic Valve: The pulmonic valve appears unchanged from pre-bypass.  _ Comments:    Following coronary artery bypass grafting, the left ventricular systolic  function is normal. Right ventricular function is normal. There was mild  mitral  regurgitation post-bypass. The remainder of the valvular exam was  unchanged.  __________   Pre-CABG vascular ultrasound 12/12/2022: Summary:  Right  Carotid: Velocities in the  right ICA are consistent with a 40-59% stenosis. The ECA appears >50% stenosed.   Left Carotid: Velocities in the left ICA are consistent with a 1-39%  stenosis.  Vertebrals: Bilateral vertebral arteries demonstrate antegrade flow.  Subclavians: Right subclavian artery was stenotic. Normal flow  hemodynamics were seen in the left subclavian artery.   Right ABI: Resting right ankle-brachial index is within normal range.  Left ABI: Resting left ankle-brachial index is within normal range.  Bilateral Extremity: Doppler waveforms remain within normal limits with  compression bilaterally for the radial arteries. Doppler waveforms remain  within normal limits with compression bilaterally for the ulnar arteries.  __________   2D echo 12/12/2022: 1. Left ventricular ejection fraction, by estimation, is 60 to 65%. The  left ventricle has normal function. The left ventricle has no regional  wall motion abnormalities. Left ventricular diastolic parameters are  consistent with Grade I diastolic  dysfunction (impaired relaxation).   2. Right ventricular systolic function is normal. The right ventricular  size is normal. Tricuspid regurgitation signal is inadequate for assessing  PA pressure.   3. The mitral valve is normal in structure. No evidence of mitral valve  regurgitation. No evidence of mitral stenosis.   4. The aortic valve is tricuspid. Aortic valve regurgitation is not  visualized. No aortic stenosis is present.   5. The inferior vena cava is normal in size with greater than 50%  respiratory variability, suggesting right atrial pressure of 3 mmHg.  __________   [facility] 12/11/2022: Conclusions: Significant multivessel coronary artery disease, including sequential 60% ostial and 50% mid LAD stenoses, complex ostial LCx stenosis involving trifurcation of OM1, OM2, and LCx proper with hazy 90% stenosis, and diffuse calcified RCA disease of up to 70%. Normal left  ventricular systolic function (LVEF 55-65%) and filling pressure (LVEDP 10 mmHg).   Recommendations: Given severe multivessel CAD and continued vague chest pain at rest and with activity, we will admit Ms. Osa for medical optimization and cardiac surgery consultation for CABG. Continue aggressive secondary prevention of coronary artery disease. Obtain echocardiogram. __________   Coronary CTA 11/30/2022: FINDINGS: Aorta:  Normal size.  Aortic wall calcifications.  No dissection.   Aortic Valve:  Trileaflet.  No calcifications.   Coronary Arteries:  Normal coronary origin.  Right dominance.   RCA is a dominant artery. There is calcified plaque proximally causing moderate stenosis (50-69%).   Left main gives rise to LAD and LCX arteries. LM has calcified plaque proximally causing mild stenosis (25-49%).   LAD has heavily calcified plaque proximally causing severe stenosis (>70%).   LCX is a non-dominant artery. There is calcified plaque proximally causing mild stenosis (25-49%).   Other findings:   Normal pulmonary vein drainage into the left atrium.   Normal left atrial appendage without a thrombus.   Normal size of the pulmonary artery.   IMPRESSION: 1. Coronary calcium  score of 2280.   2. Normal coronary origin with right dominance.   3. Severe proximal-mid LAD stenosis (>70%).   4. Moderate proximal RCA stenosis (50-69%).   5. Mild LM and LCx stenosis (25-49%).   6. CAD-RADS 4 Severe stenosis. (70-99% or > 50% left main). Cardiac catheterization is recommended. Consider symptom-guided anti-ischemic pharmacotherapy as well as risk factor modification per guideline directed care.   7. Additional analysis with CT FFR will be submitted and reported separately.     ctFFR: 1. Left Main:  No significant stenosis.   2. LAD: significant stenosis in mid LAD.  FFRct 0.73 3. LCX:  No significant stenosis (borderline).  FFRct 0.80 4. RCA: significant stenosis in  proximal RCA.  FFRct 0.75   IMPRESSION: 1. CT FFR analysis showed significant stenosis in mid LAD and proximal RCA.   2.  Cardiac catheterization recommended. __________   Carotid artery ultrasound 06/06/2022: Summary:  Right Carotid: Velocities in the right ICA are consistent with a 40-59% stenosis. Non-hemodynamically significant plaque <50% noted in the CCA. The ECA appears >50% stenosed.   Left Carotid: Velocities in the left ICA are consistent with a 1-39%  stenosis. Non-hemodynamically significant plaque <50% noted in the  CCA. The ECA appears >50% stenosed.   Vertebrals:  Bilateral vertebral arteries demonstrate antegrade flow.  Subclavians: Bilateral subclavian arteries were stenotic.  __________   Coronary CTA with CT FFR 04/08/2020: Aorta: Normal size. Mild aortic root wall calcifications. No dissection.   Aortic Valve:  Trileaflet.  No calcifications.   Coronary Arteries:  Normal coronary origin.  Right dominance.   RCA is a dominant artery that gives rise to PDA and PLA. There is calcified plaque throughout the RCA causing mild stenosis (25-49%).   Left main is a large artery that gives rise to LAD and LCX arteries. There is mild calcification in the mid to distal LM causing minimal stenosis (<25%).   LAD is heavily calcified in the proximal to mid segments causing moderate (50%) stenosis in the proximal segment and mild stenosis (25-49%) in the mid segment.   LCX is a non-dominant artery that gives rise to one obtuse marginal branch. There is no plaque.   Other findings:   Normal pulmonary vein drainage into the left atrium.   Normal left atrial appendage without a thrombus.   Normal size of the pulmonary artery.   IMPRESSION: 1. High coronary calcium  score of 1844. This was 94th percentile for age and sex matched control. 2. Normal coronary origin with right dominance. 3. Calcified plaque causing moderate stenosis (50%) in the proximal LAD. 4.  Calcified plaque causing mild (25-49%) stenosis throughout the RCA and mid LAD. 5. CAD-RADS 3. Moderate stenosis. Consider symptom-guided anti-ischemic pharmacotherapy as well as risk factor modification per guideline directed care. 6. Additional analysis with CT FFR will be submitted and reported separately.   CT FFR: 1. Left Main:  No significant stenosis.   2. LAD: No significant stenosis. 3. LCX: No significant stenosis. 4. RCA: No significant focal stenosis. FFRct normal in the proximal RCA segment with slow taper in the mid to distal segments from 0.8 to 0.6 distally.   IMPRESSION: 1. CT FFR analysis didn't show any significant focal stenosis in the LAD or LCx. 2.  Slow taper in FFRct values in the RCA with no focal stenosis. 3.  Recommend medical management. __________   2D echo 03/09/2020: 1. Left ventricular ejection fraction, by estimation, is 60 to 65%. The  left ventricle has normal function. The left ventricle has no regional  wall motion abnormalities. Left ventricular diastolic parameters are  consistent with Grade I diastolic  dysfunction (impaired relaxation).   2. Right ventricular systolic function is normal. The right ventricular  size is normal. Tricuspid regurgitation signal is inadequate for assessing  PA pressure.   3. The mitral valve is degenerative. Trivial mitral valve regurgitation.  No evidence of mitral stenosis.   4. The aortic valve has an indeterminant number of cusps. There is mild  calcification of the aortic valve. There is moderate thickening of the  aortic valve. Aortic valve regurgitation is trivial. Mild to moderate  aortic valve  sclerosis/calcification is  present, without any evidence of aortic stenosis.   5. The inferior vena cava is dilated in size with >50% respiratory  variability, suggesting right atrial pressure of 8 mmHg.   EKG:  EKG is ordered today.  The EKG ordered today demonstrates NSR, 71 bpm, occasional PACs, rare  PVC, no acute ST-T changes  Recent Labs: 12/13/2022: ALT 17 12/16/2022: Magnesium  1.8 05/22/2023: BNP 210.8; BUN 19; Creatinine, Ser 0.94; Hemoglobin 15.3; Platelets 185; Potassium 4.9; Sodium 141  Recent Lipid Panel    Component Value Date/Time   CHOL 152 11/14/2022 1108   CHOL 182 09/08/2020 1045   TRIG 174 (H) 11/14/2022 1108   HDL 60 11/14/2022 1108   HDL 57 09/08/2020 1045   CHOLHDL 2.5 11/14/2022 1108   VLDL 35 11/14/2022 1108   LDLCALC 57 11/14/2022 1108   LDLCALC 93 09/08/2020 1045   LDLDIRECT 116.0 02/12/2020 1233    PHYSICAL EXAM:    VS:  BP (!) 150/78 (BP Location: Left Arm, Patient Position: Sitting, Cuff Size: Normal)   Pulse 71   Ht 5' 5 (1.651 m)   Wt 171 lb (77.6 kg)   SpO2 98%   BMI 28.46 kg/m   BMI: Body mass index is 28.46 kg/m.  Physical Exam Vitals reviewed.  Constitutional:      Appearance: She is well-developed.     Comments: Intermittent dry cough noted.  HENT:     Head: Normocephalic and atraumatic.  Eyes:     General:        Right eye: No discharge.        Left eye: No discharge.  Cardiovascular:     Rate and Rhythm: Normal rate and regular rhythm.     Heart sounds: Normal heart sounds, S1 normal and S2 normal. Heart sounds not distant. No midsystolic click and no opening snap. No murmur heard.    No friction rub.  Pulmonary:     Effort: Pulmonary effort is normal. No respiratory distress.     Breath sounds: Normal breath sounds. No decreased breath sounds, wheezing, rhonchi or rales.  Musculoskeletal:     Cervical back: Normal range of motion.     Right lower leg: No edema.     Left lower leg: No edema.  Skin:    General: Skin is warm and dry.     Nails: There is no clubbing.  Neurological:     Mental Status: She is alert and oriented to person, place, and time.  Psychiatric:        Speech: Speech normal.        Behavior: Behavior normal.        Thought Content: Thought content normal.        Judgment: Judgment normal.     Wt  Readings from Last 3 Encounters:  11/19/23 171 lb (77.6 kg)  11/01/23 172 lb 3.2 oz (78.1 kg)  10/10/23 178 lb 6 oz (80.9 kg)     ASSESSMENT & PLAN:   Multivessel CAD status post CABG without of angina: She is doing well without symptoms concerning for angina or cardiac decompensation.  Recent Lexiscan  MPI without evidence of ischemia.  Echo with preserved LV systolic function and no significant structural abnormalities.  Continue aggressive risk factor modification and secondary prevention including aspirin  81 mg and rosuvastatin  20 mg.  Near syncope: No further episodes.  Zio patch, echo, and Lexiscan  MPI reassuring.  Postoperative Afib: No documented evidence of recurrence.    HTN: Blood pressure is mildly elevated in  the office today, though typically well-controlled.  Start amlodipine  given discontinuation of losartan  to see if this improves her chronic cough.  Declines labs at this time.  HLD: LDL 57.  Rosuvastatin  20 mg.  Chronic cough: Euvolemic.  Trial of holding losartan .  Remains on Aciphex .  Nocturnal bradycardia: Has been referred to pulmonology for sleep study.  No longer on metoprolol .     Disposition: F/u with Dr. Mady or an APP in 1 to 2 months.   Medication Adjustments/Labs and Tests Ordered: Current medicines are reviewed at length with the patient today.  Concerns regarding medicines are outlined above. Medication changes, Labs and Tests ordered today are summarized above and listed in the Patient Instructions accessible in Encounters.   Signed, Bernardino Bring, PA-C 11/19/2023 12:35 PM     [facility] - [place] [address] [place], [place] [address] [phone]

## 2023-11-19 ENCOUNTER — Ambulatory Visit: Attending: Physician Assistant | Admitting: Physician Assistant

## 2023-11-19 ENCOUNTER — Encounter: Payer: Self-pay | Admitting: Physician Assistant

## 2023-11-19 VITALS — BP 150/78 | HR 71 | Ht 65.0 in | Wt 171.0 lb

## 2023-11-19 DIAGNOSIS — I9789 Other postprocedural complications and disorders of the circulatory system, not elsewhere classified: Secondary | ICD-10-CM | POA: Insufficient documentation

## 2023-11-19 DIAGNOSIS — R053 Chronic cough: Secondary | ICD-10-CM | POA: Diagnosis not present

## 2023-11-19 DIAGNOSIS — R001 Bradycardia, unspecified: Secondary | ICD-10-CM | POA: Diagnosis not present

## 2023-11-19 DIAGNOSIS — I2510 Atherosclerotic heart disease of native coronary artery without angina pectoris: Secondary | ICD-10-CM | POA: Diagnosis not present

## 2023-11-19 DIAGNOSIS — I10 Essential (primary) hypertension: Secondary | ICD-10-CM | POA: Insufficient documentation

## 2023-11-19 DIAGNOSIS — I4891 Unspecified atrial fibrillation: Secondary | ICD-10-CM | POA: Insufficient documentation

## 2023-11-19 DIAGNOSIS — R55 Syncope and collapse: Secondary | ICD-10-CM | POA: Diagnosis not present

## 2023-11-19 DIAGNOSIS — E785 Hyperlipidemia, unspecified: Secondary | ICD-10-CM | POA: Diagnosis not present

## 2023-11-19 DIAGNOSIS — Z951 Presence of aortocoronary bypass graft: Secondary | ICD-10-CM | POA: Insufficient documentation

## 2023-11-19 MED ORDER — AMLODIPINE BESYLATE 5 MG PO TABS: 5.0000 mg | ORAL_TABLET | Freq: Every day | ORAL | 3 refills | Status: DC

## 2023-11-19 NOTE — Patient Instructions (Signed)
 Medication Instructions:  Your physician recommends the following medication changes.  STOP TAKING: Losartan   START TAKING: Amlodipine  5 mg daily  *If you need a refill on your cardiac medications before your next appointment, please call your pharmacy*  Lab Work: None ordered at this time  If you have labs (blood work) drawn today and your tests are completely normal, you will receive your results only by: MyChart Message (if you have MyChart) OR A paper copy in the mail If you have any lab test that is abnormal or we need to change your treatment, we will call you to review the results.  Follow-Up: At [facility], you and your health needs are our priority.  As part of our continuing mission to provide you with exceptional heart care, our providers are all part of one team.  This team includes your primary Cardiologist (physician) and Advanced Practice Providers or APPs (Physician Assistants and Nurse Practitioners) who all work together to provide you with the care you need, when you need it.  Your next appointment:   1-2 month(s)  Provider:   You may see Lonni Hanson, MD or Bernardino Bring, PA-C

## 2023-12-06 NOTE — Progress Notes (Unsigned)
 Cardiology Office Note    Date:  12/07/2023   ID:  Christine [name]  Christine, [name] 18-Nov-1933, [name]  PCP:  Christine Lynwood HERO, NP  Cardiologist:  Christine Hanson, MD  Electrophysiologist:  None   Chief Complaint: Lower extremity swelling  History of Present Illness:   Christine [name]  [name] is a 89 y.o. female with history of CAD s/p four-vessel CABG in 12/2022 (LIMA to LAD, sequential SVG to OM1/OM2, and SVG to distal RCA), postoperative A-fib/flutter, HTN, HLD, rheumatic fever, asthma, carotid artery disease, hypothyroidism, and GERD who presents for evaluation of lower extremity swelling.   CAD s/p four-vessel CABG in 12/2022 (LIMA to LAD, sequential SVG to OM1/OM2, and SVG to distal RCA), postoperative A-fib/flutter, HTN, HLD, rheumatic fever, asthma, carotid artery disease, hypothyroidism, and GERD who presents for follow up of echo, Zio, and Lexiscan  MPI.    She previously established with cardiology in 02/2020 in the setting of orthopnea, lower extremity swelling, dizziness, fatigue, and orthostatic hypotension.  She reported at that time that she had previously undergone diagnostic cath in [place]  over 20 years prior and believed it had shown nonobstructive disease.  Given symptoms upon establishing with us , echo showed an EF of 60 to 65% with no regional wall motion abnormalities, grade 1 diastolic dysfunction, and mild to moderate aortic valve sclerosis without evidence of stenosis.  Coronary CTA in 03/2020 showed a calcium  score of 1844 (94th percentile) along with 50% proximal LAD stenosis and calcified stenosis in the RCA with normal FFR CT in the proximal RCA and a slow taper in the mid to distal segments from 0.8-0.6.  She was medically managed.  She underwent coronary CTA in 11/2022 that showed a calcium  score of 20-80 with greater than 70% proximal to mid LAD stenosis, 25 to 49% left main and LCx stenosis, and 50 to 69% proximal RCA stenosis.  ctFFR abnormal in the LAD and  RCA.  Subsequent LHC on 12/11/2022 showed significant multivessel CAD as outlined below.  She was admitted to [facility] and underwent four-vessel CABG in 12/13/2022.  Pre-CABG echo showed an EF of 60 to 65%, no regional wall motion abnormalities, grade 1 diastolic dysfunction, normal RV systolic function and ventricular cavity size, no significant valvular abnormalities, and an estimated right atrial pressure of 3 mmHg.  Postoperative course was complicated by PAF/flutter with spontaneous conversion to sinus rhythm prior to discharge.  She was not started on anticoagulation due to concerns about acute blood loss anemia.  She was seen in the office on 01/10/2023 noting ongoing indigestion that has been present since bypass surgery.  She also noted some improving shortness of breath with chest x-ray obtained at outside office the day prior showing persistent bilateral pleural effusions and consolidations/atelectasis.  She was prescribed furosemide , though had not started it yet.  She reported a diminished appetite.  She was seen in the office in 02/2023 with underlying fatigue improving.  Nausea resolved with discontinuation of amiodarone .     She was seen in the office on 08/20/2023 and reported a couple episodes of chest discomfort occurring 2 weeks prior with associated shortness of breath and near syncope lasting for several minutes followed by spontaneous resolution and did not feel similar to what she experienced leading up to her CABG.  Echo in 08/2023 showed an EF of 60 to 65%, no regional wall motion abnormalities, mild LVH, normal LV diastolic function parameters, normal RV systolic function and ventricular cavity size, moderately dilated left atrium, trivial mitral regurgitation, aortic valve  sclerosis without evidence of stenosis, and an estimated right atrial pressure of 3 mmHg.  Zio patch in 08/2023 showed a predominant rhythm of sinus with an average rate of 62 bpm, 12 episodes of SVT lasting up to 13  beats 1 episode of sinus bradycardia with competing idioventricular rhythm but occasional PACs and PVCs, no sustained arrhythmias or prolonged pauses, and patient triggered events corresponding to sinus rhythm, PACs, and PVCs.  She was referred to pulmonology for consideration of sleep study given bradycardic rates in the overnight hours.  Lexiscan  MPI in 11/2023 showed no significant ischemia or scar with an EF of 55 to 65% and was overall low risk.  She was last seen in the office on 11/19/2023 and was without symptoms of angina.  She reported chronic dry cough.  She had transition furosemide  from daily to every other day.  Her weight was down 2 pounds when compared to visit in 08/2023 (173 to 171 pounds).  She comes in today reporting onset of lower extremity swelling 2 days ago without progressive dyspnea, orthopnea, abdominal distention, early satiety, or symptoms of angina.  She had recently been started on amlodipine  following the discontinuation of losartan .  Upon getting home and elevating legs lower extremity swelling has improved and nearly resolved in the office today.  No changes in pharmacotherapy or dietary indiscretion.  Weight has remained stable.   Labs independently reviewed: 05/2023 - Hgb 15.3, PLT 185, BNP 210, BUN 19, serum creatinine 0.94, potassium 4.9 12/2022 - magnesium  1.8, albumin  3.1, AST/ALT normal, A1c 5.7, LP(a) 37.7 11/2022 - TC 152, TG 174, HDL 60, LDL 57 08/2021 - TSH normal   Past Medical History:  Diagnosis Date   Asthma    Carotid arterial disease    a. 06/2019 Carotid U/S: RICA 50-69%. Mod LICA plaque.   Chicken pox    Diastolic dysfunction    a. 02/2020 Echo: EF 60-65%, no rwma, GrI DD. Nl RV size/fxn. Triv MR. Mild-mod Ao sclerosis w/o stenois.   Frequent headaches    GERD (gastroesophageal reflux disease)    Heart murmur    a. 02/2020 Echo: Triv MR. Mild-mod Ao sclerosis w/o stenosis.   Hyperlipidemia    Hypertension    Hypothyroidism    Nonobstructive  CAD (coronary artery disease)    a.  Remote Cath in [facility] - reportedly nonobs dzs; b. 03/2020 Cor CTA:  Ca2+ = 1844 (94th %'ile). LAD 50p, RCA 25-49 (FFRct nl in prox RCA w/ slow taper in mid-dist segments from 0.8-->0.6).   Rheumatic fever    Urine incontinence     Past Surgical History:  Procedure Laterality Date   ABDOMINAL HYSTERECTOMY     APPENDECTOMY     BREAST BIOPSY     CORONARY ARTERY BYPASS GRAFT N/A 12/13/2022   Procedure: CORONARY ARTERY BYPASS GRAFTING (CABG) TIMES FOUR UTILIZING LEFT INTERNAL MAMMARY ARTERY AND ENDOSCOPIC VEIN HARVEST RIGHT GREATER SAPHENOUS VEIN;  Surgeon: Kerrin Elspeth BROCKS, MD;  Location: [facility] OR;  Service: Open Heart Surgery;  Laterality: N/A;   LEFT HEART CATH AND CORONARY ANGIOGRAPHY N/A 12/11/2022   Procedure: LEFT HEART CATH AND CORONARY ANGIOGRAPHY;  Surgeon: Mady Bruckner, MD;  Location: [facility] INVASIVE CV LAB;  Service: Cardiovascular;  Laterality: N/A;   MASTECTOMY Bilateral 04/1979   TEE WITHOUT CARDIOVERSION N/A 12/13/2022   Procedure: TRANSESOPHAGEAL ECHOCARDIOGRAM;  Surgeon: Kerrin Elspeth BROCKS, MD;  Location: [facility] OR;  Service: Open Heart Surgery;  Laterality: N/A;    Current Medications: Current Meds  Medication Sig   albuterol  (  VENTOLIN  HFA) 108 (90 Base) MCG/ACT inhaler Inhale 2 puffs into the lungs every 6 (six) hours as needed for wheezing or shortness of breath.   aspirin  EC 81 MG tablet Take 1 tablet (81 mg total) by mouth daily. Swallow whole.   fluticasone -salmeterol (WIXELA INHUB) 250-50 MCG/ACT AEPB Inhale 1 puff into the lungs in the morning and at bedtime.   furosemide  (LASIX ) 20 MG tablet Take 1 tablet (20 mg total) by mouth daily as needed (for shortness of breath).   hydrochlorothiazide  (MICROZIDE ) 12.5 MG capsule Take 1 capsule (12.5 mg total) by mouth daily.   levothyroxine  (SYNTHROID ) 75 MCG tablet Take 1 tablet (75 mcg total) by mouth daily.   loratadine  (CLARITIN ) 10 MG tablet Take 10 mg by mouth daily as needed for allergies.    methocarbamol  (ROBAXIN ) 500 MG tablet Take 1-2 tablets (500-1,000 mg total) by mouth 2 (two) times daily as needed for muscle spasms (sedation precautions).   RABEprazole  (ACIPHEX ) 20 MG tablet Take 1 tablet (20 mg total) by mouth daily.   rosuvastatin  (CRESTOR ) 20 MG tablet Take 1 tablet (20 mg total) by mouth daily.   [DISCONTINUED] amLODipine  (NORVASC ) 5 MG tablet Take 1 tablet (5 mg total) by mouth daily.    Allergies:   Atorvastatin  and Pollen extract   Social History   Socioeconomic History   Marital status: Widowed    Spouse name: Not on file   Number of children: 4   Years of education: college   Highest education level: Not on file  Occupational History   Not on file  Tobacco Use   Smoking status: Never   Smokeless tobacco: Never  Vaping Use   Vaping status: Never Used  Substance and Sexual Activity   Alcohol use: Yes    Comment: maybe once a year   Drug use: Never   Sexual activity: Not Currently  Other Topics Concern   Not on file  Social History Narrative   06/03/19   From: [place]  originally, moved here to be near daughter   Living: with daughter - Manuelita [employer] sales representative)   Work: retired from Chemical engineer, Engineer, site      Family: 4 children - Ronni and Hull in [place], daughter in [place] and has adopted daughter in [place]        Enjoys: play tennis, golf, walking, board games, gardening      Exercise: not currently - walking around the house   Diet: not as good as it should be, usually tries to be healthy, avoids fried foods, limits red meat      Safety   Seat belts: Yes    Guns: no   Safe in relationships: Yes    Social Drivers of Corporate investment banker Strain: Low Risk  (03/02/2023)   Overall Financial Resource Strain (CARDIA)    Difficulty of Paying Living Expenses: Not hard at all  Food Insecurity: No Food Insecurity (03/02/2023)   Hunger Vital Sign    Worried About Running Out of Food in the Last Year: Never true    Ran Out of Food  in the Last Year: Never true  Transportation Needs: No Transportation Needs (03/02/2023)   PRAPARE - Administrator, Civil Service (Medical): No    Lack of Transportation (Non-Medical): No  Physical Activity: Inactive (03/02/2023)   Exercise Vital Sign    Days of Exercise per Week: 0 days    Minutes of Exercise per Session: 0 min  Stress: No Stress Concern Present (03/02/2023)  [employer] of Occupational Health - Occupational Stress Questionnaire    Feeling of Stress : Only a little  Social Connections: Moderately Isolated (03/02/2023)   Social Connection and Isolation Panel    Frequency of Communication with Friends and Family: More than three times a week    Frequency of Social Gatherings with Friends and Family: More than three times a week    Attends Religious Services: More than 4 times per year    Active Member of [employer] or Organizations: No    Attends Banker Meetings: Never    Marital Status: Widowed     Family History:  The patient's family history includes Early death (age of onset: 75) in her mother; Heart attack (age of onset: 65) in her brother; Heart disease in her brother; Heart disease (age of onset: 70) in her brother; Kidney disease in her mother; Leukemia in her daughter; Other in her father; Valvular heart disease in her daughter.  ROS:   12-point review of systems is negative unless otherwise noted in the HPI.   EKGs/Labs/Other Studies Reviewed:    Studies reviewed were summarized above. The additional studies were reviewed today:  Lexiscan  MPI 11/13/2023:   Normal pharmacologic myocardial perfusion stress test without evidence of significant ischemia or scar.   Left ventricular systolic function is normal (LVEF 55-65%).   Post-CABG findings are seen on the attenuation correction CT.   This is a low risk study. __________   Zio patch 08/2023:   The patient was monitored for 14 days.   The predominant rhythm was sinus with an  average rate of 62 bpm (range 42-110 bpm in sinus).   There were occasional PACs and PVCs.   12 supraventricular runs occurred, lasting up to 13 beats with a maximum rate of 148 bpm.   An episode of sinus bradycardia with competing idioventricular rhythm was noted.   No sustained arrhythmia or prolonged pause was observed.   Patient triggered events correspond to sinus rhythm, PACs, and PVCs.   Predominantly sinus rhythm with occasional PACs and PVCs as well as several brief supraventricular runs.  Brief episode of idioventricular rhythm also noted. __________   2D echo 09/10/2023: 1. Left ventricular ejection fraction, by estimation, is 60 to 65%. Left  ventricular ejection fraction by 2D MOD biplane is 65.1 %. The left  ventricle has normal function. The left ventricle has no regional wall  motion abnormalities. There is mild left  ventricular hypertrophy. Left ventricular diastolic parameters were  normal.   2. Right ventricular systolic function is normal. The right ventricular  size is normal.   3. Left atrial size was moderately dilated.   4. The mitral valve is normal in structure. Trivial mitral valve  regurgitation.   5. The aortic valve is tricuspid. Aortic valve regurgitation is not  visualized. Aortic valve sclerosis is present, with no evidence of aortic  valve stenosis.   6. The inferior vena cava is normal in size with greater than 50%  respiratory variability, suggesting right atrial pressure of 3 mmHg.  __________   Intraoperative TEE 12/13/2022: POST-OP IMPRESSIONS  _ Left Ventricle: The left ventricle is unchanged from pre-bypass. There  is  normal systolic function, with an estimad ejection fraction of 60%.  _ Right Ventricle: normal function.  _ Aorta: The aorta appears unchanged from pre-bypass.  _ Left Atrial Appendage: The left atrial appendage appears unchanged from  pre-bypass.  _ Aortic Valve: The aortic valve appears unchanged from pre-bypass.  _  Mitral  Valve: There is mild regurgitation.  _ Tricuspid Valve: The tricuspid valve appears unchanged from pre-bypass.  _ Pulmonic Valve: The pulmonic valve appears unchanged from pre-bypass.  _ Comments:    Following coronary artery bypass grafting, the left ventricular systolic  function is normal. Right ventricular function is normal. There was mild  mitral  regurgitation post-bypass. The remainder of the valvular exam was  unchanged.  __________   Pre-CABG vascular ultrasound 12/12/2022: Summary:  Right Carotid: Velocities in the right ICA are consistent with a 40-59% stenosis. The ECA appears >50% stenosed.   Left Carotid: Velocities in the left ICA are consistent with a 1-39%  stenosis.  Vertebrals: Bilateral vertebral arteries demonstrate antegrade flow.  Subclavians: Right subclavian artery was stenotic. Normal flow  hemodynamics were seen in the left subclavian artery.   Right ABI: Resting right ankle-brachial index is within normal range.  Left ABI: Resting left ankle-brachial index is within normal range.  Bilateral Extremity: Doppler waveforms remain within normal limits with  compression bilaterally for the radial arteries. Doppler waveforms remain  within normal limits with compression bilaterally for the ulnar arteries.  __________   2D echo 12/12/2022: 1. Left ventricular ejection fraction, by estimation, is 60 to 65%. The  left ventricle has normal function. The left ventricle has no regional  wall motion abnormalities. Left ventricular diastolic parameters are  consistent with Grade I diastolic  dysfunction (impaired relaxation).   2. Right ventricular systolic function is normal. The right ventricular  size is normal. Tricuspid regurgitation signal is inadequate for assessing  PA pressure.   3. The mitral valve is normal in structure. No evidence of mitral valve  regurgitation. No evidence of mitral stenosis.   4. The aortic valve is tricuspid. Aortic valve  regurgitation is not  visualized. No aortic stenosis is present.   5. The inferior vena cava is normal in size with greater than 50%  respiratory variability, suggesting right atrial pressure of 3 mmHg.  __________   [facility] 12/11/2022: Conclusions: Significant multivessel coronary artery disease, including sequential 60% ostial and 50% mid LAD stenoses, complex ostial LCx stenosis involving trifurcation of OM1, OM2, and LCx proper with hazy 90% stenosis, and diffuse calcified RCA disease of up to 70%. Normal left ventricular systolic function (LVEF 55-65%) and filling pressure (LVEDP 10 mmHg).   Recommendations: Given severe multivessel CAD and continued vague chest pain at rest and with activity, we will admit Ms. Osa for medical optimization and cardiac surgery consultation for CABG. Continue aggressive secondary prevention of coronary artery disease. Obtain echocardiogram. __________   Coronary CTA 11/30/2022: FINDINGS: Aorta:  Normal size.  Aortic wall calcifications.  No dissection.   Aortic Valve:  Trileaflet.  No calcifications.   Coronary Arteries:  Normal coronary origin.  Right dominance.   RCA is a dominant artery. There is calcified plaque proximally causing moderate stenosis (50-69%).   Left main gives rise to LAD and LCX arteries. LM has calcified plaque proximally causing mild stenosis (25-49%).   LAD has heavily calcified plaque proximally causing severe stenosis (>70%).   LCX is a non-dominant artery. There is calcified plaque proximally causing mild stenosis (25-49%).   Other findings:   Normal pulmonary vein drainage into the left atrium.   Normal left atrial appendage without a thrombus.   Normal size of the pulmonary artery.   IMPRESSION: 1. Coronary calcium  score of 2280.   2. Normal coronary origin with right dominance.   3. Severe proximal-mid LAD stenosis (>70%).   4. Moderate proximal  RCA stenosis (50-69%).   5. Mild LM and LCx  stenosis (25-49%).   6. CAD-RADS 4 Severe stenosis. (70-99% or > 50% left main). Cardiac catheterization is recommended. Consider symptom-guided anti-ischemic pharmacotherapy as well as risk factor modification per guideline directed care.   7. Additional analysis with CT FFR will be submitted and reported separately.     ctFFR: 1. Left Main:  No significant stenosis.   2. LAD: significant stenosis in mid LAD.  FFRct 0.73 3. LCX: No significant stenosis (borderline).  FFRct 0.80 4. RCA: significant stenosis in proximal RCA.  FFRct 0.75   IMPRESSION: 1. CT FFR analysis showed significant stenosis in mid LAD and proximal RCA.   2.  Cardiac catheterization recommended. __________   Carotid artery ultrasound 06/06/2022: Summary:  Right Carotid: Velocities in the right ICA are consistent with a 40-59% stenosis. Non-hemodynamically significant plaque <50% noted in the CCA. The ECA appears >50% stenosed.   Left Carotid: Velocities in the left ICA are consistent with a 1-39%  stenosis. Non-hemodynamically significant plaque <50% noted in the  CCA. The ECA appears >50% stenosed.   Vertebrals:  Bilateral vertebral arteries demonstrate antegrade flow.  Subclavians: Bilateral subclavian arteries were stenotic.  __________   Coronary CTA with CT FFR 04/08/2020: Aorta: Normal size. Mild aortic root wall calcifications. No dissection.   Aortic Valve:  Trileaflet.  No calcifications.   Coronary Arteries:  Normal coronary origin.  Right dominance.   RCA is a dominant artery that gives rise to PDA and PLA. There is calcified plaque throughout the RCA causing mild stenosis (25-49%).   Left main is a large artery that gives rise to LAD and LCX arteries. There is mild calcification in the mid to distal LM causing minimal stenosis (<25%).   LAD is heavily calcified in the proximal to mid segments causing moderate (50%) stenosis in the proximal segment and mild stenosis (25-49%) in the  mid segment.   LCX is a non-dominant artery that gives rise to one obtuse marginal branch. There is no plaque.   Other findings:   Normal pulmonary vein drainage into the left atrium.   Normal left atrial appendage without a thrombus.   Normal size of the pulmonary artery.   IMPRESSION: 1. High coronary calcium  score of 1844. This was 94th percentile for age and sex matched control. 2. Normal coronary origin with right dominance. 3. Calcified plaque causing moderate stenosis (50%) in the proximal LAD. 4. Calcified plaque causing mild (25-49%) stenosis throughout the RCA and mid LAD. 5. CAD-RADS 3. Moderate stenosis. Consider symptom-guided anti-ischemic pharmacotherapy as well as risk factor modification per guideline directed care. 6. Additional analysis with CT FFR will be submitted and reported separately.   CT FFR: 1. Left Main:  No significant stenosis.   2. LAD: No significant stenosis. 3. LCX: No significant stenosis. 4. RCA: No significant focal stenosis. FFRct normal in the proximal RCA segment with slow taper in the mid to distal segments from 0.8 to 0.6 distally.   IMPRESSION: 1. CT FFR analysis didn't show any significant focal stenosis in the LAD or LCx. 2.  Slow taper in FFRct values in the RCA with no focal stenosis. 3.  Recommend medical management. __________   2D echo 03/09/2020: 1. Left ventricular ejection fraction, by estimation, is 60 to 65%. The  left ventricle has normal function. The left ventricle has no regional  wall motion abnormalities. Left ventricular diastolic parameters are  consistent with Grade I diastolic  dysfunction (impaired relaxation).  2. Right ventricular systolic function is normal. The right ventricular  size is normal. Tricuspid regurgitation signal is inadequate for assessing  PA pressure.   3. The mitral valve is degenerative. Trivial mitral valve regurgitation.  No evidence of mitral stenosis.   4. The aortic  valve has an indeterminant number of cusps. There is mild  calcification of the aortic valve. There is moderate thickening of the  aortic valve. Aortic valve regurgitation is trivial. Mild to moderate  aortic valve sclerosis/calcification is  present, without any evidence of aortic stenosis.   5. The inferior vena cava is dilated in size with >50% respiratory  variability, suggesting right atrial pressure of 8 mmHg.   EKG:  EKG is not ordered today.    Recent Labs: 12/13/2022: ALT 17 12/16/2022: Magnesium  1.8 05/22/2023: BNP 210.8; BUN 19; Creatinine, Ser 0.94; Hemoglobin 15.3; Platelets 185; Potassium 4.9; Sodium 141  Recent Lipid Panel    Component Value Date/Time   CHOL 152 11/14/2022 1108   CHOL 182 09/08/2020 1045   TRIG 174 (H) 11/14/2022 1108   HDL 60 11/14/2022 1108   HDL 57 09/08/2020 1045   CHOLHDL 2.5 11/14/2022 1108   VLDL 35 11/14/2022 1108   LDLCALC 57 11/14/2022 1108   LDLCALC 93 09/08/2020 1045   LDLDIRECT 116.0 02/12/2020 1233    PHYSICAL EXAM:    VS:  BP 126/80 (BP Location: Left Arm, Patient Position: Sitting, Cuff Size: Normal)   Pulse 68   Wt 171 lb (77.6 kg)   SpO2 98%   BMI 28.46 kg/m   BMI: Body mass index is 28.46 kg/m.  Physical Exam Vitals reviewed.  Constitutional:      Appearance: She is well-developed.  HENT:     Head: Normocephalic and atraumatic.  Eyes:     General:        Right eye: No discharge.        Left eye: No discharge.  Cardiovascular:     Rate and Rhythm: Normal rate and regular rhythm.     Heart sounds: Normal heart sounds, S1 normal and S2 normal. Heart sounds not distant. No midsystolic click and no opening snap. No murmur heard.    No friction rub.  Pulmonary:     Effort: Pulmonary effort is normal. No respiratory distress.     Breath sounds: Normal breath sounds. No decreased breath sounds, wheezing, rhonchi or rales.  Musculoskeletal:     Cervical back: Normal range of motion.     Right lower leg: No edema.      Left lower leg: No edema.  Skin:    General: Skin is warm and dry.     Nails: There is no clubbing.  Neurological:     Mental Status: She is alert and oriented to person, place, and time.  Psychiatric:        Speech: Speech normal.        Behavior: Behavior normal.        Thought Content: Thought content normal.        Judgment: Judgment normal.     Wt Readings from Last 3 Encounters:  12/07/23 171 lb (77.6 kg)  11/19/23 171 lb (77.6 kg)  11/01/23 172 lb 3.2 oz (78.1 kg)     ASSESSMENT & PLAN:   Lower extremity swelling: Resolved.  Suspect this is in the setting of recently started amlodipine .  Discontinue amlodipine  and start HCTZ 12.5 mg daily.  Check BNP and BMP today.  Recheck BMP 1 week after starting HCTZ.  Recommend leg elevation and compression socks.  Recent echo showed normal LV systolic function and diastolic function.  Multivessel CAD status post CABG without angina: She is doing well without symptoms concerning for angina or cardiac decompensation. Recent Lexiscan  MPI without evidence of ischemia. Echo with preserved LV systolic function and no significant structural abnormalities. Continue aggressive risk factor modification and secondary prevention including aspirin  81 mg and rosuvastatin  20 mg.   Near syncope: No further episodes.  Cardiac workup reassuring as outlined above.  Postoperative A-fib: No documented evidence of recurrence.  HTN: Blood pressure is well-controlled in the office today.  We are discontinuing amlodipine  as outlined above with initiation of HCTZ 12.5 mg daily.  HLD: LDL 57.  Remains on rosuvastatin  20 mg.  Nocturnal bradycardia: Has been referred to pulmonology for sleep study.  No longer on metoprolol .    Disposition: F/u with Dr. Mady or an APP as scheduled in October.   Medication Adjustments/Labs and Tests Ordered: Current medicines are reviewed at length with the patient today.  Concerns regarding medicines are outlined above.  Medication changes, Labs and Tests ordered today are summarized above and listed in the Patient Instructions accessible in Encounters.   Signed, Bernardino Bring, PA-C 12/07/2023 12:23 PM     [facility] [place] [address] [place], [place] [address] [phone]

## 2023-12-07 ENCOUNTER — Encounter: Payer: Self-pay | Admitting: Physician Assistant

## 2023-12-07 ENCOUNTER — Ambulatory Visit: Attending: Physician Assistant | Admitting: Physician Assistant

## 2023-12-07 VITALS — BP 126/80 | HR 68 | Wt 171.0 lb

## 2023-12-07 DIAGNOSIS — R001 Bradycardia, unspecified: Secondary | ICD-10-CM | POA: Diagnosis not present

## 2023-12-07 DIAGNOSIS — I2510 Atherosclerotic heart disease of native coronary artery without angina pectoris: Secondary | ICD-10-CM | POA: Insufficient documentation

## 2023-12-07 DIAGNOSIS — I9789 Other postprocedural complications and disorders of the circulatory system, not elsewhere classified: Secondary | ICD-10-CM | POA: Insufficient documentation

## 2023-12-07 DIAGNOSIS — M7989 Other specified soft tissue disorders: Secondary | ICD-10-CM | POA: Diagnosis not present

## 2023-12-07 DIAGNOSIS — I25118 Atherosclerotic heart disease of native coronary artery with other forms of angina pectoris: Secondary | ICD-10-CM | POA: Diagnosis not present

## 2023-12-07 DIAGNOSIS — I4891 Unspecified atrial fibrillation: Secondary | ICD-10-CM | POA: Insufficient documentation

## 2023-12-07 DIAGNOSIS — Z951 Presence of aortocoronary bypass graft: Secondary | ICD-10-CM | POA: Diagnosis not present

## 2023-12-07 DIAGNOSIS — Z79899 Other long term (current) drug therapy: Secondary | ICD-10-CM | POA: Insufficient documentation

## 2023-12-07 DIAGNOSIS — R55 Syncope and collapse: Secondary | ICD-10-CM | POA: Diagnosis not present

## 2023-12-07 DIAGNOSIS — I10 Essential (primary) hypertension: Secondary | ICD-10-CM | POA: Diagnosis not present

## 2023-12-07 DIAGNOSIS — E785 Hyperlipidemia, unspecified: Secondary | ICD-10-CM

## 2023-12-07 DIAGNOSIS — R0602 Shortness of breath: Secondary | ICD-10-CM | POA: Diagnosis not present

## 2023-12-07 MED ORDER — HYDROCHLOROTHIAZIDE 12.5 MG PO CAPS: 12.5000 mg | ORAL_CAPSULE | Freq: Every day | ORAL | 3 refills | Status: DC

## 2023-12-07 NOTE — Patient Instructions (Signed)
 Medication Instructions:  Your physician recommends the following medication changes.  STOP TAKING: Amlodipine    START TAKING: Hydrochlorothiazide  12.5 mg Daily  *If you need a refill on your cardiac medications before your next appointment, please call your pharmacy*  Lab Work: Your provider would like for you to have following labs drawn today BMP, BNP.    Your provider would like for you to return in 1 week to have the following labs drawn: BMP.   Please go to [facility] [address] ([facility] [address], [place] [address] You do not need an appointment.  They are open from 8 am- 4:30 pm.  Lunch from 1:00 pm- 2:00 pm You DO NOT need to be fasting.  If you have labs (blood work) drawn today and your tests are completely normal, you will receive your results only by: MyChart Message (if you have MyChart) OR A paper copy in the mail If you have any lab test that is abnormal or we need to change your treatment, we will call you to review the results.  Testing/Procedures: No test ordered today   Follow-Up: At [facility], you and your health needs are our priority.  As part of our continuing mission to provide you with exceptional heart care, our providers are all part of one team.  This team includes your primary Cardiologist (physician) and Advanced Practice Providers or APPs (Physician Assistants and Nurse Practitioners) who all work together to provide you with the care you need, when you need it.  Your next appointment:   Keep October appointment  Provider:   You may see Lonni Hanson, MD or one of the following Advanced Practice Providers on your designated Care Team:   Lonni Meager, NP Lesley Maffucci, PA-C Bernardino Bring, PA-C [place], PA-C Tylene Lunch, NP Barnie Hila, NP

## 2023-12-09 LAB — BASIC METABOLIC PANEL WITH GFR
BUN/Creatinine Ratio: 15 (ref 12–28)
BUN: 13 mg/dL (ref 8–27)
CO2: 21 mmol/L (ref 20–29)
Calcium: 10 mg/dL (ref 8.7–10.3)
Chloride: 100 mmol/L (ref 96–106)
Creatinine, Ser: 0.89 mg/dL (ref 0.57–1.00)
Glucose: 102 mg/dL — ABNORMAL HIGH (ref 70–99)
Potassium: 4.6 mmol/L (ref 3.5–5.2)
Sodium: 138 mmol/L (ref 134–144)
eGFR: 62 mL/min/1.73 (ref >59)

## 2023-12-09 LAB — BRAIN NATRIURETIC PEPTIDE: BNP: 58.8 pg/mL (ref 0.0–100.0)

## 2023-12-10 ENCOUNTER — Ambulatory Visit: Payer: Self-pay | Admitting: Physician Assistant

## 2023-12-10 DIAGNOSIS — Z79899 Other long term (current) drug therapy: Secondary | ICD-10-CM

## 2023-12-14 ENCOUNTER — Other Ambulatory Visit: Payer: Self-pay

## 2023-12-14 DIAGNOSIS — I10 Essential (primary) hypertension: Secondary | ICD-10-CM | POA: Diagnosis not present

## 2023-12-14 DIAGNOSIS — I2510 Atherosclerotic heart disease of native coronary artery without angina pectoris: Secondary | ICD-10-CM | POA: Diagnosis not present

## 2023-12-15 LAB — BASIC METABOLIC PANEL WITH GFR
BUN/Creatinine Ratio: 17 (ref 12–28)
BUN: 18 mg/dL (ref 8–27)
CO2: 21 mmol/L (ref 20–29)
Calcium: 9.8 mg/dL (ref 8.7–10.3)
Chloride: 101 mmol/L (ref 96–106)
Creatinine, Ser: 1.03 mg/dL — ABNORMAL HIGH (ref 0.57–1.00)
Glucose: 103 mg/dL — ABNORMAL HIGH (ref 70–99)
Potassium: 4.3 mmol/L (ref 3.5–5.2)
Sodium: 139 mmol/L (ref 134–144)
eGFR: 52 mL/min/1.73 — ABNORMAL LOW (ref >59)

## 2023-12-24 ENCOUNTER — Ambulatory Visit: Admitting: Physician Assistant

## 2023-12-24 NOTE — Progress Notes (Unsigned)
 Cardiology Office Note    Date:  12/25/2023   ID:  Christine Virginia  [name], Christine [name] 07-25-1933, [name]  PCP:  Wendee Lynwood HERO, NP  Cardiologist:  Lonni Hanson, MD  Electrophysiologist:  None   Chief Complaint: Follow-up  History of Present Illness:   Christine Virginia  [name] is a 89 y.o. female with history of CAD s/p four-vessel CABG in 12/2022 (LIMA to LAD, sequential SVG to OM1/OM2, and SVG to distal RCA), postoperative A-fib/flutter, HTN, HLD, rheumatic fever, asthma, carotid artery disease, hypothyroidism, and GERD who presents for follow-up of lower extremity swelling.  She previously established with cardiology in 02/2020 in the setting of orthopnea, lower extremity swelling, dizziness, fatigue, and orthostatic hypotension.  She reported at that time that she had previously undergone diagnostic cath in [place]  over 20 years prior and believed it had shown nonobstructive disease.  Given symptoms upon establishing with us , echo showed an EF of 60 to 65% with no regional wall motion abnormalities, grade 1 diastolic dysfunction, and mild to moderate aortic valve sclerosis without evidence of stenosis.  Coronary CTA in 03/2020 showed a calcium  score of 1844 (94th percentile) along with 50% proximal LAD stenosis and calcified stenosis in the RCA with normal FFR CT in the proximal RCA and a slow taper in the mid to distal segments from 0.8-0.6.  She was medically managed.  She underwent coronary CTA in 11/2022 that showed a calcium  score of 20-80 with greater than 70% proximal to mid LAD stenosis, 25 to 49% left main and LCx stenosis, and 50 to 69% proximal RCA stenosis.  ctFFR abnormal in the LAD and RCA.  Subsequent LHC on 12/11/2022 showed significant multivessel CAD as outlined below.  She was admitted to [facility] and underwent four-vessel CABG in 12/13/2022.  Pre-CABG echo showed an EF of 60 to 65%, no regional wall motion abnormalities, grade 1 diastolic dysfunction, normal RV  systolic function and ventricular cavity size, no significant valvular abnormalities, and an estimated right atrial pressure of 3 mmHg.  Postoperative course was complicated by PAF/flutter with spontaneous conversion to sinus rhythm prior to discharge.  She was not started on anticoagulation due to concerns about acute blood loss anemia.  She was seen in the office on 01/10/2023 noting ongoing indigestion that has been present since bypass surgery.  She also noted some improving shortness of breath with chest x-ray obtained at outside office the day prior showing persistent bilateral pleural effusions and consolidations/atelectasis.  She was prescribed furosemide , though had not started it yet.  She reported a diminished appetite.  She was seen in the office in 02/2023 with underlying fatigue improving.  Nausea resolved with discontinuation of amiodarone .     She was seen in the office on 08/20/2023 and reported a couple episodes of chest discomfort occurring 2 weeks prior with associated shortness of breath and near syncope lasting for several minutes followed by spontaneous resolution and did not feel similar to what she experienced leading up to her CABG.  Echo in 08/2023 showed an EF of 60 to 65%, no regional wall motion abnormalities, mild LVH, normal LV diastolic function parameters, normal RV systolic function and ventricular cavity size, moderately dilated left atrium, trivial mitral regurgitation, aortic valve sclerosis without evidence of stenosis, and an estimated right atrial pressure of 3 mmHg.  Zio patch in 08/2023 showed a predominant rhythm of sinus with an average rate of 62 bpm, 12 episodes of SVT lasting up to 13 beats 1 episode of sinus bradycardia with competing  idioventricular rhythm but occasional PACs and PVCs, no sustained arrhythmias or prolonged pauses, and patient triggered events corresponding to sinus rhythm, PACs, and PVCs.  She was referred to pulmonology for consideration of sleep  study given bradycardic rates in the overnight hours.  Lexiscan  MPI in 11/2023 showed no significant ischemia or scar with an EF of 55 to 65% and was overall low risk.   She was seen in the office on 11/19/2023 and was without symptoms of angina.  She reported chronic dry cough and was transitioned from losartan  to amlodipine .  She had transitioned furosemide  from daily to every other day.  Her weight was down 2 pounds when compared to visit in 08/2023 (173 to 171 pounds).  Following addition of amlodipine  she reported an increase in lower extremity swelling without progressive dyspnea, orthopnea, abdominal distention, early satiety, or symptoms of angina.  Swelling improved with leg elevation.  It was not suspected that amlodipine  was contributing to this swelling with recommendation to discontinue amlodipine  and start HCTZ 12.5 mg daily.  Follow-up labs showed mild renal dysfunction with a serum creatinine of 1.03 with recommendation for patient to increase water intake.  She comes in today and is without symptoms of angina or cardiac decompensation.  She does note fatigue with generalized malaise.  No progressive dyspnea, dizziness, presyncope, or syncope.  It was discovered at today's visit that she stopped losartan  after her visit 9/8, nor did she stop amlodipine  after her visit on 9/26.  She has been taking HCTZ 12.5 mg amlodipine  5 mg and losartan  50 mg.  Not checking blood pressure at home.  No significant lower extremity swelling.  She also notes insomnia.  No palpitations, falls, hematochezia, or melena.   Labs independently reviewed: 12/2023 - BUN 18, serum creatinine 1.03, potassium 4.3 11/2023 - BNP 58 05/2023 - Hgb 15.3, PLT 185, BNP 210 12/2022 - magnesium  1.8, albumin  3.1, AST/ALT normal, A1c 5.7, LP(a) 37.7 11/2022 - TC 152, TG 174, HDL 60, LDL 57 08/2021 - TSH normal   Past Medical History:  Diagnosis Date   Asthma    Carotid arterial disease    a. 06/2019 Carotid U/S: RICA 50-69%. Mod  LICA plaque.   Chicken pox    Diastolic dysfunction    a. 02/2020 Echo: EF 60-65%, no rwma, GrI DD. Nl RV size/fxn. Triv MR. Mild-mod Ao sclerosis w/o stenois.   Frequent headaches    GERD (gastroesophageal reflux disease)    Heart murmur    a. 02/2020 Echo: Triv MR. Mild-mod Ao sclerosis w/o stenosis.   Hyperlipidemia    Hypertension    Hypothyroidism    Nonobstructive CAD (coronary artery disease)    a.  Remote Cath in [facility] - reportedly nonobs dzs; b. 03/2020 Cor CTA:  Ca2+ = 1844 (94th %'ile). LAD 50p, RCA 25-49 (FFRct nl in prox RCA w/ slow taper in mid-dist segments from 0.8-->0.6).   Rheumatic fever    Urine incontinence     Past Surgical History:  Procedure Laterality Date   ABDOMINAL HYSTERECTOMY     APPENDECTOMY     BREAST BIOPSY     CORONARY ARTERY BYPASS GRAFT N/A 12/13/2022   Procedure: CORONARY ARTERY BYPASS GRAFTING (CABG) TIMES FOUR UTILIZING LEFT INTERNAL MAMMARY ARTERY AND ENDOSCOPIC VEIN HARVEST RIGHT GREATER SAPHENOUS VEIN;  Surgeon: Kerrin Elspeth BROCKS, MD;  Location: [facility] OR;  Service: Open Heart Surgery;  Laterality: N/A;   LEFT HEART CATH AND CORONARY ANGIOGRAPHY N/A 12/11/2022   Procedure: LEFT HEART CATH AND CORONARY ANGIOGRAPHY;  Surgeon: Mady Bruckner, MD;  Location: [facility] INVASIVE CV LAB;  Service: Cardiovascular;  Laterality: N/A;   MASTECTOMY Bilateral 04/1979   TEE WITHOUT CARDIOVERSION N/A 12/13/2022   Procedure: TRANSESOPHAGEAL ECHOCARDIOGRAM;  Surgeon: Kerrin Elspeth BROCKS, MD;  Location: [facility] OR;  Service: Open Heart Surgery;  Laterality: N/A;    Current Medications: Current Meds  Medication Sig   losartan  (COZAAR ) 50 MG tablet Take 1 tablet (50 mg total) by mouth daily.   [DISCONTINUED] amLODipine  (NORVASC ) 5 MG tablet Take 5 mg by mouth daily.    Allergies:   Atorvastatin  and Pollen extract   Social History   Socioeconomic History   Marital status: Widowed    Spouse name: Not on file   Number of children: 4   Years of education: college    Highest education level: Not on file  Occupational History   Not on file  Tobacco Use   Smoking status: Never   Smokeless tobacco: Never  Vaping Use   Vaping status: Never Used  Substance and Sexual Activity   Alcohol use: Yes    Comment: maybe once a year   Drug use: Never   Sexual activity: Not Currently  Other Topics Concern   Not on file  Social History Narrative   06/03/19   From: [place]  originally, moved here to be near daughter   Living: with daughter - Manuelita [employer] sales representative)   Work: retired from Chemical engineer, Engineer, site      Family: 4 children - Ronni and Lopeno in [place], daughter in [place] and has adopted daughter in [place]        Enjoys: play tennis, golf, walking, board games, gardening      Exercise: not currently - walking around the house   Diet: not as good as it should be, usually tries to be healthy, avoids fried foods, limits red meat      Safety   Seat belts: Yes    Guns: no   Safe in relationships: Yes    Social Drivers of Corporate investment banker Strain: Low Risk  (03/02/2023)   Overall Financial Resource Strain (CARDIA)    Difficulty of Paying Living Expenses: Not hard at all  Food Insecurity: No Food Insecurity (03/02/2023)   Hunger Vital Sign    Worried About Running Out of Food in the Last Year: Never true    Ran Out of Food in the Last Year: Never true  Transportation Needs: No Transportation Needs (03/02/2023)   PRAPARE - Administrator, Civil Service (Medical): No    Lack of Transportation (Non-Medical): No  Physical Activity: Inactive (03/02/2023)   Exercise Vital Sign    Days of Exercise per Week: 0 days    Minutes of Exercise per Session: 0 min  Stress: No Stress Concern Present (03/02/2023)   [employer] of Occupational Health - Occupational Stress Questionnaire    Feeling of Stress : Only a little  Social Connections: Moderately Isolated (03/02/2023)   Social Connection and Isolation Panel    Frequency of  Communication with Friends and Family: More than three times a week    Frequency of Social Gatherings with Friends and Family: More than three times a week    Attends Religious Services: More than 4 times per year    Active Member of [employer] or Organizations: No    Attends Banker Meetings: Never    Marital Status: Widowed     Family History:  The patient's family history includes Early death (age of  onset: 61) in her mother; Heart attack (age of onset: 62) in her brother; Heart disease in her brother; Heart disease (age of onset: 56) in her brother; Kidney disease in her mother; Leukemia in her daughter; Other in her father; Valvular heart disease in her daughter.  ROS:   12-point review of systems is negative unless otherwise noted in the HPI.   EKGs/Labs/Other Studies Reviewed:    Studies reviewed were summarized above. The additional studies were reviewed today:  Lexiscan  MPI 11/13/2023:   Normal pharmacologic myocardial perfusion stress test without evidence of significant ischemia or scar.   Left ventricular systolic function is normal (LVEF 55-65%).   Post-CABG findings are seen on the attenuation correction CT.   This is a low risk study. __________   Zio patch 08/2023:   The patient was monitored for 14 days.   The predominant rhythm was sinus with an average rate of 62 bpm (range 42-110 bpm in sinus).   There were occasional PACs and PVCs.   12 supraventricular runs occurred, lasting up to 13 beats with a maximum rate of 148 bpm.   An episode of sinus bradycardia with competing idioventricular rhythm was noted.   No sustained arrhythmia or prolonged pause was observed.   Patient triggered events correspond to sinus rhythm, PACs, and PVCs.   Predominantly sinus rhythm with occasional PACs and PVCs as well as several brief supraventricular runs.  Brief episode of idioventricular rhythm also noted. __________   2D echo 09/10/2023: 1. Left ventricular ejection  fraction, by estimation, is 60 to 65%. Left  ventricular ejection fraction by 2D MOD biplane is 65.1 %. The left  ventricle has normal function. The left ventricle has no regional wall  motion abnormalities. There is mild left  ventricular hypertrophy. Left ventricular diastolic parameters were  normal.   2. Right ventricular systolic function is normal. The right ventricular  size is normal.   3. Left atrial size was moderately dilated.   4. The mitral valve is normal in structure. Trivial mitral valve  regurgitation.   5. The aortic valve is tricuspid. Aortic valve regurgitation is not  visualized. Aortic valve sclerosis is present, with no evidence of aortic  valve stenosis.   6. The inferior vena cava is normal in size with greater than 50%  respiratory variability, suggesting right atrial pressure of 3 mmHg.  __________   Intraoperative TEE 12/13/2022: POST-OP IMPRESSIONS  _ Left Ventricle: The left ventricle is unchanged from pre-bypass. There  is  normal systolic function, with an estimad ejection fraction of 60%.  _ Right Ventricle: normal function.  _ Aorta: The aorta appears unchanged from pre-bypass.  _ Left Atrial Appendage: The left atrial appendage appears unchanged from  pre-bypass.  _ Aortic Valve: The aortic valve appears unchanged from pre-bypass.  _ Mitral Valve: There is mild regurgitation.  _ Tricuspid Valve: The tricuspid valve appears unchanged from pre-bypass.  _ Pulmonic Valve: The pulmonic valve appears unchanged from pre-bypass.  _ Comments:    Following coronary artery bypass grafting, the left ventricular systolic  function is normal. Right ventricular function is normal. There was mild  mitral  regurgitation post-bypass. The remainder of the valvular exam was  unchanged.  __________   Pre-CABG vascular ultrasound 12/12/2022: Summary:  Right Carotid: Velocities in the right ICA are consistent with a 40-59% stenosis. The ECA appears >50% stenosed.    Left Carotid: Velocities in the left ICA are consistent with a 1-39%  stenosis.  Vertebrals: Bilateral vertebral arteries demonstrate  antegrade flow.  Subclavians: Right subclavian artery was stenotic. Normal flow  hemodynamics were seen in the left subclavian artery.   Right ABI: Resting right ankle-brachial index is within normal range.  Left ABI: Resting left ankle-brachial index is within normal range.  Bilateral Extremity: Doppler waveforms remain within normal limits with  compression bilaterally for the radial arteries. Doppler waveforms remain  within normal limits with compression bilaterally for the ulnar arteries.  __________   2D echo 12/12/2022: 1. Left ventricular ejection fraction, by estimation, is 60 to 65%. The  left ventricle has normal function. The left ventricle has no regional  wall motion abnormalities. Left ventricular diastolic parameters are  consistent with Grade I diastolic  dysfunction (impaired relaxation).   2. Right ventricular systolic function is normal. The right ventricular  size is normal. Tricuspid regurgitation signal is inadequate for assessing  PA pressure.   3. The mitral valve is normal in structure. No evidence of mitral valve  regurgitation. No evidence of mitral stenosis.   4. The aortic valve is tricuspid. Aortic valve regurgitation is not  visualized. No aortic stenosis is present.   5. The inferior vena cava is normal in size with greater than 50%  respiratory variability, suggesting right atrial pressure of 3 mmHg.  __________   [facility] 12/11/2022: Conclusions: Significant multivessel coronary artery disease, including sequential 60% ostial and 50% mid LAD stenoses, complex ostial LCx stenosis involving trifurcation of OM1, OM2, and LCx proper with hazy 90% stenosis, and diffuse calcified RCA disease of up to 70%. Normal left ventricular systolic function (LVEF 55-65%) and filling pressure (LVEDP 10 mmHg).   Recommendations: Given  severe multivessel CAD and continued vague chest pain at rest and with activity, we will admit Ms. Osa for medical optimization and cardiac surgery consultation for CABG. Continue aggressive secondary prevention of coronary artery disease. Obtain echocardiogram. __________   Coronary CTA 11/30/2022: FINDINGS: Aorta:  Normal size.  Aortic wall calcifications.  No dissection.   Aortic Valve:  Trileaflet.  No calcifications.   Coronary Arteries:  Normal coronary origin.  Right dominance.   RCA is a dominant artery. There is calcified plaque proximally causing moderate stenosis (50-69%).   Left main gives rise to LAD and LCX arteries. LM has calcified plaque proximally causing mild stenosis (25-49%).   LAD has heavily calcified plaque proximally causing severe stenosis (>70%).   LCX is a non-dominant artery. There is calcified plaque proximally causing mild stenosis (25-49%).   Other findings:   Normal pulmonary vein drainage into the left atrium.   Normal left atrial appendage without a thrombus.   Normal size of the pulmonary artery.   IMPRESSION: 1. Coronary calcium  score of 2280.   2. Normal coronary origin with right dominance.   3. Severe proximal-mid LAD stenosis (>70%).   4. Moderate proximal RCA stenosis (50-69%).   5. Mild LM and LCx stenosis (25-49%).   6. CAD-RADS 4 Severe stenosis. (70-99% or > 50% left main). Cardiac catheterization is recommended. Consider symptom-guided anti-ischemic pharmacotherapy as well as risk factor modification per guideline directed care.   7. Additional analysis with CT FFR will be submitted and reported separately.     ctFFR: 1. Left Main:  No significant stenosis.   2. LAD: significant stenosis in mid LAD.  FFRct 0.73 3. LCX: No significant stenosis (borderline).  FFRct 0.80 4. RCA: significant stenosis in proximal RCA.  FFRct 0.75   IMPRESSION: 1. CT FFR analysis showed significant stenosis in mid LAD  and proximal RCA.  2.  Cardiac catheterization recommended. __________   Carotid artery ultrasound 06/06/2022: Summary:  Right Carotid: Velocities in the right ICA are consistent with a 40-59% stenosis. Non-hemodynamically significant plaque <50% noted in the CCA. The ECA appears >50% stenosed.   Left Carotid: Velocities in the left ICA are consistent with a 1-39%  stenosis. Non-hemodynamically significant plaque <50% noted in the  CCA. The ECA appears >50% stenosed.   Vertebrals:  Bilateral vertebral arteries demonstrate antegrade flow.  Subclavians: Bilateral subclavian arteries were stenotic.  __________   Coronary CTA with CT FFR 04/08/2020: Aorta: Normal size. Mild aortic root wall calcifications. No dissection.   Aortic Valve:  Trileaflet.  No calcifications.   Coronary Arteries:  Normal coronary origin.  Right dominance.   RCA is a dominant artery that gives rise to PDA and PLA. There is calcified plaque throughout the RCA causing mild stenosis (25-49%).   Left main is a large artery that gives rise to LAD and LCX arteries. There is mild calcification in the mid to distal LM causing minimal stenosis (<25%).   LAD is heavily calcified in the proximal to mid segments causing moderate (50%) stenosis in the proximal segment and mild stenosis (25-49%) in the mid segment.   LCX is a non-dominant artery that gives rise to one obtuse marginal branch. There is no plaque.   Other findings:   Normal pulmonary vein drainage into the left atrium.   Normal left atrial appendage without a thrombus.   Normal size of the pulmonary artery.   IMPRESSION: 1. High coronary calcium  score of 1844. This was 94th percentile for age and sex matched control. 2. Normal coronary origin with right dominance. 3. Calcified plaque causing moderate stenosis (50%) in the proximal LAD. 4. Calcified plaque causing mild (25-49%) stenosis throughout the RCA and mid LAD. 5. CAD-RADS 3. Moderate  stenosis. Consider symptom-guided anti-ischemic pharmacotherapy as well as risk factor modification per guideline directed care. 6. Additional analysis with CT FFR will be submitted and reported separately.   CT FFR: 1. Left Main:  No significant stenosis.   2. LAD: No significant stenosis. 3. LCX: No significant stenosis. 4. RCA: No significant focal stenosis. FFRct normal in the proximal RCA segment with slow taper in the mid to distal segments from 0.8 to 0.6 distally.   IMPRESSION: 1. CT FFR analysis didn't show any significant focal stenosis in the LAD or LCx. 2.  Slow taper in FFRct values in the RCA with no focal stenosis. 3.  Recommend medical management. __________   2D echo 03/09/2020: 1. Left ventricular ejection fraction, by estimation, is 60 to 65%. The  left ventricle has normal function. The left ventricle has no regional  wall motion abnormalities. Left ventricular diastolic parameters are  consistent with Grade I diastolic  dysfunction (impaired relaxation).   2. Right ventricular systolic function is normal. The right ventricular  size is normal. Tricuspid regurgitation signal is inadequate for assessing  PA pressure.   3. The mitral valve is degenerative. Trivial mitral valve regurgitation.  No evidence of mitral stenosis.   4. The aortic valve has an indeterminant number of cusps. There is mild  calcification of the aortic valve. There is moderate thickening of the  aortic valve. Aortic valve regurgitation is trivial. Mild to moderate  aortic valve sclerosis/calcification is  present, without any evidence of aortic stenosis.   5. The inferior vena cava is dilated in size with >50% respiratory  variability, suggesting right atrial pressure of 8 mmHg.   EKG:  EKG is ordered today.  The EKG ordered today demonstrates NSR, 73 bpm, rare PVC, no acute ST-T changes  Recent Labs: 05/22/2023: Hemoglobin 15.3; Platelets 185 12/07/2023: BNP 58.8 12/14/2023: BUN  18; Creatinine, Ser 1.03; Potassium 4.3; Sodium 139  Recent Lipid Panel    Component Value Date/Time   CHOL 152 11/14/2022 1108   CHOL 182 09/08/2020 1045   TRIG 174 (H) 11/14/2022 1108   HDL 60 11/14/2022 1108   HDL 57 09/08/2020 1045   CHOLHDL 2.5 11/14/2022 1108   VLDL 35 11/14/2022 1108   LDLCALC 57 11/14/2022 1108   LDLCALC 93 09/08/2020 1045   LDLDIRECT 116.0 02/12/2020 1233    PHYSICAL EXAM:    VS:  BP (!) 98/50 (BP Location: Left Arm, Patient Position: Sitting, Cuff Size: Normal)   Pulse 73   Ht 5' 5 (1.651 m)   Wt 180 lb 6 oz (81.8 kg)   SpO2 98%   BMI 30.02 kg/m   BMI: Body mass index is 30.02 kg/m.  Physical Exam Vitals reviewed.  Constitutional:      Appearance: She is well-developed.  HENT:     Head: Normocephalic and atraumatic.  Eyes:     General:        Right eye: No discharge.        Left eye: No discharge.  Cardiovascular:     Rate and Rhythm: Normal rate and regular rhythm.     Heart sounds: Normal heart sounds, S1 normal and S2 normal. Heart sounds not distant. No midsystolic click and no opening snap. No murmur heard.    No friction rub.  Pulmonary:     Effort: Pulmonary effort is normal. No respiratory distress.     Breath sounds: Normal breath sounds. No decreased breath sounds, wheezing, rhonchi or rales.  Musculoskeletal:     Cervical back: Normal range of motion.     Right lower leg: No edema.     Left lower leg: No edema.  Skin:    General: Skin is warm and dry.     Nails: There is no clubbing.  Neurological:     Mental Status: She is alert and oriented to person, place, and time.  Psychiatric:        Speech: Speech normal.        Behavior: Behavior normal.        Thought Content: Thought content normal.        Judgment: Judgment normal.     Wt Readings from Last 3 Encounters:  12/25/23 180 lb 6 oz (81.8 kg)  12/07/23 171 lb (77.6 kg)  11/19/23 171 lb (77.6 kg)     ASSESSMENT & PLAN:   HTN: Blood pressure is soft in the  office today which is likely contributing to her generalized malaise and fatigue.  Suspect this is in the setting of polypharmacy.  As outlined above, she did not stop losartan  when she was transition from ARB to amlodipine  on 9/8.  She did not stop amlodipine  when she was transition from calcium  channel blocker to HCTZ on 9/26.  Recommend she discontinue HCTZ and amlodipine .  For now, continue losartan  50 mg daily.  Check BMP.  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  Recent Lexiscan  MPI without evidence of ischemia.  Echo with preserved LV systolic function with no significant structural abnormalities.  Continue aggressive risk factor modification and secondary prevention including aspirin  81 mg and rosuvastatin  20 mg.  Check CBC.  Near syncope: No further episodes.  Cardiac workup reassuring.  Postoperative A-fib: No documented evidence of recurrence.  HLD: LDL 57.  Remains on rosuvastatin  20 mg.  Nocturnal bradycardia: Has been referred to pulmonology for sleep study.  No longer on metoprolol .    Disposition: F/u with Dr. Mady or an APP in 1 month.   Medication Adjustments/Labs and Tests Ordered: Current medicines are reviewed at length with the patient today.  Concerns regarding medicines are outlined above. Medication changes, Labs and Tests ordered today are summarized above and listed in the Patient Instructions accessible in Encounters.   Signed, Bernardino Bring, PA-C 12/25/2023 5:03 PM     [facility] -  [address] [place], [place] [address] [phone]

## 2023-12-25 ENCOUNTER — Encounter: Payer: Self-pay | Admitting: Physician Assistant

## 2023-12-25 ENCOUNTER — Ambulatory Visit: Attending: Physician Assistant | Admitting: Physician Assistant

## 2023-12-25 VITALS — BP 98/50 | HR 73 | Ht 65.0 in | Wt 180.4 lb

## 2023-12-25 DIAGNOSIS — R55 Syncope and collapse: Secondary | ICD-10-CM | POA: Diagnosis not present

## 2023-12-25 DIAGNOSIS — Z79899 Other long term (current) drug therapy: Secondary | ICD-10-CM | POA: Insufficient documentation

## 2023-12-25 DIAGNOSIS — I4891 Unspecified atrial fibrillation: Secondary | ICD-10-CM | POA: Diagnosis present

## 2023-12-25 DIAGNOSIS — Z951 Presence of aortocoronary bypass graft: Secondary | ICD-10-CM | POA: Diagnosis present

## 2023-12-25 DIAGNOSIS — I10 Essential (primary) hypertension: Secondary | ICD-10-CM | POA: Diagnosis present

## 2023-12-25 DIAGNOSIS — I9789 Other postprocedural complications and disorders of the circulatory system, not elsewhere classified: Secondary | ICD-10-CM | POA: Diagnosis present

## 2023-12-25 DIAGNOSIS — E785 Hyperlipidemia, unspecified: Secondary | ICD-10-CM | POA: Insufficient documentation

## 2023-12-25 DIAGNOSIS — I2510 Atherosclerotic heart disease of native coronary artery without angina pectoris: Secondary | ICD-10-CM | POA: Insufficient documentation

## 2023-12-25 DIAGNOSIS — R001 Bradycardia, unspecified: Secondary | ICD-10-CM | POA: Diagnosis not present

## 2023-12-25 MED ORDER — LOSARTAN POTASSIUM 50 MG PO TABS
50.0000 mg | ORAL_TABLET | Freq: Every day | ORAL | 3 refills | Status: AC
Start: 2023-12-25 — End: 2024-03-24

## 2023-12-25 NOTE — Patient Instructions (Signed)
 Medication Instructions:  Your physician recommends the following medication changes.  STOP TAKING: Amlodipine  Hydrochlorothiazide   START TAKING: Losartan  50 mg daily  *If you need a refill on your cardiac medications before your next appointment, please call your pharmacy*  Lab Work: Your provider would like for you to have following labs drawn today CBC and BMeT.   If you have labs (blood work) drawn today and your tests are completely normal, you will receive your results only by: MyChart Message (if you have MyChart) OR A paper copy in the mail If you have any lab test that is abnormal or we need to change your treatment, we will call you to review the results.  Follow-Up: At [facility], you and your health needs are our priority.  As part of our continuing mission to provide you with exceptional heart care, our providers are all part of one team.  This team includes your primary Cardiologist (physician) and Advanced Practice Providers or APPs (Physician Assistants and Nurse Practitioners) who all work together to provide you with the care you need, when you need it.  Your next appointment:   1 month(s)  Provider:   You may see Lonni Hanson, MD or Bernardino Bring, PA-C

## 2023-12-26 LAB — BASIC METABOLIC PANEL WITH GFR
BUN/Creatinine Ratio: 17 (ref 12–28)
BUN: 20 mg/dL (ref 8–27)
CO2: 23 mmol/L (ref 20–29)
Calcium: 9.5 mg/dL (ref 8.7–10.3)
Chloride: 99 mmol/L (ref 96–106)
Creatinine, Ser: 1.2 mg/dL — ABNORMAL HIGH (ref 0.57–1.00)
Glucose: 81 mg/dL (ref 70–99)
Potassium: 4.4 mmol/L (ref 3.5–5.2)
Sodium: 138 mmol/L (ref 134–144)
eGFR: 44 mL/min/1.73 — ABNORMAL LOW (ref >59)

## 2023-12-26 LAB — CBC
Hematocrit: 44.6 % (ref 34.0–46.6)
Hemoglobin: 14.4 g/dL (ref 11.1–15.9)
MCH: 28.9 pg (ref 26.6–33.0)
MCHC: 32.3 g/dL (ref 31.5–35.7)
MCV: 90 fL (ref 79–97)
Platelets: 173 x10E3/uL (ref 150–450)
RBC: 4.98 x10E6/uL (ref 3.77–5.28)
RDW: 12.4 % (ref 11.7–15.4)
WBC: 7.7 x10E3/uL (ref 3.4–10.8)

## 2023-12-27 ENCOUNTER — Ambulatory Visit: Payer: Self-pay | Admitting: Physician Assistant

## 2023-12-27 DIAGNOSIS — Z79899 Other long term (current) drug therapy: Secondary | ICD-10-CM

## 2024-01-03 DIAGNOSIS — Z79899 Other long term (current) drug therapy: Secondary | ICD-10-CM | POA: Diagnosis not present

## 2024-01-04 ENCOUNTER — Other Ambulatory Visit: Payer: Self-pay | Admitting: Emergency Medicine

## 2024-01-04 DIAGNOSIS — Z79899 Other long term (current) drug therapy: Secondary | ICD-10-CM

## 2024-01-04 LAB — BASIC METABOLIC PANEL WITH GFR
BUN/Creatinine Ratio: 19 (ref 12–28)
BUN: 26 mg/dL (ref 8–27)
CO2: 22 mmol/L (ref 20–29)
Calcium: 9.6 mg/dL (ref 8.7–10.3)
Chloride: 99 mmol/L (ref 96–106)
Creatinine, Ser: 1.34 mg/dL — ABNORMAL HIGH (ref 0.57–1.00)
Glucose: 91 mg/dL (ref 70–99)
Potassium: 4.7 mmol/L (ref 3.5–5.2)
Sodium: 137 mmol/L (ref 134–144)
eGFR: 38 mL/min/1.73 — ABNORMAL LOW (ref >59)

## 2024-01-09 ENCOUNTER — Other Ambulatory Visit: Payer: Self-pay | Admitting: Emergency Medicine

## 2024-01-09 DIAGNOSIS — Z79899 Other long term (current) drug therapy: Secondary | ICD-10-CM

## 2024-01-10 ENCOUNTER — Ambulatory Visit: Payer: Self-pay | Admitting: Physician Assistant

## 2024-01-10 LAB — BASIC METABOLIC PANEL WITH GFR
BUN/Creatinine Ratio: 18 (ref 12–28)
BUN: 18 mg/dL (ref 8–27)
CO2: 21 mmol/L (ref 20–29)
Calcium: 9.9 mg/dL (ref 8.7–10.3)
Chloride: 101 mmol/L (ref 96–106)
Creatinine, Ser: 1.02 mg/dL — ABNORMAL HIGH (ref 0.57–1.00)
Glucose: 100 mg/dL — ABNORMAL HIGH (ref 70–99)
Potassium: 4.8 mmol/L (ref 3.5–5.2)
Sodium: 137 mmol/L (ref 134–144)
eGFR: 53 mL/min/1.73 — ABNORMAL LOW (ref >59)

## 2024-01-26 NOTE — Progress Notes (Unsigned)
 Cardiology Office Note    Date:  01/28/2024   ID:  Christine [name], Christine [name] 1933-04-22, [name]  PCP:  Wendee Lynwood HERO, NP  Cardiologist:  Lonni Hanson, MD  Electrophysiologist:  None   Chief Complaint: Follow-up  History of Present Illness:   Christine [name] is a 89 y.o. female with history of CAD s/p four-vessel CABG in 12/2022 (LIMA to LAD, sequential SVG to OM1/OM2, and SVG to distal RCA), postoperative A-fib/flutter, HTN, HLD, rheumatic fever, asthma, carotid artery disease, hypothyroidism, and GERD who presents for follow-up of HTN.  She previously established with cardiology in 02/2020 in the setting of orthopnea, lower extremity swelling, dizziness, fatigue, and orthostatic hypotension.  She reported at that time that she had previously undergone diagnostic cath in [place]  over 20 years prior and believed it had shown nonobstructive disease.  Given symptoms upon establishing with us , echo showed an EF of 60 to 65% with no regional wall motion abnormalities, grade 1 diastolic dysfunction, and mild to moderate aortic valve sclerosis without evidence of stenosis.  Coronary CTA in 03/2020 showed a calcium  score of 1844 (94th percentile) along with 50% proximal LAD stenosis and calcified stenosis in the RCA with normal FFR CT in the proximal RCA and a slow taper in the mid to distal segments from 0.8-0.6.  She was medically managed.  She underwent coronary CTA in 11/2022 that showed a calcium  score of 20-80 with greater than 70% proximal to mid LAD stenosis, 25 to 49% left main and LCx stenosis, and 50 to 69% proximal RCA stenosis.  ctFFR abnormal in the LAD and RCA.  Subsequent LHC on 12/11/2022 showed significant multivessel CAD as outlined below.  She was admitted to [facility] and underwent four-vessel CABG in 12/13/2022.  Pre-CABG echo showed an EF of 60 to 65%, no regional wall motion abnormalities, grade 1 diastolic dysfunction, normal RV systolic function and  ventricular cavity size, no significant valvular abnormalities, and an estimated right atrial pressure of 3 mmHg.  Postoperative course was complicated by PAF/flutter with spontaneous conversion to sinus rhythm prior to discharge.  She was not started on anticoagulation due to concerns about acute blood loss anemia.  She was seen in the office on 01/10/2023 noting ongoing indigestion that has been present since bypass surgery.  She also noted some improving shortness of breath with chest x-ray obtained at outside office the day prior showing persistent bilateral pleural effusions and consolidations/atelectasis.  She was prescribed furosemide , though had not started it yet.  She reported a diminished appetite.  She was seen in the office in 02/2023 with underlying fatigue improving.  Nausea resolved with discontinuation of amiodarone .     She was seen in the office on 08/20/2023 and reported a couple episodes of chest discomfort occurring 2 weeks prior with associated shortness of breath and near syncope lasting for several minutes followed by spontaneous resolution and did not feel similar to what she experienced leading up to her CABG.  Echo in 08/2023 showed an EF of 60 to 65%, no regional wall motion abnormalities, mild LVH, normal LV diastolic function parameters, normal RV systolic function and ventricular cavity size, moderately dilated left atrium, trivial mitral regurgitation, aortic valve sclerosis without evidence of stenosis, and an estimated right atrial pressure of 3 mmHg.  Zio patch in 08/2023 showed a predominant rhythm of sinus with an average rate of 62 bpm, 12 episodes of SVT lasting up to 13 beats 1 episode of sinus bradycardia with competing idioventricular rhythm  but occasional PACs and PVCs, no sustained arrhythmias or prolonged pauses, and patient triggered events corresponding to sinus rhythm, PACs, and PVCs.  She was referred to pulmonology for consideration of sleep study given bradycardic  rates in the overnight hours.  Lexiscan  MPI in 11/2023 showed no significant ischemia or scar with an EF of 55 to 65% and was overall low risk.   She was seen in the office on 11/19/2023 and reported chronic dry cough leading to transition from losartan  to amlodipine .  She had transitioned furosemide  from daily to every other day.  Her weight was down 2 pounds when compared to visit in 08/2023 (173 to 171 pounds).  Following addition of amlodipine  she reported an increase in lower extremity swelling without progressive dyspnea, orthopnea, abdominal distention, early satiety, or symptoms of angina.  Swelling improved with leg elevation.  It was suspected that amlodipine  was contributing to this swelling with recommendation to discontinue amlodipine  and start HCTZ 12.5 mg daily.  Follow-up labs showed mild renal dysfunction with a serum creatinine of 1.03 with recommendation for patient to increase water intake.  She was last seen in the office on 12/25/2023 noting fatigue and generalized malaise.  It was determined at that visit that she did not stop losartan  after her visit on 9/8, nor did she stop amlodipine  after her visit on 9/26.  She was taking HCTZ 12.5 mg, amlodipine  5 mg, and losartan  50 mg daily.  Blood pressure was soft at 98/50.  It was recommended she discontinue HCTZ and amlodipine  with continuation of losartan  50 mg at that time.  She comes in doing well from a cardiac perspective and is without symptoms of angina or cardiac decompensation.  No dizziness, presyncope, or syncope.  Largely sedentary at baseline.  No lower extremity swelling or progressive orthopnea.  Weight stable.  Has only taken as needed furosemide  one time since her last visit.  Not checking blood pressures at home.  Generalized malaise and fatigue have improved following discontinuation of HCTZ and amlodipine .  No falls or symptoms concerning for bleeding.   Labs independently reviewed: 12/2023 - BUN 18, serum creatinine 1.02,  potassium 4.8 11/2023 - BNP 58 05/2023 - Hgb 15.3, PLT 185, BNP 210 12/2022 - magnesium  1.8, albumin  3.1, AST/ALT normal, A1c 5.7, LP(a) 37.7 11/2022 - TC 152, TG 174, HDL 60, LDL 57 08/2021 - TSH normal   Past Medical History:  Diagnosis Date   Asthma    Carotid arterial disease    a. 06/2019 Carotid U/S: RICA 50-69%. Mod LICA plaque.   Chicken pox    Diastolic dysfunction    a. 02/2020 Echo: EF 60-65%, no rwma, GrI DD. Nl RV size/fxn. Triv MR. Mild-mod Ao sclerosis w/o stenois.   Frequent headaches    GERD (gastroesophageal reflux disease)    Heart murmur    a. 02/2020 Echo: Triv MR. Mild-mod Ao sclerosis w/o stenosis.   Hyperlipidemia    Hypertension    Hypothyroidism    Nonobstructive CAD (coronary artery disease)    a.  Remote Cath in [facility] - reportedly nonobs dzs; b. 03/2020 Cor CTA:  Ca2+ = 1844 (94th %'ile). LAD 50p, RCA 25-49 (FFRct nl in prox RCA w/ slow taper in mid-dist segments from 0.8-->0.6).   Rheumatic fever    Urine incontinence     Past Surgical History:  Procedure Laterality Date   ABDOMINAL HYSTERECTOMY     APPENDECTOMY     BREAST BIOPSY     CORONARY ARTERY BYPASS GRAFT N/A 12/13/2022  Procedure: CORONARY ARTERY BYPASS GRAFTING (CABG) TIMES FOUR UTILIZING LEFT INTERNAL MAMMARY ARTERY AND ENDOSCOPIC VEIN HARVEST RIGHT GREATER SAPHENOUS VEIN;  Surgeon: Kerrin Elspeth BROCKS, MD;  Location: [facility] OR;  Service: Open Heart Surgery;  Laterality: N/A;   LEFT HEART CATH AND CORONARY ANGIOGRAPHY N/A 12/11/2022   Procedure: LEFT HEART CATH AND CORONARY ANGIOGRAPHY;  Surgeon: Mady Bruckner, MD;  Location: [facility] INVASIVE CV LAB;  Service: Cardiovascular;  Laterality: N/A;   MASTECTOMY Bilateral 04/1979   TEE WITHOUT CARDIOVERSION N/A 12/13/2022   Procedure: TRANSESOPHAGEAL ECHOCARDIOGRAM;  Surgeon: Kerrin Elspeth BROCKS, MD;  Location: [facility] OR;  Service: Open Heart Surgery;  Laterality: N/A;    Current Medications: Current Meds  Medication Sig   albuterol  (VENTOLIN  HFA) 108 (90  Base) MCG/ACT inhaler Inhale 2 puffs into the lungs every 6 (six) hours as needed for wheezing or shortness of breath.   aspirin  EC 81 MG tablet Take 1 tablet (81 mg total) by mouth daily. Swallow whole.   fluticasone -salmeterol (WIXELA INHUB) 250-50 MCG/ACT AEPB Inhale 1 puff into the lungs in the morning and at bedtime.   furosemide  (LASIX ) 20 MG tablet Take 1 tablet (20 mg total) by mouth daily as needed (for shortness of breath).   levothyroxine  (SYNTHROID ) 75 MCG tablet Take 1 tablet (75 mcg total) by mouth daily.   loratadine  (CLARITIN ) 10 MG tablet Take 10 mg by mouth daily as needed for allergies.   losartan  (COZAAR ) 50 MG tablet Take 1 tablet (50 mg total) by mouth daily.   RABEprazole  (ACIPHEX ) 20 MG tablet Take 1 tablet (20 mg total) by mouth daily.   rosuvastatin  (CRESTOR ) 20 MG tablet Take 1 tablet (20 mg total) by mouth daily.    Allergies:   Atorvastatin  and Pollen extract   Social History   Socioeconomic History   Marital status: Widowed    Spouse name: Not on file   Number of children: 4   Years of education: college   Highest education level: Not on file  Occupational History   Not on file  Tobacco Use   Smoking status: Never   Smokeless tobacco: Never  Vaping Use   Vaping status: Never Used  Substance and Sexual Activity   Alcohol use: Yes    Comment: maybe once a year   Drug use: Never   Sexual activity: Not Currently  Other Topics Concern   Not on file  Social History Narrative   06/03/19   From: [place]  originally, moved here to be near daughter   Living: with daughter - Manuelita Medical Sales Representative)   Work: retired from chemical engineer, engineer, site      Family: 4 children - Ronni and [place] in [place], daughter in [place] and has adopted daughter in [place]        Enjoys: play tennis, golf, walking, board games, gardening      Exercise: not currently - walking around the house   Diet: not as good as it should be, usually tries to be healthy, avoids fried  foods, limits red meat      Safety   Seat belts: Yes    Guns: no   Safe in relationships: Yes    Social Drivers of Corporate Investment Banker Strain: Low Risk  (03/02/2023)   Overall Financial Resource Strain (CARDIA)    Difficulty of Paying Living Expenses: Not hard at all  Food Insecurity: No Food Insecurity (03/02/2023)   Hunger Vital Sign    Worried About Running Out of Food in the Last Year: Never true  Ran Out of Food in the Last Year: Never true  Transportation Needs: No Transportation Needs (03/02/2023)   PRAPARE - Administrator, Civil Service (Medical): No    Lack of Transportation (Non-Medical): No  Physical Activity: Inactive (03/02/2023)   Exercise Vital Sign    Days of Exercise per Week: 0 days    Minutes of Exercise per Session: 0 min  Stress: No Stress Concern Present (03/02/2023)   [employer] of Occupational Health - Occupational Stress Questionnaire    Feeling of Stress : Only a little  Social Connections: Moderately Isolated (03/02/2023)   Social Connection and Isolation Panel    Frequency of Communication with Friends and Family: More than three times a week    Frequency of Social Gatherings with Friends and Family: More than three times a week    Attends Religious Services: More than 4 times per year    Active Member of [employer] or Organizations: No    Attends Banker Meetings: Never    Marital Status: Widowed     Family History:  The patient's family history includes Early death (age of onset: 72) in her mother; Heart attack (age of onset: 35) in her brother; Heart disease in her brother; Heart disease (age of onset: 60) in her brother; Kidney disease in her mother; Leukemia in her daughter; Other in her father; Valvular heart disease in her daughter.  ROS:   12-point review of systems is negative unless otherwise noted in the HPI.   EKGs/Labs/Other Studies Reviewed:    Studies reviewed were summarized above. The  additional studies were reviewed today:  Lexiscan  MPI 11/13/2023:   Normal pharmacologic myocardial perfusion stress test without evidence of significant ischemia or scar.   Left ventricular systolic function is normal (LVEF 55-65%).   Post-CABG findings are seen on the attenuation correction CT.   This is a low risk study. __________   Zio patch 08/2023:   The patient was monitored for 14 days.   The predominant rhythm was sinus with an average rate of 62 bpm (range 42-110 bpm in sinus).   There were occasional PACs and PVCs.   12 supraventricular runs occurred, lasting up to 13 beats with a maximum rate of 148 bpm.   An episode of sinus bradycardia with competing idioventricular rhythm was noted.   No sustained arrhythmia or prolonged pause was observed.   Patient triggered events correspond to sinus rhythm, PACs, and PVCs.   Predominantly sinus rhythm with occasional PACs and PVCs as well as several brief supraventricular runs.  Brief episode of idioventricular rhythm also noted. __________   2D echo 09/10/2023: 1. Left ventricular ejection fraction, by estimation, is 60 to 65%. Left  ventricular ejection fraction by 2D MOD biplane is 65.1 %. The left  ventricle has normal function. The left ventricle has no regional wall  motion abnormalities. There is mild left  ventricular hypertrophy. Left ventricular diastolic parameters were  normal.   2. Right ventricular systolic function is normal. The right ventricular  size is normal.   3. Left atrial size was moderately dilated.   4. The mitral valve is normal in structure. Trivial mitral valve  regurgitation.   5. The aortic valve is tricuspid. Aortic valve regurgitation is not  visualized. Aortic valve sclerosis is present, with no evidence of aortic  valve stenosis.   6. The inferior vena cava is normal in size with greater than 50%  respiratory variability, suggesting right atrial pressure of 3 mmHg.  __________   Intraoperative  TEE 12/13/2022: POST-OP IMPRESSIONS  _ Left Ventricle: The left ventricle is unchanged from pre-bypass. There  is  normal systolic function, with an estimad ejection fraction of 60%.  _ Right Ventricle: normal function.  _ Aorta: The aorta appears unchanged from pre-bypass.  _ Left Atrial Appendage: The left atrial appendage appears unchanged from  pre-bypass.  _ Aortic Valve: The aortic valve appears unchanged from pre-bypass.  _ Mitral Valve: There is mild regurgitation.  _ Tricuspid Valve: The tricuspid valve appears unchanged from pre-bypass.  _ Pulmonic Valve: The pulmonic valve appears unchanged from pre-bypass.  _ Comments:    Following coronary artery bypass grafting, the left ventricular systolic  function is normal. Right ventricular function is normal. There was mild  mitral  regurgitation post-bypass. The remainder of the valvular exam was  unchanged.  __________   Pre-CABG vascular ultrasound 12/12/2022: Summary:  Right Carotid: Velocities in the right ICA are consistent with a 40-59% stenosis. The ECA appears >50% stenosed.   Left Carotid: Velocities in the left ICA are consistent with a 1-39%  stenosis.  Vertebrals: Bilateral vertebral arteries demonstrate antegrade flow.  Subclavians: Right subclavian artery was stenotic. Normal flow  hemodynamics were seen in the left subclavian artery.   Right ABI: Resting right ankle-brachial index is within normal range.  Left ABI: Resting left ankle-brachial index is within normal range.  Bilateral Extremity: Doppler waveforms remain within normal limits with  compression bilaterally for the radial arteries. Doppler waveforms remain  within normal limits with compression bilaterally for the ulnar arteries.  __________   2D echo 12/12/2022: 1. Left ventricular ejection fraction, by estimation, is 60 to 65%. The  left ventricle has normal function. The left ventricle has no regional  wall motion abnormalities. Left  ventricular diastolic parameters are  consistent with Grade I diastolic  dysfunction (impaired relaxation).   2. Right ventricular systolic function is normal. The right ventricular  size is normal. Tricuspid regurgitation signal is inadequate for assessing  PA pressure.   3. The mitral valve is normal in structure. No evidence of mitral valve  regurgitation. No evidence of mitral stenosis.   4. The aortic valve is tricuspid. Aortic valve regurgitation is not  visualized. No aortic stenosis is present.   5. The inferior vena cava is normal in size with greater than 50%  respiratory variability, suggesting right atrial pressure of 3 mmHg.  __________   [facility] 12/11/2022: Conclusions: Significant multivessel coronary artery disease, including sequential 60% ostial and 50% mid LAD stenoses, complex ostial LCx stenosis involving trifurcation of OM1, OM2, and LCx proper with hazy 90% stenosis, and diffuse calcified RCA disease of up to 70%. Normal left ventricular systolic function (LVEF 55-65%) and filling pressure (LVEDP 10 mmHg).   Recommendations: Given severe multivessel CAD and continued vague chest pain at rest and with activity, we will admit Christine [name] for medical optimization and cardiac surgery consultation for CABG. Continue aggressive secondary prevention of coronary artery disease. Obtain echocardiogram. __________   Coronary CTA 11/30/2022: FINDINGS: Aorta:  Normal size.  Aortic wall calcifications.  No dissection.   Aortic Valve:  Trileaflet.  No calcifications.   Coronary Arteries:  Normal coronary origin.  Right dominance.   RCA is a dominant artery. There is calcified plaque proximally causing moderate stenosis (50-69%).   Left main gives rise to LAD and LCX arteries. LM has calcified plaque proximally causing mild stenosis (25-49%).   LAD has heavily calcified plaque proximally causing severe stenosis (>70%).  LCX is a non-dominant artery. There is calcified  plaque proximally causing mild stenosis (25-49%).   Other findings:   Normal pulmonary vein drainage into the left atrium.   Normal left atrial appendage without a thrombus.   Normal size of the pulmonary artery.   IMPRESSION: 1. Coronary calcium  score of 2280.   2. Normal coronary origin with right dominance.   3. Severe proximal-mid LAD stenosis (>70%).   4. Moderate proximal RCA stenosis (50-69%).   5. Mild LM and LCx stenosis (25-49%).   6. CAD-RADS 4 Severe stenosis. (70-99% or > 50% left main). Cardiac catheterization is recommended. Consider symptom-guided anti-ischemic pharmacotherapy as well as risk factor modification per guideline directed care.   7. Additional analysis with CT FFR will be submitted and reported separately.     ctFFR: 1. Left Main:  No significant stenosis.   2. LAD: significant stenosis in mid LAD.  FFRct 0.73 3. LCX: No significant stenosis (borderline).  FFRct 0.80 4. RCA: significant stenosis in proximal RCA.  FFRct 0.75   IMPRESSION: 1. CT FFR analysis showed significant stenosis in mid LAD and proximal RCA.   2.  Cardiac catheterization recommended. __________   Carotid artery ultrasound 06/06/2022: Summary:  Right Carotid: Velocities in the right ICA are consistent with a 40-59% stenosis. Non-hemodynamically significant plaque <50% noted in the CCA. The ECA appears >50% stenosed.   Left Carotid: Velocities in the left ICA are consistent with a 1-39%  stenosis. Non-hemodynamically significant plaque <50% noted in the  CCA. The ECA appears >50% stenosed.   Vertebrals:  Bilateral vertebral arteries demonstrate antegrade flow.  Subclavians: Bilateral subclavian arteries were stenotic.  __________   Coronary CTA with CT FFR 04/08/2020: Aorta: Normal size. Mild aortic root wall calcifications. No dissection.   Aortic Valve:  Trileaflet.  No calcifications.   Coronary Arteries:  Normal coronary origin.  Right dominance.   RCA  is a dominant artery that gives rise to PDA and PLA. There is calcified plaque throughout the RCA causing mild stenosis (25-49%).   Left main is a large artery that gives rise to LAD and LCX arteries. There is mild calcification in the mid to distal LM causing minimal stenosis (<25%).   LAD is heavily calcified in the proximal to mid segments causing moderate (50%) stenosis in the proximal segment and mild stenosis (25-49%) in the mid segment.   LCX is a non-dominant artery that gives rise to one obtuse marginal branch. There is no plaque.   Other findings:   Normal pulmonary vein drainage into the left atrium.   Normal left atrial appendage without a thrombus.   Normal size of the pulmonary artery.   IMPRESSION: 1. High coronary calcium  score of 1844. This was 94th percentile for age and sex matched control. 2. Normal coronary origin with right dominance. 3. Calcified plaque causing moderate stenosis (50%) in the proximal LAD. 4. Calcified plaque causing mild (25-49%) stenosis throughout the RCA and mid LAD. 5. CAD-RADS 3. Moderate stenosis. Consider symptom-guided anti-ischemic pharmacotherapy as well as risk factor modification per guideline directed care. 6. Additional analysis with CT FFR will be submitted and reported separately.   CT FFR: 1. Left Main:  No significant stenosis.   2. LAD: No significant stenosis. 3. LCX: No significant stenosis. 4. RCA: No significant focal stenosis. FFRct normal in the proximal RCA segment with slow taper in the mid to distal segments from 0.8 to 0.6 distally.   IMPRESSION: 1. CT FFR analysis didn't show any significant focal  stenosis in the LAD or LCx. 2.  Slow taper in FFRct values in the RCA with no focal stenosis. 3.  Recommend medical management. __________   2D echo 03/09/2020: 1. Left ventricular ejection fraction, by estimation, is 60 to 65%. The  left ventricle has normal function. The left ventricle has no  regional  wall motion abnormalities. Left ventricular diastolic parameters are  consistent with Grade I diastolic  dysfunction (impaired relaxation).   2. Right ventricular systolic function is normal. The right ventricular  size is normal. Tricuspid regurgitation signal is inadequate for assessing  PA pressure.   3. The mitral valve is degenerative. Trivial mitral valve regurgitation.  No evidence of mitral stenosis.   4. The aortic valve has an indeterminant number of cusps. There is mild  calcification of the aortic valve. There is moderate thickening of the  aortic valve. Aortic valve regurgitation is trivial. Mild to moderate  aortic valve sclerosis/calcification is  present, without any evidence of aortic stenosis.   5. The inferior vena cava is dilated in size with >50% respiratory  variability, suggesting right atrial pressure of 8 mmHg.   EKG:  EKG is ordered today.  The EKG ordered today demonstrates NSR, 73 bpm, no acute ST-T changes  Recent Labs: 12/07/2023: BNP 58.8 12/25/2023: Hemoglobin 14.4; Platelets 173 01/09/2024: BUN 18; Creatinine, Ser 1.02; Potassium 4.8; Sodium 137  Recent Lipid Panel    Component Value Date/Time   CHOL 152 11/14/2022 1108   CHOL 182 09/08/2020 1045   TRIG 174 (H) 11/14/2022 1108   HDL 60 11/14/2022 1108   HDL 57 09/08/2020 1045   CHOLHDL 2.5 11/14/2022 1108   VLDL 35 11/14/2022 1108   LDLCALC 57 11/14/2022 1108   LDLCALC 93 09/08/2020 1045   LDLDIRECT 116.0 02/12/2020 1233    PHYSICAL EXAM:    VS:  BP (!) 140/78 (BP Location: Left Arm, Patient Position: Sitting, Cuff Size: Normal)   Pulse 73 Comment: 70 oximeter  Ht 5' 5 (1.651 m)   Wt 180 lb (81.6 kg)   SpO2 98%   BMI 29.95 kg/m   BMI: Body mass index is 29.95 kg/m.  Physical Exam Vitals reviewed.  Constitutional:      Appearance: She is well-developed.  HENT:     Head: Normocephalic and atraumatic.  Eyes:     General:        Right eye: No discharge.        Left eye:  No discharge.  Cardiovascular:     Rate and Rhythm: Normal rate and regular rhythm.     Heart sounds: Normal heart sounds, S1 normal and S2 normal. Heart sounds not distant. No midsystolic click and no opening snap. No murmur heard.    No friction rub.  Pulmonary:     Effort: Pulmonary effort is normal. No respiratory distress.     Breath sounds: Normal breath sounds. No decreased breath sounds, wheezing, rhonchi or rales.  Musculoskeletal:     Cervical back: Normal range of motion.     Right lower leg: No edema.     Left lower leg: No edema.  Skin:    General: Skin is warm and dry.     Nails: There is no clubbing.  Neurological:     Mental Status: She is alert and oriented to person, place, and time.  Psychiatric:        Speech: Speech normal.        Behavior: Behavior normal.        Thought  Content: Thought content normal.        Judgment: Judgment normal.     Wt Readings from Last 3 Encounters:  01/28/24 180 lb (81.6 kg)  12/25/23 180 lb 6 oz (81.8 kg)  12/07/23 171 lb (77.6 kg)     ASSESSMENT & PLAN:   HTN: Blood pressure is improved owing discontinuation of HCTZ and amlodipine .  Remains on losartan  50 mg daily.  She will continue to monitor blood pressure at home.  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  Recent Lexiscan  MPI without evidence of ischemia.  Echo with preserved LV systolic function and no significant structural abnormalities.  Continue aggressive risk factor modification and secondary prevention including aspirin  81 mg and rosuvastatin  20 mg.  Near syncope: No further episodes.  Cardiac workup reassuring.  Postoperative A-fib: No documented evidence of recurrence.  HLD: LDL 57 in 11/2022 with normal AST/ALT in 12/2022.  Update CMP, lipid panel, and direct LDL.  Remains on rosuvastatin  20 mg.  Carotid artery disease: Pre-CABG carotid artery ultrasound in 12/2022 showed 40 to 59% right  ICA stenosis with 1 to 39% left ICA stenosis.  Update carotid artery ultrasound.  Remains on aspirin  and statin as outlined above.  Nocturnal bradycardia: Has been referred to sleep medicine.  No longer on metoprolol .    Disposition: F/u with Dr. Mady or an APP in 6 months.   Medication Adjustments/Labs and Tests Ordered: Current medicines are reviewed at length with the patient today.  Concerns regarding medicines are outlined above. Medication changes, Labs and Tests ordered today are summarized above and listed in the Patient Instructions accessible in Encounters.   Signed, Bernardino Bring, PA-C 01/28/2024 1:10 PM     [facility] [address] [place], [place] [address] [phone]

## 2024-01-28 ENCOUNTER — Ambulatory Visit: Attending: Physician Assistant | Admitting: Physician Assistant

## 2024-01-28 ENCOUNTER — Encounter: Payer: Self-pay | Admitting: Physician Assistant

## 2024-01-28 VITALS — BP 140/78 | HR 73 | Ht 65.0 in | Wt 180.0 lb

## 2024-01-28 DIAGNOSIS — I4891 Unspecified atrial fibrillation: Secondary | ICD-10-CM | POA: Insufficient documentation

## 2024-01-28 DIAGNOSIS — R55 Syncope and collapse: Secondary | ICD-10-CM | POA: Diagnosis not present

## 2024-01-28 DIAGNOSIS — I9789 Other postprocedural complications and disorders of the circulatory system, not elsewhere classified: Secondary | ICD-10-CM | POA: Diagnosis not present

## 2024-01-28 DIAGNOSIS — I10 Essential (primary) hypertension: Secondary | ICD-10-CM | POA: Insufficient documentation

## 2024-01-28 DIAGNOSIS — Z79899 Other long term (current) drug therapy: Secondary | ICD-10-CM | POA: Diagnosis not present

## 2024-01-28 DIAGNOSIS — I6523 Occlusion and stenosis of bilateral carotid arteries: Secondary | ICD-10-CM | POA: Diagnosis not present

## 2024-01-28 DIAGNOSIS — Z951 Presence of aortocoronary bypass graft: Secondary | ICD-10-CM | POA: Diagnosis not present

## 2024-01-28 DIAGNOSIS — E785 Hyperlipidemia, unspecified: Secondary | ICD-10-CM | POA: Diagnosis not present

## 2024-01-28 DIAGNOSIS — I2510 Atherosclerotic heart disease of native coronary artery without angina pectoris: Secondary | ICD-10-CM | POA: Insufficient documentation

## 2024-01-28 DIAGNOSIS — R001 Bradycardia, unspecified: Secondary | ICD-10-CM | POA: Insufficient documentation

## 2024-01-28 NOTE — Patient Instructions (Signed)
 Medication Instructions:  Your physician recommends that you continue on your current medications as directed. Please refer to the Current Medication list given to you today.   *If you need a refill on your cardiac medications before your next appointment, please call your pharmacy*  Lab Work: Your provider would like for you to have following labs drawn today CMeT, Lipids and Direct LDL.   If you have labs (blood work) drawn today and your tests are completely normal, you will receive your results only by: MyChart Message (if you have MyChart) OR A paper copy in the mail If you have any lab test that is abnormal or we need to change your treatment, we will call you to review the results.  Testing/Procedures: Your physician has requested that you have a carotid duplex. This test is an ultrasound of the carotid arteries in your neck. It looks at blood flow through these arteries that supply the brain with blood.   Allow one hour for this exam.  There are no restrictions or special instructions.  This will take place at [address]) [address], [place] [address]  Please note: We ask at that you not bring children with you during ultrasound (echo/ vascular) testing. Due to room size and safety concerns, children are not allowed in the ultrasound rooms during exams. Our front office staff cannot provide observation of children in our lobby area while testing is being conducted. An adult accompanying a patient to their appointment will only be allowed in the ultrasound room at the discretion of the ultrasound technician under special circumstances. We apologize for any inconvenience.   Follow-Up: At [facility], you and your health needs are our priority.  As part of our continuing mission to provide you with exceptional heart care, our providers are all part of one team.  This team includes your primary Cardiologist (physician) and Advanced Practice Providers or  APPs (Physician Assistants and Nurse Practitioners) who all work together to provide you with the care you need, when you need it.  Your next appointment:   6 month(s)  Provider:   You may see Lonni Hanson, MD or Bernardino Bring, PA-C

## 2024-01-29 ENCOUNTER — Ambulatory Visit: Payer: Self-pay | Admitting: Physician Assistant

## 2024-01-29 DIAGNOSIS — Z79899 Other long term (current) drug therapy: Secondary | ICD-10-CM

## 2024-01-29 DIAGNOSIS — E785 Hyperlipidemia, unspecified: Secondary | ICD-10-CM

## 2024-01-29 LAB — LIPID PANEL
Chol/HDL Ratio: 2.5 ratio (ref 0.0–4.4)
Cholesterol, Total: 191 mg/dL (ref 100–199)
HDL: 77 mg/dL (ref >39)
LDL Chol Calc (NIH): 87 mg/dL (ref 0–99)
Triglycerides: 163 mg/dL — ABNORMAL HIGH (ref 0–149)
VLDL Cholesterol Cal: 27 mg/dL (ref 5–40)

## 2024-01-29 LAB — COMPREHENSIVE METABOLIC PANEL WITH GFR
ALT: 15 IU/L (ref 0–32)
AST: 26 IU/L (ref 0–40)
Albumin: 4.6 g/dL (ref 3.7–4.7)
Alkaline Phosphatase: 77 IU/L (ref 48–129)
BUN/Creatinine Ratio: 17 (ref 12–28)
BUN: 17 mg/dL (ref 8–27)
Bilirubin Total: 0.6 mg/dL (ref 0.0–1.2)
CO2: 22 mmol/L (ref 20–29)
Calcium: 10.4 mg/dL — ABNORMAL HIGH (ref 8.7–10.3)
Chloride: 100 mmol/L (ref 96–106)
Creatinine, Ser: 0.99 mg/dL (ref 0.57–1.00)
Globulin, Total: 2.7 g/dL (ref 1.5–4.5)
Glucose: 100 mg/dL — ABNORMAL HIGH (ref 70–99)
Potassium: 4.8 mmol/L (ref 3.5–5.2)
Sodium: 140 mmol/L (ref 134–144)
Total Protein: 7.3 g/dL (ref 6.0–8.5)
eGFR: 55 mL/min/1.73 — ABNORMAL LOW (ref >59)

## 2024-01-29 LAB — LDL CHOLESTEROL, DIRECT: LDL Direct: 88 mg/dL (ref 0–99)

## 2024-01-29 MED ORDER — EZETIMIBE 10 MG PO TABS: 10.0000 mg | ORAL_TABLET | Freq: Every day | ORAL | 11 refills | Status: AC

## 2024-02-10 ENCOUNTER — Other Ambulatory Visit: Payer: Self-pay | Admitting: Family Medicine

## 2024-02-10 DIAGNOSIS — K219 Gastro-esophageal reflux disease without esophagitis: Secondary | ICD-10-CM

## 2024-02-10 DIAGNOSIS — R0602 Shortness of breath: Secondary | ICD-10-CM

## 2024-02-21 ENCOUNTER — Other Ambulatory Visit: Payer: Self-pay

## 2024-02-21 ENCOUNTER — Ambulatory Visit

## 2024-02-21 DIAGNOSIS — Z79899 Other long term (current) drug therapy: Secondary | ICD-10-CM | POA: Diagnosis not present

## 2024-02-21 DIAGNOSIS — E785 Hyperlipidemia, unspecified: Secondary | ICD-10-CM | POA: Diagnosis not present

## 2024-02-21 DIAGNOSIS — I6523 Occlusion and stenosis of bilateral carotid arteries: Secondary | ICD-10-CM | POA: Diagnosis present

## 2024-02-22 ENCOUNTER — Ambulatory Visit: Payer: Self-pay | Admitting: Physician Assistant

## 2024-02-22 LAB — LIPID PANEL
Chol/HDL Ratio: 2.1 ratio (ref 0.0–4.4)
Cholesterol, Total: 155 mg/dL (ref 100–199)
HDL: 74 mg/dL (ref >39)
LDL Chol Calc (NIH): 57 mg/dL (ref 0–99)
Triglycerides: 146 mg/dL (ref 0–149)
VLDL Cholesterol Cal: 24 mg/dL (ref 5–40)

## 2024-02-22 LAB — AST: AST: 24 IU/L (ref 0–40)

## 2024-02-22 LAB — ALT: ALT: 16 IU/L (ref 0–32)

## 2024-03-04 ENCOUNTER — Ambulatory Visit: Payer: Medicare Other

## 2024-03-04 ENCOUNTER — Ambulatory Visit: Admitting: Primary Care

## 2024-03-04 VITALS — Ht 65.0 in | Wt 180.0 lb

## 2024-03-04 DIAGNOSIS — Z0000 Encounter for general adult medical examination without abnormal findings: Secondary | ICD-10-CM

## 2024-03-04 NOTE — Patient Instructions (Signed)
 Christine [name],  Thank you for taking the time for your Medicare Wellness Visit. I appreciate your continued commitment to your health goals. Please review the care plan we discussed, and feel free to reach out if I can assist you further.  Please note that Annual Wellness Visits do not include a physical exam. Some assessments may be limited, especially if the visit was conducted virtually. If needed, we may recommend an in-person follow-up with your provider.  Ongoing Care Seeing your primary care provider every 3 to 6 months helps us  monitor your health and provide consistent, personalized care.   Referrals If a referral was made during today's visit and you haven't received any updates within two weeks, please contact the referred provider directly to check on the status.  Recommended Screenings:  Health Maintenance  Topic Date Due   Zoster (Shingles) Vaccine (1 of 2) Never done   Osteoporosis screening with Bone Density Scan  Never done   COVID-19 Vaccine (7 - 2025-26 season) 11/12/2023   Medicare Annual Wellness Visit  03/01/2024   Flu Shot  06/10/2024*   DTaP/Tdap/Td vaccine (2 - Td or Tdap) 05/08/2026   Pneumococcal Vaccine for age over 62  Completed   Meningitis B Vaccine  Aged Out  *Topic was postponed. The date shown is not the original due date.       03/04/2024   11:40 AM  Advanced Directives  Does Patient Have a Medical Advance Directive? Yes  Type of [name] Agent of [name];Living will  Does patient want to make changes to medical advance directive? No - Patient declined  Copy of Healthcare Power of Attorney in Chart? No - copy requested    Vision: Annual vision screenings are recommended for early detection of glaucoma, cataracts, and diabetic retinopathy. These exams can also reveal signs of chronic conditions such as diabetes and high blood pressure.  Dental: Annual dental screenings help detect early signs of oral cancer, gum disease,  and other conditions linked to overall health, including heart disease and diabetes.

## 2024-03-04 NOTE — Patient Instructions (Signed)
 [name],  Thank you for taking the time for your Medicare Wellness Visit. I appreciate your continued commitment to your health goals. Please review the care plan we discussed, and feel free to reach out if I can assist you further.  Please note that Annual Wellness Visits do not include a physical exam. Some assessments may be limited, especially if the visit was conducted virtually. If needed, we may recommend an in-person follow-up with your provider.  Ongoing Care Seeing your primary care provider every 3 to 6 months helps us  monitor your health and provide consistent, personalized care.   Referrals If a referral was made during today's visit and you haven't received any updates within two weeks, please contact the referred provider directly to check on the status.  Recommended Screenings:  Health Maintenance  Topic Date Due   Zoster (Shingles) Vaccine (1 of 2) Never done   Osteoporosis screening with Bone Density Scan  Never done   COVID-19 Vaccine (7 - 2025-26 season) 11/12/2023   Flu Shot  06/10/2024*   Medicare Annual Wellness Visit  03/04/2025   DTaP/Tdap/Td vaccine (2 - Td or Tdap) 05/08/2026   Pneumococcal Vaccine for age over 34  Completed   Meningitis B Vaccine  Aged Out  *Topic was postponed. The date shown is not the original due date.       03/04/2024   11:40 AM  Advanced Directives  Does Patient Have a Medical Advance Directive? Yes  Type of [name] Agent of [place];Living will  Does patient want to make changes to medical advance directive? No - Patient declined  Copy of Healthcare Power of Attorney in Chart? No - copy requested    Vision: Annual vision screenings are recommended for early detection of glaucoma, cataracts, and diabetic retinopathy. These exams can also reveal signs of chronic conditions such as diabetes and high blood pressure.  Dental: Annual dental screenings help detect early signs of oral cancer, gum disease,  and other conditions linked to overall health, including heart disease and diabetes.

## 2024-03-04 NOTE — Progress Notes (Signed)
 "  Please attest and cosign this visit due to patients primary care provider not being in the office at the time the visit was completed.    Chief Complaint  Patient presents with   Medicare Wellness     Subjective:   Christine Virginia  [name] is a 89 y.o. female who presents for a Medicare Annual Wellness Visit.  Visit info / Clinical Intake: Medicare Wellness Visit Type:: Subsequent Annual Wellness Visit Persons participating in visit and providing information:: patient Medicare Wellness Visit Mode:: Telephone If telephone:: video declined Since this visit was completed virtually, some vitals may be partially provided or unavailable. Missing vitals are due to the limitations of the virtual format.: Unable to obtain vitals - no equipment If Telephone or Video please confirm:: I connected with patient using audio/video enable telemedicine. I verified patient identity with two identifiers, discussed telehealth limitations, and patient agreed to proceed. Patient Location:: home Provider Location:: home office Interpreter Needed?: No Pre-visit prep was completed: yes AWV questionnaire completed by patient prior to visit?: no Living arrangements:: with family/others Patient's Overall Health Status Rating: very good Typical amount of pain: some Does pain affect daily life?: no Are you currently prescribed opioids?: no  Dietary Habits and Nutritional Risks How many meals a day?: (!) 1 Eats fruit and vegetables daily?: yes Most meals are obtained by: preparing own meals In the last 2 weeks, have you had any of the following?: none Diabetic:: no  Functional Status Activities of Daily Living (to include ambulation/medication): Independent Ambulation: Independent Medication Administration: Independent Home Management (perform basic housework or laundry): Independent Manage your own finances?: yes Primary transportation is: driving Concerns about vision?: no *vision screening is  required for WTM* Concerns about hearing?: no  Fall Screening Falls in the past year?: 0 Number of falls in past year: 0 Was there an injury with Fall?: 0 Fall Risk Category Calculator: 0 Patient Fall Risk Level: Low Fall Risk  Fall Risk Patient at Risk for Falls Due to: No Fall Risks Fall risk Follow up: Falls evaluation completed; Education provided; Falls prevention discussed  Home and Transportation Safety: All rugs have non-skid backing?: yes All stairs or steps have railings?: yes (lift chair but does not need now) Grab bars in the bathtub or shower?: (!) no Have non-skid surface in bathtub or shower?: yes Good home lighting?: yes Regular seat belt use?: yes Hospital stays in the last year:: no  Cognitive Assessment Difficulty concentrating, remembering, or making decisions? : yes Will 6CIT or Mini Cog be Completed: yes What year is it?: 0 points What month is it?: 0 points Give patient an address phrase to remember (5 components): [address] [place]  About what time is it?: 0 points Count backwards from 20 to 1: 0 points Say the months of the year in reverse: 0 points Repeat the address phrase from earlier: 0 points 6 CIT Score: 0 points  Advance Directives (For Healthcare) Does Patient Have a Medical Advance Directive?: Yes Does patient want to make changes to medical advance directive?: No - Patient declined Type of Advance Directive: Healthcare Power of [place]; Living will Copy of Healthcare Power of Attorney in Chart?: No - copy requested Copy of Living Will in Chart?: No - copy requested  Reviewed/Updated  Reviewed/Updated: Reviewed All (Medical, Surgical, Family, Medications, Allergies, Care Teams, Patient Goals)   Allergies (verified) Atorvastatin  and Pollen extract   Current Medications (verified) Outpatient Encounter Medications as of 03/04/2024  Medication Sig   albuterol  (VENTOLIN  HFA) 108 (90  Base) MCG/ACT inhaler Inhale 2 puffs  into the lungs every 6 (six) hours as needed for wheezing or shortness of breath.   aspirin  EC 81 MG tablet Take 1 tablet (81 mg total) by mouth daily. Swallow whole.   ezetimibe  (ZETIA ) 10 MG tablet Take 1 tablet (10 mg total) by mouth daily.   fluticasone -salmeterol (WIXELA INHUB) 250-50 MCG/ACT AEPB Inhale 1 puff into the lungs in the morning and at bedtime.   furosemide  (LASIX ) 20 MG tablet Take 1 tablet (20 mg total) by mouth daily as needed (for shortness of breath).   levothyroxine  (SYNTHROID ) 75 MCG tablet Take 1 tablet (75 mcg total) by mouth daily.   loratadine  (CLARITIN ) 10 MG tablet Take 10 mg by mouth daily as needed for allergies.   losartan  (COZAAR ) 50 MG tablet Take 1 tablet (50 mg total) by mouth daily.   RABEprazole  (ACIPHEX ) 20 MG tablet TAKE 1 TABLET(20 MG) BY MOUTH DAILY   rosuvastatin  (CRESTOR ) 20 MG tablet Take 1 tablet (20 mg total) by mouth daily.   No facility-administered encounter medications on file as of 03/04/2024.    History: Past Medical History:  Diagnosis Date   Asthma    Carotid arterial disease    a. 06/2019 Carotid U/S: RICA 50-69%. Mod LICA plaque.   Chicken pox    Diastolic dysfunction    a. 02/2020 Echo: EF 60-65%, no rwma, GrI DD. Nl RV size/fxn. Triv MR. Mild-mod Ao sclerosis w/o stenois.   Frequent headaches    GERD (gastroesophageal reflux disease)    Heart murmur    a. 02/2020 Echo: Triv MR. Mild-mod Ao sclerosis w/o stenosis.   Hyperlipidemia    Hypertension    Hypothyroidism    Nonobstructive CAD (coronary artery disease)    a.  Remote Cath in [facility] - reportedly nonobs dzs; b. 03/2020 Cor CTA:  Ca2+ = 1844 (94th %'ile). LAD 50p, RCA 25-49 (FFRct nl in prox RCA w/ slow taper in mid-dist segments from 0.8-->0.6).   Rheumatic fever    Urine incontinence    Past Surgical History:  Procedure Laterality Date   ABDOMINAL HYSTERECTOMY     APPENDECTOMY     BREAST BIOPSY     CORONARY ARTERY BYPASS GRAFT N/A 12/13/2022   Procedure: CORONARY  ARTERY BYPASS GRAFTING (CABG) TIMES FOUR UTILIZING LEFT INTERNAL MAMMARY ARTERY AND ENDOSCOPIC VEIN HARVEST RIGHT GREATER SAPHENOUS VEIN;  Surgeon: Kerrin Elspeth BROCKS, MD;  Location: [facility] OR;  Service: Open Heart Surgery;  Laterality: N/A;   LEFT HEART CATH AND CORONARY ANGIOGRAPHY N/A 12/11/2022   Procedure: LEFT HEART CATH AND CORONARY ANGIOGRAPHY;  Surgeon: Mady Bruckner, MD;  Location: [facility] INVASIVE CV LAB;  Service: Cardiovascular;  Laterality: N/A;   MASTECTOMY Bilateral 04/1979   TEE WITHOUT CARDIOVERSION N/A 12/13/2022   Procedure: TRANSESOPHAGEAL ECHOCARDIOGRAM;  Surgeon: Kerrin Elspeth BROCKS, MD;  Location[facility];  Service: Open Heart Surgery;  Laterality: N/A;   Family History  Problem Relation Age of Onset   Early death Mother 39   Kidney disease Mother        bright disease   Other Father        tumor on the side of his leg   Heart disease Brother    Heart attack Brother 34   Heart disease Brother 51   Leukemia Daughter    Valvular heart disease Daughter        s/p mitral valve replacement   Social History   Occupational History   Not on file  Tobacco Use  Smoking status: Never   Smokeless tobacco: Never  Vaping Use   Vaping status: Never Used  Substance and Sexual Activity   Alcohol use: Yes    Comment: maybe once a year   Drug use: Never   Sexual activity: Not Currently   Tobacco Counseling Counseling given: Not Answered  SDOH Screenings   Food Insecurity: No Food Insecurity (03/04/2024)  Housing: Unknown (03/04/2024)  Transportation Needs: No Transportation Needs (03/04/2024)  Utilities: Not At Risk (03/04/2024)  Alcohol Screen: Low Risk (03/02/2023)  Depression (PHQ2-9): Low Risk (03/04/2024)  Financial Resource Strain: Low Risk (03/02/2023)  Physical Activity: Insufficiently Active (03/04/2024)  Social Connections: Moderately Isolated (03/04/2024)  Stress: No Stress Concern Present (03/04/2024)  Tobacco Use: Low Risk (03/04/2024)  Health  Literacy: Adequate Health Literacy (03/04/2024)   See flowsheets for full screening details  Depression Screen PHQ 2 & 9 Depression Scale- Over the past 2 weeks, how often have you been bothered by any of the following problems? Little interest or pleasure in doing things: 0 Feeling down, depressed, or hopeless (PHQ Adolescent also includes...irritable): 0 PHQ-2 Total Score: 0 Trouble falling or staying asleep, or sleeping too much: 1 Feeling tired or having little energy: 1 Poor appetite or overeating (PHQ Adolescent also includes...weight loss): 1 Feeling bad about yourself - or that you are a failure or have let yourself or your family down: 0 Trouble concentrating on things, such as reading the newspaper or watching television (PHQ Adolescent also includes...like school work): 0 Moving or speaking so slowly that other people could have noticed. Or the opposite - being so fidgety or restless that you have been moving around a lot more than usual: 0 Thoughts that you would be better off dead, or of hurting yourself in some way: 0 PHQ-9 Total Score: 3 If you checked off any problems, how difficult have these problems made it for you to do your work, take care of things at home, or get along with other people?: Not difficult at all     Goals Addressed               This Visit's Progress     I would like to move back home ([place] ) (pt-stated)        Patient Stated   On track     03/02/23 pt will Continue self care            Objective:    Today's Vitals   03/04/24 1133  Weight: 180 lb (81.6 kg)  Height: 5' 5 (1.651 m)   Body mass index is 29.95 kg/m.  Hearing/Vision screen Vision Screening - Comments:: Pt not UTD w/eye visits :gets readers from OTC Immunizations and Health Maintenance Health Maintenance  Topic Date Due   Zoster Vaccines- Shingrix (1 of 2) Never done   Bone Density Scan  Never done   COVID-19 Vaccine (7 - 2025-26 season) 11/12/2023   Medicare  Annual Wellness (AWV)  03/01/2024   Influenza Vaccine  06/10/2024 (Originally 10/12/2023)   DTaP/Tdap/Td (2 - Td or Tdap) 05/08/2026   Pneumococcal Vaccine: 50+ Years  Completed   Meningococcal B Vaccine  Aged Out        Assessment/Plan:  This is a routine wellness examination for Christine [name].  Patient Care Team: Wendee Lynwood HERO, NP as PCP - General (Nurse Practitioner) End, Lonni, MD as PCP - Cardiology (Cardiology) End, Lonni, MD as Consulting Physician (Cardiology)  I have personally reviewed and noted the following in the patients chart:   Medical  and social history Use of alcohol, tobacco or illicit drugs  Current medications and supplements including opioid prescriptions. Functional ability and status Nutritional status Physical activity Advanced directives List of other physicians Hospitalizations, surgeries, and ER visits in previous 12 months Vitals Screenings to include cognitive, depression, and falls Referrals and appointments  No orders of the defined types were placed in this encounter.  In addition, I have reviewed and discussed with patient certain preventive protocols, quality metrics, and best practice recommendations. A written personalized care plan for preventive services as well as general preventive health recommendations were provided to patient.   Erminio LITTIE Saris, LPN   [id number]    After Visit Summary: (MyChart) Due to this being a telephonic visit, the after visit summary with patients personalized plan was offered to patient via MyChart   Nurse Notes: Patient advised to keep follow-up appointment with PCP (03/07/24 CPE) Appointment(s) made: (AWV/CPE Dec 2026) HM Addressed: Pt says she has not had eye exam in 4 years and declines:she says she sees fine and uses OTC readers. Pt will get Covid vaccine at pharmacy if she desires to obtain.Pt 89y/o:no indication for Bone Density.(Pt declines also).  "

## 2024-03-04 NOTE — Progress Notes (Addendum)
 "  Chief Complaint  Patient presents with   Medicare Wellness     Subjective:  Please attest and cosign this visit due to patients primary care provider not being in the office at the time the visit was completed.  (Pt of Lynwood Crandall, NP)   Christine [name] is a 89 y.o. female who presents for a Medicare Annual Wellness Visit.  Visit info / Clinical Intake: Medicare Wellness Visit Type:: Subsequent Annual Wellness Visit Persons participating in visit and providing information:: patient Medicare Wellness Visit Mode:: Telephone If telephone:: video declined Since this visit was completed virtually, some vitals may be partially provided or unavailable. Missing vitals are due to the limitations of the virtual format.: Documented vitals are patient reported If Telephone or Video please confirm:: I connected with patient using audio/video enable telemedicine. I verified patient identity with two identifiers, discussed telehealth limitations, and patient agreed to proceed. Patient Location:: home Provider Location:: home office Interpreter Needed?: No Pre-visit prep was completed: yes AWV questionnaire completed by patient prior to visit?: no Living arrangements:: with family/others Patient's Overall Health Status Rating: very good Typical amount of pain: some Does pain affect daily life?: no Are you currently prescribed opioids?: no  Dietary Habits and Nutritional Risks How many meals a day?: (!) 1 Eats fruit and vegetables daily?: yes Most meals are obtained by: preparing own meals In the last 2 weeks, have you had any of the following?: none Diabetic:: no  Functional Status Activities of Daily Living (to include ambulation/medication): Independent Ambulation: Independent Medication Administration: Independent Home Management (perform basic housework or laundry): Independent Manage your own finances?: yes Primary transportation is: driving Concerns about vision?: no  *vision screening is required for WTM* Concerns about hearing?: no  Fall Screening Falls in the past year?: 0 Number of falls in past year: 0 Was there an injury with Fall?: 0 Fall Risk Category Calculator: 0 Patient Fall Risk Level: Low Fall Risk  Fall Risk Patient at Risk for Falls Due to: No Fall Risks Fall risk Follow up: Falls evaluation completed; Falls prevention discussed  Home and Transportation Safety: All rugs have non-skid backing?: yes All stairs or steps have railings?: yes (lift chair but does not need now) Grab bars in the bathtub or shower?: (!) no Have non-skid surface in bathtub or shower?: yes Good home lighting?: yes Regular seat belt use?: yes Hospital stays in the last year:: no  Cognitive Assessment Difficulty concentrating, remembering, or making decisions? : yes Will 6CIT or Mini Cog be Completed: yes What year is it?: 0 points What month is it?: 0 points Give patient an address phrase to remember (5 components): [address] [place]  About what time is it?: 0 points Count backwards from 20 to 1: 0 points Say the months of the year in reverse: 0 points Repeat the address phrase from earlier: 0 points 6 CIT Score: 0 points  Advance Directives (For Healthcare) Does Patient Have a Medical Advance Directive?: Yes Does patient want to make changes to medical advance directive?: No - Patient declined Type of Advance Directive: Healthcare Power of [place]; Living will Copy of Healthcare Power of Attorney in Chart?: No - copy requested Copy of Living Will in Chart?: No - copy requested  Reviewed/Updated  Reviewed/Updated: Reviewed All (Medical, Surgical, Family, Medications, Allergies, Care Teams, Patient Goals)    Allergies (verified) Atorvastatin  and Pollen extract   Current Medications (verified) Outpatient Encounter Medications as of 03/04/2024  Medication Sig   albuterol  (VENTOLIN  HFA) 108 (  90 Base) MCG/ACT inhaler Inhale 2 puffs  into the lungs every 6 (six) hours as needed for wheezing or shortness of breath.   aspirin  EC 81 MG tablet Take 1 tablet (81 mg total) by mouth daily. Swallow whole.   ezetimibe  (ZETIA ) 10 MG tablet Take 1 tablet (10 mg total) by mouth daily.   fluticasone -salmeterol (WIXELA INHUB) 250-50 MCG/ACT AEPB Inhale 1 puff into the lungs in the morning and at bedtime.   furosemide  (LASIX ) 20 MG tablet Take 1 tablet (20 mg total) by mouth daily as needed (for shortness of breath).   levothyroxine  (SYNTHROID ) 75 MCG tablet Take 1 tablet (75 mcg total) by mouth daily.   loratadine  (CLARITIN ) 10 MG tablet Take 10 mg by mouth daily as needed for allergies.   losartan  (COZAAR ) 50 MG tablet Take 1 tablet (50 mg total) by mouth daily.   RABEprazole  (ACIPHEX ) 20 MG tablet TAKE 1 TABLET(20 MG) BY MOUTH DAILY   rosuvastatin  (CRESTOR ) 20 MG tablet Take 1 tablet (20 mg total) by mouth daily.   No facility-administered encounter medications on file as of 03/04/2024.    History: Past Medical History:  Diagnosis Date   Asthma    Carotid arterial disease    a. 06/2019 Carotid U/S: RICA 50-69%. Mod LICA plaque.   Chicken pox    Diastolic dysfunction    a. 02/2020 Echo: EF 60-65%, no rwma, GrI DD. Nl RV size/fxn. Triv MR. Mild-mod Ao sclerosis w/o stenois.   Frequent headaches    GERD (gastroesophageal reflux disease)    Heart murmur    a. 02/2020 Echo: Triv MR. Mild-mod Ao sclerosis w/o stenosis.   Hyperlipidemia    Hypertension    Hypothyroidism    Nonobstructive CAD (coronary artery disease)    a.  [facility] Cath in [facility] - reportedly nonobs dzs; b. 03/2020 Cor CTA:  Ca2+ = 1844 (94th %'ile). LAD 50p, RCA 25-49 (FFRct nl in prox RCA w/ slow taper in mid-dist segments from 0.8-->0.6).   Rheumatic fever    Urine incontinence    Past Surgical History:  Procedure Laterality Date   ABDOMINAL HYSTERECTOMY     APPENDECTOMY     BREAST BIOPSY     CORONARY ARTERY BYPASS GRAFT N/A 12/13/2022   Procedure: CORONARY  ARTERY BYPASS GRAFTING (CABG) TIMES FOUR UTILIZING LEFT INTERNAL MAMMARY ARTERY AND ENDOSCOPIC VEIN HARVEST RIGHT GREATER SAPHENOUS VEIN;  Surgeon: Kerrin Elspeth BROCKS, MD;  Location: [facility] OR;  Service: Open Heart Surgery;  Laterality: N/A;   LEFT HEART CATH AND CORONARY ANGIOGRAPHY N/A 12/11/2022   Procedure: LEFT HEART CATH AND CORONARY ANGIOGRAPHY;  Surgeon: Mady Bruckner, MD;  Location: [facility] INVASIVE CV LAB;  Service: Cardiovascular;  Laterality: N/A;   MASTECTOMY Bilateral 04/1979   TEE WITHOUT CARDIOVERSION N/A 12/13/2022   Procedure: TRANSESOPHAGEAL ECHOCARDIOGRAM;  Surgeon: Kerrin Elspeth BROCKS, MD;  Location: [facility] OR;  Service: Open Heart Surgery;  Laterality: N/A;   Family History  Problem Relation Age of Onset   Early death Mother 80   Kidney disease Mother        bright disease   Other Father        tumor on the side of his leg   Heart disease Brother    Heart attack Brother 18   Heart disease Brother 83   Leukemia Daughter    Valvular heart disease Daughter        s/p mitral valve replacement   Social History   Occupational History   Not on file  Tobacco Use  Smoking status: Never   Smokeless tobacco: Never  Vaping Use   Vaping status: Never Used  Substance and Sexual Activity   Alcohol use: Yes    Alcohol/week: 1.0 standard drink of alcohol    Types: 1 Glasses of wine per week    Comment: maybe once a year   Drug use: Never   Sexual activity: Not Currently   Tobacco Counseling Counseling given: No  SDOH Screenings   Food Insecurity: No Food Insecurity (03/04/2024)  Housing: Unknown (03/04/2024)  Transportation Needs: No Transportation Needs (03/04/2024)  Utilities: Not At Risk (03/04/2024)  Alcohol Screen: Low Risk (03/02/2023)  Depression (PHQ2-9): Low Risk (03/04/2024)  Financial Resource Strain: Low Risk (03/02/2023)  Physical Activity: Insufficiently Active (03/04/2024)  Social Connections: Moderately Isolated (03/04/2024)  Stress: No Stress  Concern Present (03/04/2024)  Tobacco Use: Low Risk (03/04/2024)  Health Literacy: Adequate Health Literacy (03/04/2024)   See flowsheets for full screening details  Depression Screen PHQ 2 & 9 Depression Scale- Over the past 2 weeks, how often have you been bothered by any of the following problems? Little interest or pleasure in doing things: 0 Feeling down, depressed, or hopeless (PHQ Adolescent also includes...irritable): 0 PHQ-2 Total Score: 0 Trouble falling or staying asleep, or sleeping too much: 0 Feeling tired or having little energy: 0 Poor appetite or overeating (PHQ Adolescent also includes...weight loss): 0 Feeling bad about yourself - or that you are a failure or have let yourself or your family down: 0 Trouble concentrating on things, such as reading the newspaper or watching television (PHQ Adolescent also includes...like school work): 0 Moving or speaking so slowly that other people could have noticed. Or the opposite - being so fidgety or restless that you have been moving around a lot more than usual: 0 Thoughts that you would be better off dead, or of hurting yourself in some way: 0 PHQ-9 Total Score: 0 If you checked off any problems, how difficult have these problems made it for you to do your work, take care of things at home, or get along with other people?: Not difficult at all  Depression Treatment Depression Interventions/Treatment : [id number] Score <4 Follow-up Not Indicated     Goals Addressed             This Visit's Progress    COMPLETED: Weight (lb) < 160 lb (72.6 kg)       Patient stated she plans to continue taking meds daily             Objective:    Today's Vitals   03/04/24 1248  Weight: 180 lb (81.6 kg)  Height: 5' 5 (1.651 m)   Body mass index is 29.95 kg/m.  Hearing/Vision screen No results found. Immunizations and Health Maintenance Health Maintenance  Topic Date Due   Zoster Vaccines- Shingrix (1 of 2) Never done   Bone  Density Scan  Never done   COVID-19 Vaccine (7 - 2025-26 season) 11/12/2023   Influenza Vaccine  06/10/2024 (Originally 10/12/2023)   Medicare Annual Wellness (AWV)  03/04/2025   DTaP/Tdap/Td (2 - Td or Tdap) 05/08/2026   Pneumococcal Vaccine: 50+ Years  Completed   Meningococcal B Vaccine  Aged Out        Assessment/Plan:  This is a routine wellness examination for Christine [name].  Patient Care Team: Wendee Lynwood HERO, NP as PCP - General (Nurse Practitioner) End, Lonni, MD as PCP - Cardiology (Cardiology) End, Lonni, MD as Consulting Physician (Cardiology)  I have personally reviewed and noted  the following in the patients chart:   Medical and social history Use of alcohol, tobacco or illicit drugs  Current medications and supplements including opioid prescriptions. Functional ability and status Nutritional status Physical activity Advanced directives List of other physicians Hospitalizations, surgeries, and ER visits in previous 12 months Vitals Screenings to include cognitive, depression, and falls Referrals and appointments  No orders of the defined types were placed in this encounter.  In addition, I have reviewed and discussed with patient certain preventive protocols, quality metrics, and best practice recommendations. A written personalized care plan for preventive services as well as general preventive health recommendations were provided to patient.   Verdie CHRISTELLA Saba, CMA   03/04/2024   Return in 1 year (on 03/04/2025).  After Visit Summary: (MyChart) Due to this being a telephonic visit, the after visit summary with patients personalized plan was offered to patient via MyChart   Nurse Notes: scheduled 2026 AWV/CPE appts "

## 2024-03-04 NOTE — Progress Notes (Signed)
 "  Please attest and cosign this visit due to patients primary care provider not being in the office at the time the visit was completed.    Chief Complaint  Patient presents with   Medicare Wellness     Subjective:   Christine [place]  [name] is a 89 y.o. female who presents for a Medicare Annual Wellness Visit.  Visit info / Clinical Intake: Medicare Wellness Visit Type:: Subsequent Annual Wellness Visit Persons participating in visit and providing information:: patient Medicare Wellness Visit Mode:: Telephone If telephone:: video declined Since this visit was completed virtually, some vitals may be partially provided or unavailable. Missing vitals are due to the limitations of the virtual format.: Unable to obtain vitals - no equipment If Telephone or Video please confirm:: I connected with patient using audio/video enable telemedicine. I verified patient identity with two identifiers, discussed telehealth limitations, and patient agreed to proceed. Patient Location:: home Provider Location:: home office Interpreter Needed?: No Pre-visit prep was completed: yes AWV questionnaire completed by patient prior to visit?: no Living arrangements:: with family/others Patient's Overall Health Status Rating: very good Typical amount of pain: some Does pain affect daily life?: no Are you currently prescribed opioids?: no  Dietary Habits and Nutritional Risks How many meals a day?: (!) 1 Eats fruit and vegetables daily?: yes Most meals are obtained by: preparing own meals In the last 2 weeks, have you had any of the following?: none Diabetic:: no  Functional Status Activities of Daily Living (to include ambulation/medication): Independent Ambulation: Independent Medication Administration: Independent Home Management (perform basic housework or laundry): Independent Manage your own finances?: yes Primary transportation is: driving Concerns about vision?: no *vision screening is  required for WTM* Concerns about hearing?: no  Fall Screening Falls in the past year?: 0 Number of falls in past year: 0 Was there an injury with Fall?: 0 Fall Risk Category Calculator: 0 Patient Fall Risk Level: Low Fall Risk  Fall Risk Patient at Risk for Falls Due to: No Fall Risks Fall risk Follow up: Falls evaluation completed; Education provided; Falls prevention discussed  Home and Transportation Safety: All rugs have non-skid backing?: yes All stairs or steps have railings?: yes (lift chair but does not need now) Grab bars in the bathtub or shower?: (!) no Have non-skid surface in bathtub or shower?: yes Good home lighting?: yes Regular seat belt use?: yes Hospital stays in the last year:: no  Cognitive Assessment Difficulty concentrating, remembering, or making decisions? : yes Will 6CIT or Mini Cog be Completed: yes What year is it?: 0 points What month is it?: 0 points Give patient an address phrase to remember (5 components): [address] [place]  About what time is it?: 0 points Count backwards from 20 to 1: 0 points Say the months of the year in reverse: 0 points Repeat the address phrase from earlier: 0 points 6 CIT Score: 0 points  Advance Directives (For Healthcare) Does Patient Have a Medical Advance Directive?: Yes Does patient want to make changes to medical advance directive?: No - Patient declined Type of Advance Directive: Healthcare Power of [place]; Living will Copy of Healthcare Power of Attorney in Chart?: No - copy requested Copy of Living Will in Chart?: No - copy requested  Reviewed/Updated  Reviewed/Updated: Reviewed All (Medical, Surgical, Family, Medications, Allergies, Care Teams, Patient Goals)   Allergies (verified) Atorvastatin  and Pollen extract   Current Medications (verified) Outpatient Encounter Medications as of 03/04/2024  Medication Sig   albuterol  (VENTOLIN  HFA) 108 (90  Base) MCG/ACT inhaler Inhale 2 puffs  into the lungs every 6 (six) hours as needed for wheezing or shortness of breath.   aspirin  EC 81 MG tablet Take 1 tablet (81 mg total) by mouth daily. Swallow whole.   ezetimibe  (ZETIA ) 10 MG tablet Take 1 tablet (10 mg total) by mouth daily.   fluticasone -salmeterol (WIXELA INHUB) 250-50 MCG/ACT AEPB Inhale 1 puff into the lungs in the morning and at bedtime.   furosemide  (LASIX ) 20 MG tablet Take 1 tablet (20 mg total) by mouth daily as needed (for shortness of breath).   levothyroxine  (SYNTHROID ) 75 MCG tablet Take 1 tablet (75 mcg total) by mouth daily.   loratadine  (CLARITIN ) 10 MG tablet Take 10 mg by mouth daily as needed for allergies.   losartan  (COZAAR ) 50 MG tablet Take 1 tablet (50 mg total) by mouth daily.   RABEprazole  (ACIPHEX ) 20 MG tablet TAKE 1 TABLET(20 MG) BY MOUTH DAILY   rosuvastatin  (CRESTOR ) 20 MG tablet Take 1 tablet (20 mg total) by mouth daily.   No facility-administered encounter medications on file as of 03/04/2024.    History: Past Medical History:  Diagnosis Date   Asthma    Carotid arterial disease    a. 06/2019 Carotid U/S: RICA 50-69%. Mod LICA plaque.   Chicken pox    Diastolic dysfunction    a. 02/2020 Echo: EF 60-65%, no rwma, GrI DD. Nl RV size/fxn. Triv MR. Mild-mod Ao sclerosis w/o stenois.   Frequent headaches    GERD (gastroesophageal reflux disease)    Heart murmur    a. 02/2020 Echo: Triv MR. Mild-mod Ao sclerosis w/o stenosis.   Hyperlipidemia    Hypertension    Hypothyroidism    Nonobstructive CAD (coronary artery disease)    a.  Remote Cath in [facility] - reportedly nonobs dzs; b. 03/2020 Cor CTA:  Ca2+ = 1844 (94th %'ile). LAD 50p, RCA 25-49 (FFRct nl in prox RCA w/ slow taper in mid-dist segments from 0.8-->0.6).   Rheumatic fever    Urine incontinence    Past Surgical History:  Procedure Laterality Date   ABDOMINAL HYSTERECTOMY     APPENDECTOMY     BREAST BIOPSY     CORONARY ARTERY BYPASS GRAFT N/A 12/13/2022   Procedure: CORONARY  ARTERY BYPASS GRAFTING (CABG) TIMES FOUR UTILIZING LEFT INTERNAL MAMMARY ARTERY AND ENDOSCOPIC VEIN HARVEST RIGHT GREATER SAPHENOUS VEIN;  Surgeon: Kerrin Elspeth BROCKS, MD;  Location: [facility] OR;  Service: Open Heart Surgery;  Laterality: N/A;   LEFT HEART CATH AND CORONARY ANGIOGRAPHY N/A 12/11/2022   Procedure: LEFT HEART CATH AND CORONARY ANGIOGRAPHY;  Surgeon: Mady Bruckner, MD;  Location: [facility] INVASIVE CV LAB;  Service: Cardiovascular;  Laterality: N/A;   MASTECTOMY Bilateral 04/1979   TEE WITHOUT CARDIOVERSION N/A 12/13/2022   Procedure: TRANSESOPHAGEAL ECHOCARDIOGRAM;  Surgeon: Kerrin Elspeth BROCKS, MD;  Location: [facility] OR;  Service: Open Heart Surgery;  Laterality: N/A;   Family History  Problem Relation Age of Onset   Early death Mother 39   Kidney disease Mother        bright disease   Other Father        tumor on the side of his leg   Heart disease Brother    Heart attack Brother 34   Heart disease Brother 51   Leukemia Daughter    Valvular heart disease Daughter        s/p mitral valve replacement   Social History   Occupational History   Not on file  Tobacco Use  Smoking status: Never   Smokeless tobacco: Never  Vaping Use   Vaping status: Never Used  Substance and Sexual Activity   Alcohol use: Yes    Comment: maybe once a year   Drug use: Never   Sexual activity: Not Currently   Tobacco Counseling Counseling given: Not Answered  SDOH Screenings   Food Insecurity: No Food Insecurity (03/04/2024)  Housing: Unknown (03/04/2024)  Transportation Needs: No Transportation Needs (03/04/2024)  Utilities: Not At Risk (03/04/2024)  Alcohol Screen: Low Risk (03/02/2023)  Depression (PHQ2-9): Low Risk (03/04/2024)  Financial Resource Strain: Low Risk (03/02/2023)  Physical Activity: Insufficiently Active (03/04/2024)  Social Connections: Moderately Isolated (03/04/2024)  Stress: No Stress Concern Present (03/04/2024)  Tobacco Use: Low Risk (03/04/2024)  Health  Literacy: Adequate Health Literacy (03/04/2024)   See flowsheets for full screening details  Depression Screen PHQ 2 & 9 Depression Scale- Over the past 2 weeks, how often have you been bothered by any of the following problems? Little interest or pleasure in doing things: 0 Feeling down, depressed, or hopeless (PHQ Adolescent also includes...irritable): 0 PHQ-2 Total Score: 0 Trouble falling or staying asleep, or sleeping too much: 1 Feeling tired or having little energy: 1 Poor appetite or overeating (PHQ Adolescent also includes...weight loss): 1 Feeling bad about yourself - or that you are a failure or have let yourself or your family down: 0 Trouble concentrating on things, such as reading the newspaper or watching television (PHQ Adolescent also includes...like school work): 0 Moving or speaking so slowly that other people could have noticed. Or the opposite - being so fidgety or restless that you have been moving around a lot more than usual: 0 Thoughts that you would be better off dead, or of hurting yourself in some way: 0 PHQ-9 Total Score: 3 If you checked off any problems, how difficult have these problems made it for you to do your work, take care of things at home, or get along with other people?: Not difficult at all     Goals Addressed               This Visit's Progress     I would like to move back home ([place] ) (pt-stated)        Patient Stated   On track     03/02/23 pt will Continue self care            Objective:    Today's Vitals   03/04/24 1133  Weight: 180 lb (81.6 kg)  Height: 5' 5 (1.651 m)   Body mass index is 29.95 kg/m.  Hearing/Vision screen Vision Screening - Comments:: Pt not UTD w/eye visits :gets readers from OTC Immunizations and Health Maintenance Health Maintenance  Topic Date Due   Zoster Vaccines- Shingrix (1 of 2) Never done   Bone Density Scan  Never done   COVID-19 Vaccine (7 - 2025-26 season) 11/12/2023   Medicare  Annual Wellness (AWV)  03/01/2024   Influenza Vaccine  06/10/2024 (Originally 10/12/2023)   DTaP/Tdap/Td (2 - Td or Tdap) 05/08/2026   Pneumococcal Vaccine: 50+ Years  Completed   Meningococcal B Vaccine  Aged Out        Assessment/Plan:  This is a routine wellness examination for Christine [name].  Patient Care Team: Wendee Lynwood HERO, NP as PCP - General (Nurse Practitioner) End, Lonni, MD as PCP - Cardiology (Cardiology) End, Lonni, MD as Consulting Physician (Cardiology)  I have personally reviewed and noted the following in the patients chart:   Medical  and social history Use of alcohol, tobacco or illicit drugs  Current medications and supplements including opioid prescriptions. Functional ability and status Nutritional status Physical activity Advanced directives List of other physicians Hospitalizations, surgeries, and ER visits in previous 12 months Vitals Screenings to include cognitive, depression, and falls Referrals and appointments  No orders of the defined types were placed in this encounter.  In addition, I have reviewed and discussed with patient certain preventive protocols, quality metrics, and best practice recommendations. A written personalized care plan for preventive services as well as general preventive health recommendations were provided to patient.   Christine LITTIE Saris, LPN   [id number]    After Visit Summary: (MyChart) Due to this being a telephonic visit, the after visit summary with patients personalized plan was offered to patient via MyChart   Nurse Notes: Patient advised to keep follow-up appointment with PCP (03/07/24 CPE) Appointment(s) made: (AWV/CPE Dec 2026) HM Addressed: Pt says she has not had eye exam in 4 years and declines:she says she sees fine and uses OTC readers. Pt will get Covid vaccine at pharmacy if she desires to obtain.Pt 89y/o:no indication for Bone Density.(Pt declines also).  "

## 2024-03-07 ENCOUNTER — Encounter: Payer: Self-pay | Admitting: Nurse Practitioner

## 2024-03-07 ENCOUNTER — Ambulatory Visit: Admitting: Nurse Practitioner

## 2024-03-07 DIAGNOSIS — I10 Essential (primary) hypertension: Secondary | ICD-10-CM | POA: Diagnosis not present

## 2024-03-07 DIAGNOSIS — K219 Gastro-esophageal reflux disease without esophagitis: Secondary | ICD-10-CM | POA: Diagnosis not present

## 2024-03-07 DIAGNOSIS — G2581 Restless legs syndrome: Secondary | ICD-10-CM | POA: Diagnosis not present

## 2024-03-07 DIAGNOSIS — J4520 Mild intermittent asthma, uncomplicated: Secondary | ICD-10-CM

## 2024-03-07 DIAGNOSIS — I25110 Atherosclerotic heart disease of native coronary artery with unstable angina pectoris: Secondary | ICD-10-CM | POA: Diagnosis not present

## 2024-03-07 DIAGNOSIS — E039 Hypothyroidism, unspecified: Secondary | ICD-10-CM | POA: Diagnosis not present

## 2024-03-07 MED ORDER — GABAPENTIN 100 MG PO CAPS: 100.0000 mg | ORAL_CAPSULE | Freq: Every day | ORAL | 0 refills | Status: DC

## 2024-03-07 NOTE — Assessment & Plan Note (Signed)
 Most recent chemistry showed hypercalcemia will check PTH, intact calcium , vitamin D.

## 2024-03-07 NOTE — Assessment & Plan Note (Signed)
 Will check TSH and iron levels.  Will start gabapentin  1 to 300 mg nightly.

## 2024-03-07 NOTE — Assessment & Plan Note (Signed)
 Patient currently maintained on Aciphex  20 mg daily.  Well-controlled continue

## 2024-03-07 NOTE — Patient Instructions (Signed)
 Nice to see you today You can take 1 gabapentin  before bed, if that does not help in the next 2 days you can take 2 capsules (200mg ) before bed. If that does not help after 2-3 days you can go up to 3 capsules (300mg ) at beditime

## 2024-03-07 NOTE — Assessment & Plan Note (Signed)
 History of the same.  Patient currently maintained on levothyroxine  75 mcg daily.  Pending thyroid  panel today

## 2024-03-07 NOTE — Assessment & Plan Note (Signed)
 History of the same.  Patient currently on Wixela 1 puff twice daily along with albuterol  as needed.  Stable continue medication as prescribed

## 2024-03-07 NOTE — Assessment & Plan Note (Signed)
 Patient currently maintained daily.  Blood pressure controlled in office.  Patient tolerates medication well.  Continue medication as prescribed follow-up with cardiologist as recommended

## 2024-03-07 NOTE — Assessment & Plan Note (Signed)
 Followed by cardiology status post CABG.  Recently went under nuc med stress test.  Patient on rosuvastatin  20 mg daily and ezetimibe  10 mg daily.  Patient's lipid panel up-to-date.

## 2024-03-07 NOTE — Progress Notes (Signed)
 "  Established Patient Office Visit  Subjective   Patient ID: Christine Virginia  [name], female    DOB: [birth year]  Age: 89 y.o. MRN: [id number]  Chief Complaint  Patient presents with   Annual Exam    HPI  HTN: Patient currently maintained on losartan  50 mg daily.  Followed by cardiology  CAD: Patient status post CABG maintained on rosuvastatin  20 mg daily and ezetimibe  10 mg daily along with baby aspirin .  Asthma: Patient maintained on albuterol  as needed and Wixela 1 puff twice daily. She does not use the albuterol  often. She will use it if she feels tight. Weekly albuterol  use.   Hypothyroidism: Patient currently maintained on levothyroxine  75 mcg daily  GERD: Patient currently maintained on Aciphex  20 mg daily  RLS: states that it has been for months. Statse that it does keep her from sleeping. State that the cardiologist has menioted. Has tried OTC that has not helped.  for complete physical and follow up of chronic conditions.  Immunizations: -Tetanus: Completed in 2018 -Influenza: got at local pharmacy  -Shingles: get at local pharmacy  -Pneumonia: Completed 2015  Diet: Fair diet. She is eating 1 good meal and then a smaller one. She has been raving swets and has been doing ice cream.  Water  Exercise: No regular exercise.  Eye exam: PRN wears readers. Catract surgery 2012  Dental exam: needs updating  Colonoscopy: Completed in 11/13/2007, repeat 10 years.   Does not want to pursue Lung Cancer Screening: Aged out  Pap smear: Hysterectomy, aged out  Mammogram: defers. Does not want to do due to age  DEXA:  does not want to do due to age  Sleep: goes to her room around 10 and then she will go to bed she will get up around 7.  Advance directive:  has POA and living will        Review of Systems  Constitutional:  Negative for chills and fever.  Respiratory:  Positive for shortness of breath.   Cardiovascular:  Negative for chest pain and leg swelling.   Gastrointestinal:  Negative for abdominal pain, blood in stool, constipation, diarrhea, nausea and vomiting.       Daily   Genitourinary:  Negative for dysuria and hematuria.  Neurological:  Negative for dizziness, tingling and headaches.  Psychiatric/Behavioral:  Negative for hallucinations and suicidal ideas.       Objective:     BP 138/88   Pulse 73   Temp 98.1 F (36.7 C) (Oral)   Ht 5' 4.5 (1.638 m)   Wt 179 lb 9.6 oz (81.5 kg)   SpO2 97%   BMI 30.35 kg/m  BP Readings from Last 3 Encounters:  03/07/24 138/88  01/28/24 (!) 140/78  12/25/23 (!) 98/50   Wt Readings from Last 3 Encounters:  03/07/24 179 lb 9.6 oz (81.5 kg)  03/04/24 180 lb (81.6 kg)  03/04/24 180 lb (81.6 kg)    Physical Exam Vitals and nursing note reviewed.  Constitutional:      Appearance: Normal appearance.  HENT:     Right Ear: Tympanic membrane, ear canal and external ear normal.     Left Ear: Tympanic membrane, ear canal and external ear normal.     Mouth/Throat:     Mouth: Mucous membranes are moist.     Pharynx: Oropharynx is clear.  Eyes:     Extraocular Movements: Extraocular movements intact.     Pupils: Pupils are equal, round, and reactive to light.  Cardiovascular:  Rate and Rhythm: Normal rate and regular rhythm.     Pulses: Normal pulses.     Heart sounds: Normal heart sounds.  Pulmonary:     Effort: Pulmonary effort is normal.     Breath sounds: Normal breath sounds.  Abdominal:     General: Bowel sounds are normal. There is no distension.     Palpations: There is no mass.     Tenderness: There is no abdominal tenderness.     Hernia: No hernia is present.  Musculoskeletal:     Right lower leg: No edema.     Left lower leg: No edema.  Lymphadenopathy:     Cervical: No cervical adenopathy.  Skin:    General: Skin is warm.  Neurological:     General: No focal deficit present.     Mental Status: She is alert.     Deep Tendon Reflexes:     Reflex Scores:       Bicep reflexes are 2+ on the right side and 2+ on the left side.      Patellar reflexes are 2+ on the right side and 2+ on the left side.    Comments: Bilateral upper and lower extremity strength 5/5  Psychiatric:        Mood and Affect: Mood normal.        Behavior: Behavior normal.        Thought Content: Thought content normal.        Judgment: Judgment normal.      No results found for any visits on 03/07/24.    The ASCVD Risk score (Arnett DK, et al., 2019) failed to calculate for the following reasons:   The 2019 ASCVD risk score is only valid for ages 24 to 31   * - Cholesterol units were assumed    Assessment & Plan:   Problem List Items Addressed This Visit       Cardiovascular and Mediastinum   Essential hypertension   Patient currently maintained daily.  Blood pressure controlled in office.  Patient tolerates medication well.  Continue medication as prescribed follow-up with cardiologist as recommended      Relevant Orders   TSH   Coronary artery disease involving native coronary artery of native heart with unstable angina pectoris (HCC)   Followed by cardiology status post CABG.  Recently went under nuc med stress test.  Patient on rosuvastatin  20 mg daily and ezetimibe  10 mg daily.  Patient's lipid panel up-to-date.        Respiratory   Mild intermittent asthma without complication   History of the same.  Patient currently on Wixela 1 puff twice daily along with albuterol  as needed.  Stable continue medication as prescribed        Digestive   Gastroesophageal reflux disease   Patient currently maintained on Aciphex  20 mg daily.  Well-controlled continue        Endocrine   Acquired hypothyroidism   History of the same.  Patient currently maintained on levothyroxine  75 mcg daily.  Pending thyroid  panel today      Relevant Orders   TSH     Other   Hypercalcemia - Primary   Most recent chemistry showed hypercalcemia will check PTH, intact calcium ,  vitamin D.      Relevant Orders   PTH, intact and calcium    VITAMIN D 25 Hydroxy (Vit-D Deficiency, Fractures)   Restless leg   Will check TSH and iron levels.  Will start gabapentin  1 to 300 mg  nightly.      Relevant Medications   gabapentin  (NEURONTIN ) 100 MG capsule   Other Relevant Orders   Iron, TIBC and Ferritin Panel    Return in about 6 months (around 09/05/2024) for BP recheck.    Adina Crandall, NP  "

## 2024-03-08 LAB — TSH: TSH: 0.59 m[IU]/L (ref 0.40–4.50)

## 2024-03-08 LAB — IRON,TIBC AND FERRITIN PANEL
%SAT: 15 % — ABNORMAL LOW (ref 16–45)
Ferritin: 17 ng/mL (ref 16–288)
Iron: 60 ug/dL (ref 45–160)
TIBC: 400 ug/dL (ref 250–450)

## 2024-03-08 LAB — PTH, INTACT AND CALCIUM
Calcium: 9.5 mg/dL (ref 8.6–10.4)
PTH: 31 pg/mL (ref 16–77)

## 2024-03-08 LAB — VITAMIN D 25 HYDROXY (VIT D DEFICIENCY, FRACTURES): Vit D, 25-Hydroxy: 32 ng/mL (ref 30–100)

## 2024-03-10 ENCOUNTER — Ambulatory Visit: Payer: Self-pay | Admitting: Nurse Practitioner

## 2024-04-04 ENCOUNTER — Other Ambulatory Visit: Payer: Self-pay | Admitting: Nurse Practitioner

## 2024-04-04 DIAGNOSIS — E039 Hypothyroidism, unspecified: Secondary | ICD-10-CM

## 2024-04-04 DIAGNOSIS — G2581 Restless legs syndrome: Secondary | ICD-10-CM

## 2024-04-04 MED ORDER — LEVOTHYROXINE SODIUM 75 MCG PO TABS: 75.0000 ug | ORAL_TABLET | Freq: Every day | ORAL | 0 refills | Status: AC

## 2024-04-04 NOTE — Telephone Encounter (Signed)
 Called patient she has been taking the 200mg  for a few days and will be going up to the 300mg  soon she will need refill. She also request refill on her Synthroid . I did verify she is taking as directed.

## 2024-08-01 ENCOUNTER — Ambulatory Visit: Admitting: Physician Assistant

## 2025-03-11 ENCOUNTER — Encounter: Admitting: Nurse Practitioner

## 2025-03-11 ENCOUNTER — Ambulatory Visit
